# Patient Record
Sex: Female | Born: 1940
Health system: Southern US, Community
[De-identification: ages and names within clinical notes are randomized; demographics above are authoritative.]

## PROBLEM LIST (undated history)

## (undated) DIAGNOSIS — R55 Syncope and collapse: Secondary | ICD-10-CM

## (undated) DIAGNOSIS — M810 Age-related osteoporosis without current pathological fracture: Secondary | ICD-10-CM

## (undated) DIAGNOSIS — G309 Alzheimer's disease, unspecified: Secondary | ICD-10-CM

## (undated) DIAGNOSIS — M8580 Other specified disorders of bone density and structure, unspecified site: Secondary | ICD-10-CM

## (undated) DIAGNOSIS — R3981 Functional urinary incontinence: Secondary | ICD-10-CM

## (undated) DIAGNOSIS — R296 Repeated falls: Secondary | ICD-10-CM

## (undated) DIAGNOSIS — E785 Hyperlipidemia, unspecified: Secondary | ICD-10-CM

## (undated) DIAGNOSIS — F341 Dysthymic disorder: Secondary | ICD-10-CM

## (undated) DIAGNOSIS — F32A Depression, unspecified: Secondary | ICD-10-CM

## (undated) DIAGNOSIS — M1710 Unilateral primary osteoarthritis, unspecified knee: Secondary | ICD-10-CM

## (undated) DIAGNOSIS — F068 Other specified mental disorders due to known physiological condition: Secondary | ICD-10-CM

## (undated) DIAGNOSIS — F1010 Alcohol abuse, uncomplicated: Secondary | ICD-10-CM

## (undated) DIAGNOSIS — F0280 Dementia in other diseases classified elsewhere without behavioral disturbance: Secondary | ICD-10-CM

## (undated) DIAGNOSIS — K219 Gastro-esophageal reflux disease without esophagitis: Secondary | ICD-10-CM

## (undated) DIAGNOSIS — F329 Major depressive disorder, single episode, unspecified: Secondary | ICD-10-CM

## (undated) DIAGNOSIS — R413 Other amnesia: Secondary | ICD-10-CM

## (undated) DIAGNOSIS — I959 Hypotension, unspecified: Secondary | ICD-10-CM

## (undated) DIAGNOSIS — Z87442 Personal history of urinary calculi: Secondary | ICD-10-CM

## (undated) DIAGNOSIS — M25531 Pain in right wrist: Secondary | ICD-10-CM

## (undated) DIAGNOSIS — K52839 Microscopic colitis, unspecified: Secondary | ICD-10-CM

## (undated) DIAGNOSIS — Z8639 Personal history of other endocrine, nutritional and metabolic disease: Secondary | ICD-10-CM

## (undated) DIAGNOSIS — M1990 Unspecified osteoarthritis, unspecified site: Secondary | ICD-10-CM

## (undated) DIAGNOSIS — R2681 Unsteadiness on feet: Secondary | ICD-10-CM

## (undated) HISTORY — PX: TOTAL KNEE ARTHROPLASTY: SHX125

## (undated) HISTORY — DX: Other specified disorders of bone density and structure, unspecified site: M85.80

## (undated) HISTORY — DX: Microscopic colitis, unspecified: K52.839

## (undated) HISTORY — PX: TONSILLECTOMY: SHX5217

## (undated) HISTORY — DX: Other amnesia: R41.3

## (undated) HISTORY — DX: Unilateral primary osteoarthritis, unspecified knee: M17.10

## (undated) HISTORY — DX: Pain in right wrist: M25.531

## (undated) HISTORY — DX: Other specified mental disorders due to known physiological condition: F06.8

## (undated) HISTORY — DX: Dysthymic disorder: F34.1

## (undated) HISTORY — DX: Alcohol abuse, uncomplicated: F10.10

## (undated) HISTORY — DX: Unspecified osteoarthritis, unspecified site: M19.90

## (undated) HISTORY — DX: Dementia in other diseases classified elsewhere without behavioral disturbance: F02.80

## (undated) HISTORY — DX: Major depressive disorder, single episode, unspecified: F32.9

## (undated) HISTORY — PX: JOINT REPLACEMENT: SHX530

## (undated) HISTORY — DX: Alzheimer's disease, unspecified: G30.9

## (undated) HISTORY — DX: Hyperlipidemia, unspecified: E78.5

## (undated) HISTORY — PX: DILATION AND CURETTAGE OF UTERUS: SHX78

## (undated) HISTORY — DX: Age-related osteoporosis without current pathological fracture: M81.0

## (undated) HISTORY — DX: Gastro-esophageal reflux disease without esophagitis: K21.9

## (undated) HISTORY — DX: Depression, unspecified: F32.A

---

## 1998-01-09 ENCOUNTER — Ambulatory Visit (HOSPITAL_COMMUNITY): Admission: RE | Admit: 1998-01-09 | Discharge: 1998-01-09 | Payer: Self-pay | Admitting: Family Medicine

## 1998-08-09 ENCOUNTER — Other Ambulatory Visit: Admission: RE | Admit: 1998-08-09 | Discharge: 1998-08-09 | Payer: Self-pay | Admitting: Family Medicine

## 1998-10-10 ENCOUNTER — Emergency Department (HOSPITAL_COMMUNITY): Admission: EM | Admit: 1998-10-10 | Discharge: 1998-10-10 | Payer: Self-pay | Admitting: Emergency Medicine

## 1998-10-11 ENCOUNTER — Inpatient Hospital Stay (HOSPITAL_COMMUNITY): Admission: EM | Admit: 1998-10-11 | Discharge: 1998-10-17 | Payer: Self-pay | Admitting: Emergency Medicine

## 1998-10-11 ENCOUNTER — Encounter: Payer: Self-pay | Admitting: Neurology

## 1998-11-15 ENCOUNTER — Other Ambulatory Visit: Admission: RE | Admit: 1998-11-15 | Discharge: 1998-11-15 | Payer: Self-pay | Admitting: Gastroenterology

## 1999-01-01 ENCOUNTER — Other Ambulatory Visit: Admission: RE | Admit: 1999-01-01 | Discharge: 1999-01-01 | Payer: Self-pay | Admitting: Gastroenterology

## 1999-07-27 ENCOUNTER — Other Ambulatory Visit: Admission: RE | Admit: 1999-07-27 | Discharge: 1999-07-27 | Payer: Self-pay | Admitting: Family Medicine

## 1999-09-14 ENCOUNTER — Encounter: Admission: RE | Admit: 1999-09-14 | Discharge: 1999-09-14 | Payer: Self-pay | Admitting: Family Medicine

## 1999-09-14 ENCOUNTER — Encounter: Payer: Self-pay | Admitting: Family Medicine

## 1999-10-31 ENCOUNTER — Emergency Department (HOSPITAL_COMMUNITY): Admission: EM | Admit: 1999-10-31 | Discharge: 1999-10-31 | Payer: Self-pay | Admitting: Emergency Medicine

## 2001-01-08 ENCOUNTER — Other Ambulatory Visit: Admission: RE | Admit: 2001-01-08 | Discharge: 2001-01-08 | Payer: Self-pay | Admitting: Family Medicine

## 2001-02-03 ENCOUNTER — Encounter: Admission: RE | Admit: 2001-02-03 | Discharge: 2001-02-03 | Payer: Self-pay | Admitting: Family Medicine

## 2001-02-03 ENCOUNTER — Encounter: Payer: Self-pay | Admitting: Family Medicine

## 2002-01-21 ENCOUNTER — Other Ambulatory Visit: Admission: RE | Admit: 2002-01-21 | Discharge: 2002-01-21 | Payer: Self-pay | Admitting: Family Medicine

## 2002-02-02 ENCOUNTER — Encounter: Admission: RE | Admit: 2002-02-02 | Discharge: 2002-02-02 | Payer: Self-pay | Admitting: Family Medicine

## 2002-02-02 ENCOUNTER — Encounter: Payer: Self-pay | Admitting: Family Medicine

## 2002-09-09 ENCOUNTER — Inpatient Hospital Stay (HOSPITAL_COMMUNITY): Admission: EM | Admit: 2002-09-09 | Discharge: 2002-09-10 | Payer: Self-pay | Admitting: Emergency Medicine

## 2002-09-09 ENCOUNTER — Encounter: Payer: Self-pay | Admitting: Family Medicine

## 2002-10-11 ENCOUNTER — Encounter: Admission: RE | Admit: 2002-10-11 | Discharge: 2002-10-11 | Payer: Self-pay | Admitting: Family Medicine

## 2002-10-11 ENCOUNTER — Encounter: Payer: Self-pay | Admitting: Family Medicine

## 2003-01-27 ENCOUNTER — Other Ambulatory Visit: Admission: RE | Admit: 2003-01-27 | Discharge: 2003-01-27 | Payer: Self-pay | Admitting: Family Medicine

## 2003-02-04 ENCOUNTER — Encounter: Admission: RE | Admit: 2003-02-04 | Discharge: 2003-02-04 | Payer: Self-pay | Admitting: Family Medicine

## 2003-02-04 ENCOUNTER — Encounter: Payer: Self-pay | Admitting: Family Medicine

## 2004-02-02 ENCOUNTER — Other Ambulatory Visit: Admission: RE | Admit: 2004-02-02 | Discharge: 2004-02-02 | Payer: Self-pay | Admitting: Family Medicine

## 2005-06-18 ENCOUNTER — Ambulatory Visit: Payer: Self-pay | Admitting: Family Medicine

## 2005-06-25 ENCOUNTER — Encounter: Payer: Self-pay | Admitting: Family Medicine

## 2005-06-25 ENCOUNTER — Ambulatory Visit: Payer: Self-pay | Admitting: Family Medicine

## 2005-06-25 ENCOUNTER — Other Ambulatory Visit: Admission: RE | Admit: 2005-06-25 | Discharge: 2005-06-25 | Payer: Self-pay | Admitting: Family Medicine

## 2005-07-23 ENCOUNTER — Ambulatory Visit: Payer: Self-pay | Admitting: Family Medicine

## 2005-10-29 ENCOUNTER — Ambulatory Visit: Payer: Self-pay | Admitting: Family Medicine

## 2005-11-08 ENCOUNTER — Ambulatory Visit: Payer: Self-pay | Admitting: Family Medicine

## 2005-12-06 ENCOUNTER — Ambulatory Visit: Payer: Self-pay | Admitting: Family Medicine

## 2005-12-09 ENCOUNTER — Ambulatory Visit: Payer: Self-pay | Admitting: Family Medicine

## 2006-06-26 ENCOUNTER — Ambulatory Visit: Payer: Self-pay | Admitting: Family Medicine

## 2006-06-27 ENCOUNTER — Ambulatory Visit: Payer: Self-pay | Admitting: Family Medicine

## 2006-06-27 LAB — CONVERTED CEMR LAB
Albumin: 3.7 g/dL (ref 3.5–5.2)
Alkaline Phosphatase: 71 units/L (ref 39–117)
Basophils Absolute: 0.1 10*3/uL (ref 0.0–0.1)
CO2: 30 meq/L (ref 19–32)
Calcium: 9.4 mg/dL (ref 8.4–10.5)
Cholesterol: 215 mg/dL (ref 0–200)
Eosinophil percent: 5.5 % — ABNORMAL HIGH (ref 0.0–5.0)
GFR calc non Af Amer: 89 mL/min
Glucose, Bld: 89 mg/dL (ref 70–99)
HDL: 65.4 mg/dL (ref >39.0)
Hemoglobin: 13.1 g/dL (ref 12.0–15.0)
LDL DIRECT: 137 mg/dL
Lymphocytes Relative: 30 % (ref 12.0–46.0)
MCV: 95.5 fL (ref 78.0–100.0)
Monocytes Absolute: 0.5 10*3/uL (ref 0.2–0.7)
Neutro Abs: 2.4 10*3/uL (ref 1.4–7.7)
Potassium: 4.4 meq/L (ref 3.5–5.1)
RBC: 4.09 M/uL (ref 3.87–5.11)
RDW: 11.2 % — ABNORMAL LOW (ref 11.5–14.6)
Sodium: 141 meq/L (ref 135–145)
Total Protein: 6.8 g/dL (ref 6.0–8.3)
Triglyceride fasting, serum: 60 mg/dL (ref 0–149)
WBC: 4.7 10*3/uL (ref 4.5–10.5)

## 2006-07-24 ENCOUNTER — Encounter: Payer: Self-pay | Admitting: Family Medicine

## 2006-07-24 ENCOUNTER — Ambulatory Visit: Payer: Self-pay | Admitting: Family Medicine

## 2006-07-24 ENCOUNTER — Other Ambulatory Visit: Admission: RE | Admit: 2006-07-24 | Discharge: 2006-07-24 | Payer: Self-pay | Admitting: Family Medicine

## 2006-07-31 ENCOUNTER — Ambulatory Visit: Payer: Self-pay | Admitting: Family Medicine

## 2006-10-22 ENCOUNTER — Ambulatory Visit: Payer: Self-pay | Admitting: Family Medicine

## 2007-05-07 ENCOUNTER — Encounter: Admission: RE | Admit: 2007-05-07 | Discharge: 2007-05-28 | Payer: Self-pay | Admitting: Orthopedic Surgery

## 2007-05-11 DIAGNOSIS — K219 Gastro-esophageal reflux disease without esophagitis: Secondary | ICD-10-CM

## 2007-05-11 DIAGNOSIS — F329 Major depressive disorder, single episode, unspecified: Secondary | ICD-10-CM | POA: Insufficient documentation

## 2007-05-11 HISTORY — DX: Gastro-esophageal reflux disease without esophagitis: K21.9

## 2007-06-05 ENCOUNTER — Telehealth: Payer: Self-pay | Admitting: Family Medicine

## 2007-06-10 ENCOUNTER — Ambulatory Visit: Payer: Self-pay | Admitting: Family Medicine

## 2007-07-02 ENCOUNTER — Telehealth: Payer: Self-pay | Admitting: Family Medicine

## 2007-07-06 ENCOUNTER — Telehealth: Payer: Self-pay | Admitting: Family Medicine

## 2007-07-07 ENCOUNTER — Ambulatory Visit: Payer: Self-pay | Admitting: Family Medicine

## 2007-07-31 ENCOUNTER — Inpatient Hospital Stay (HOSPITAL_COMMUNITY): Admission: RE | Admit: 2007-07-31 | Discharge: 2007-08-04 | Payer: Self-pay | Admitting: Orthopedic Surgery

## 2007-08-27 ENCOUNTER — Encounter: Admission: RE | Admit: 2007-08-27 | Discharge: 2007-10-09 | Payer: Self-pay | Admitting: Orthopedic Surgery

## 2007-09-07 ENCOUNTER — Ambulatory Visit: Payer: Self-pay | Admitting: Family Medicine

## 2007-11-23 ENCOUNTER — Telehealth (INDEPENDENT_AMBULATORY_CARE_PROVIDER_SITE_OTHER): Payer: Self-pay | Admitting: *Deleted

## 2007-12-16 ENCOUNTER — Encounter: Payer: Self-pay | Admitting: Family Medicine

## 2007-12-17 ENCOUNTER — Telehealth: Payer: Self-pay | Admitting: Family Medicine

## 2007-12-17 ENCOUNTER — Other Ambulatory Visit: Admission: RE | Admit: 2007-12-17 | Discharge: 2007-12-17 | Payer: Self-pay | Admitting: Family Medicine

## 2007-12-17 ENCOUNTER — Encounter: Payer: Self-pay | Admitting: Family Medicine

## 2007-12-17 ENCOUNTER — Ambulatory Visit: Payer: Self-pay | Admitting: Family Medicine

## 2007-12-17 DIAGNOSIS — E785 Hyperlipidemia, unspecified: Secondary | ICD-10-CM | POA: Insufficient documentation

## 2007-12-17 DIAGNOSIS — R413 Other amnesia: Secondary | ICD-10-CM | POA: Insufficient documentation

## 2007-12-17 DIAGNOSIS — M810 Age-related osteoporosis without current pathological fracture: Secondary | ICD-10-CM

## 2007-12-17 HISTORY — DX: Age-related osteoporosis without current pathological fracture: M81.0

## 2007-12-17 LAB — CONVERTED CEMR LAB
AST: 18 units/L (ref 0–37)
Alkaline Phosphatase: 61 units/L (ref 39–117)
BUN: 12 mg/dL (ref 6–23)
Basophils Absolute: 0 10*3/uL (ref 0.0–0.1)
Bilirubin Urine: NEGATIVE
CO2: 30 meq/L (ref 19–32)
Cholesterol: 339 mg/dL (ref 0–200)
Creatinine, Ser: 0.7 mg/dL (ref 0.4–1.2)
Direct LDL: 251.6 mg/dL
Eosinophils Absolute: 0.1 10*3/uL (ref 0.0–0.7)
Eosinophils Relative: 3.4 % (ref 0.0–5.0)
Folate: 8 ng/mL
Glucose, Bld: 88 mg/dL (ref 70–99)
Glucose, Urine, Semiquant: NEGATIVE
HDL: 69.2 mg/dL (ref >39.0)
Hemoglobin: 13 g/dL (ref 12.0–15.0)
MCHC: 32.8 g/dL (ref 30.0–36.0)
MCV: 91.4 fL (ref 78.0–100.0)
Potassium: 4.3 meq/L (ref 3.5–5.1)
RBC: 4.33 M/uL (ref 3.87–5.11)
Sodium: 138 meq/L (ref 135–145)
T3, Free: 3 pg/mL (ref 2.3–4.2)
T4, Total: 7.5 ug/dL (ref 5.0–12.5)
Total CHOL/HDL Ratio: 4.9
Triglycerides: 68 mg/dL (ref 0–149)
VLDL: 14 mg/dL (ref 0–40)
Vitamin B-12: 316 pg/mL (ref 211–911)
WBC: 3.7 10*3/uL — ABNORMAL LOW (ref 4.5–10.5)

## 2007-12-25 ENCOUNTER — Encounter: Admission: RE | Admit: 2007-12-25 | Discharge: 2007-12-25 | Payer: Self-pay | Admitting: Family Medicine

## 2008-01-14 ENCOUNTER — Ambulatory Visit: Payer: Self-pay | Admitting: Gastroenterology

## 2008-01-15 ENCOUNTER — Ambulatory Visit: Payer: Self-pay | Admitting: Family Medicine

## 2008-01-15 DIAGNOSIS — E7800 Pure hypercholesterolemia, unspecified: Secondary | ICD-10-CM | POA: Insufficient documentation

## 2008-01-15 LAB — CONVERTED CEMR LAB
Direct LDL: 127.9 mg/dL
HDL: 65 mg/dL (ref >39.0)
Triglycerides: 55 mg/dL (ref 0–149)

## 2008-01-19 ENCOUNTER — Telehealth (INDEPENDENT_AMBULATORY_CARE_PROVIDER_SITE_OTHER): Payer: Self-pay | Admitting: *Deleted

## 2008-01-22 ENCOUNTER — Ambulatory Visit: Payer: Self-pay | Admitting: Family Medicine

## 2008-05-24 ENCOUNTER — Ambulatory Visit: Payer: Self-pay | Admitting: Family Medicine

## 2008-07-20 ENCOUNTER — Ambulatory Visit: Payer: Self-pay | Admitting: Family Medicine

## 2008-07-20 DIAGNOSIS — N390 Urinary tract infection, site not specified: Secondary | ICD-10-CM | POA: Insufficient documentation

## 2008-07-20 DIAGNOSIS — F068 Other specified mental disorders due to known physiological condition: Secondary | ICD-10-CM

## 2008-07-20 HISTORY — DX: Other specified mental disorders due to known physiological condition: F06.8

## 2008-07-20 LAB — CONVERTED CEMR LAB
Bilirubin Urine: NEGATIVE
Protein, U semiquant: 30
Urobilinogen, UA: 0.2
pH: 6

## 2008-08-01 ENCOUNTER — Ambulatory Visit: Payer: Self-pay | Admitting: Family Medicine

## 2008-08-01 LAB — CONVERTED CEMR LAB
Bilirubin Urine: NEGATIVE
Ketones, urine, test strip: NEGATIVE
Urobilinogen, UA: 0.2
pH: 6.5

## 2008-08-22 ENCOUNTER — Encounter: Payer: Self-pay | Admitting: Family Medicine

## 2008-08-22 ENCOUNTER — Ambulatory Visit: Payer: Self-pay | Admitting: Internal Medicine

## 2008-09-15 ENCOUNTER — Telehealth: Payer: Self-pay | Admitting: *Deleted

## 2008-12-26 ENCOUNTER — Encounter: Admission: RE | Admit: 2008-12-26 | Discharge: 2008-12-26 | Payer: Self-pay | Admitting: Family Medicine

## 2008-12-26 LAB — HM MAMMOGRAPHY: HM Mammogram: NEGATIVE

## 2010-01-09 ENCOUNTER — Encounter: Admission: RE | Admit: 2010-01-09 | Discharge: 2010-01-09 | Payer: Self-pay | Admitting: Physician Assistant

## 2010-01-26 ENCOUNTER — Emergency Department (HOSPITAL_COMMUNITY): Admission: EM | Admit: 2010-01-26 | Discharge: 2010-01-26 | Payer: Self-pay | Admitting: Emergency Medicine

## 2010-05-23 ENCOUNTER — Ambulatory Visit: Payer: Self-pay | Admitting: Family Medicine

## 2010-05-23 DIAGNOSIS — F0281 Dementia in other diseases classified elsewhere with behavioral disturbance: Secondary | ICD-10-CM

## 2010-05-23 DIAGNOSIS — F325 Major depressive disorder, single episode, in full remission: Secondary | ICD-10-CM | POA: Insufficient documentation

## 2010-05-23 DIAGNOSIS — F341 Dysthymic disorder: Secondary | ICD-10-CM

## 2010-05-23 DIAGNOSIS — E782 Mixed hyperlipidemia: Secondary | ICD-10-CM | POA: Insufficient documentation

## 2010-05-23 DIAGNOSIS — M818 Other osteoporosis without current pathological fracture: Secondary | ICD-10-CM | POA: Insufficient documentation

## 2010-05-23 DIAGNOSIS — F0280 Dementia in other diseases classified elsewhere without behavioral disturbance: Secondary | ICD-10-CM

## 2010-05-23 DIAGNOSIS — G301 Alzheimer's disease with late onset: Secondary | ICD-10-CM

## 2010-05-23 DIAGNOSIS — IMO0002 Reserved for concepts with insufficient information to code with codable children: Secondary | ICD-10-CM

## 2010-05-23 HISTORY — DX: Dementia in other diseases classified elsewhere, unspecified severity, without behavioral disturbance, psychotic disturbance, mood disturbance, and anxiety: F02.80

## 2010-05-23 HISTORY — DX: Dysthymic disorder: F34.1

## 2010-05-23 HISTORY — DX: Reserved for concepts with insufficient information to code with codable children: IMO0002

## 2010-06-04 ENCOUNTER — Encounter (INDEPENDENT_AMBULATORY_CARE_PROVIDER_SITE_OTHER): Payer: Self-pay | Admitting: *Deleted

## 2010-06-26 ENCOUNTER — Encounter (INDEPENDENT_AMBULATORY_CARE_PROVIDER_SITE_OTHER): Payer: Self-pay | Admitting: *Deleted

## 2010-07-02 ENCOUNTER — Ambulatory Visit: Payer: Self-pay | Admitting: Gastroenterology

## 2010-07-16 ENCOUNTER — Ambulatory Visit: Payer: Self-pay | Admitting: Gastroenterology

## 2010-09-09 ENCOUNTER — Encounter: Payer: Self-pay | Admitting: Family Medicine

## 2010-09-18 NOTE — Miscellaneous (Signed)
Summary: [facility]  Clinical Lists Changes  Medications: Added new medication of MOVIPREP 100 GM  SOLR (PEG-KCL-NACL-NASULF-NA ASC-C) As per prep instructions. - Signed Rx of MOVIPREP 100 GM  SOLR (PEG-KCL-NACL-NASULF-NA ASC-C) As per prep instructions.;  #1 x 0;  Signed;  Entered by: Ezra Sites RN;  Authorized by: Louis Meckel MD;  Method used: [facility] [phone]*, [address] [place], [place]  [address], Ph: [phone] or [phone], Fax: [phone] Observations: Added new observation of NKA: T (07/02/2010 12:43)    Prescriptions: MOVIPREP 100 GM  SOLR (PEG-KCL-NACL-NASULF-NA ASC-C) As per prep instructions.  #1 x 0   Entered by:   Ezra Sites RN   Authorized by:   Louis Meckel MD   Signed by:   Ezra Sites RN on 07/02/2010   Method used:   [facility]ronically to        [facility] [phone]* (retail)       [address]       [place], [place]  [address]       Ph: [phone] or [phone]       Fax: [phone]   RxID:   [id number]

## 2010-09-18 NOTE — Assessment & Plan Note (Signed)
Summary: BRAND NEW PT/TO EST/OK PER DR TODD AND DR BLYTH/CJR   Vital Signs:  Patient profile:   70 year old female Height:      62.5 inches (158.75 cm) Weight:      126.31 pounds (57.41 kg) BMI:     22.82 O2 Sat:      98 % on Room air Temp:     97.9 degrees F (36.61 degrees C) oral Pulse rate:   75 / minute BP sitting:   112 / 64  (left arm) Cuff size:   regular  Vitals Entered By: Josph Macho RMA (May 23, 2010 2:41 PM)  O2 Flow:  Room air CC: Establish new patient/ Was seeing Dr Tawanna Cooler and wanted a women/ CF Is Patient Diabetic? No   History of Present Illness: Patient in today for new patient appointment. Her last physician was at [facility] and that physician has left the area so she is in need of a new physician. No acute c/o. She is aware of the fact that it has been almost 10 years since her last colonoscopy and she is in need of another one. Would like to have this colonoscopy done with [place]. She believes she is essentially up to date with her other health screening exams and we will request records to confirm She has had both the pneumonia and the Shingles shot. She feels well today. No recent illness/f/c/cough.congestion/fatigue/CP/palp/SOB/GI or GU c/o. She has been struggling with dementia for years and is on Donepezil. She sees a neurologist but does not remember her name. She says she has good days and bad days but still manages to live at home with her husband. Her only other concern is that she reports she has had 4 falls in the past 1 1/2 years for unclear reasons. She reports she has no warning, she is just walking and she falls forward. She feels maybe she catches her feet on something but she is unsure. She denies any vertigo/pain/weakness/presyncope/syncope/confusion and cannot explain why this happens  Preventive Screening-Counseling & Management  Alcohol-Tobacco     Smoking Status: never      Drug Use:  no.    Current Problems (verified): 1)   Accidental Falls, Recurrent  (ICD-E888.9) 2)  Osteoarthritis, Knee  (ICD-715.96) 3)  Anxiety Depression  (ICD-300.4) 4)  Other Osteoporosis  (ICD-733.09) 5)  Mixed Hyperlipidemia  (ICD-272.2) 6)  Alzheimer's Disease  (ICD-331.0)  Current Medications (verified): 1)  Donepezil Hcl 10 Mg Tabs (Donepezil Hcl) .Marland Kitchen.. 1 Tab By Mouth Daily 2)  Alendronate Sodium 70 Mg Tabs (Alendronate Sodium) .... Take 1 Tablet By Mouth Q Week 3)  Simvastatin 20 Mg Tabs (Simvastatin) .... Take 1 Tablet By Mouth At Bedtime 4)  Citalopram Hydrobromide 40 Mg Tabs (Citalopram Hydrobromide) .... Take 1/2 Tablet Once Daily 5)  Advil .... 2 Daily 6)  Calcium 600 .... 2 Daily 7)  D3- 1000 Units .Marland KitchenMarland Kitchen. 1 Daily  Allergies (verified): No Known Drug Allergies  Past History:  Past Surgical History: Total knee replacement, right Tonsillectomy  Family History: Father: deceased@37 , accidental death Mother: deceased@86 , anemia, smoker, Alzheimer's Dementia, emphysema Siblings:  M1/2 sister: 96, morbidly obese M1/2sister: 59, cigarette smoker, emphysema M1/2 brother: 76, cigarette smoker, CAD s/p MI M1/2 brother: 46, [employer] MGM: deceased@88 , old age MGF: deceased@90 , old age PGM: deceased older in 77s PGF: deceased older in 61s Children: Daughter: 77, obese, poor health, depression Son: 70, alcoholism, depression, violent  Social History: Never Smoked Married Retired  worked at [employer] and [employer]  [employer] Alcohol use-no, quit because she had a problem with alcohol and has abstained for years Drug use-no Uses seat belt regularlySmoking Status:  never Drug Use:  no  Review of Systems  The patient denies anorexia, fever, weight loss, weight gain, vision loss, decreased hearing, hoarseness, chest pain, syncope, dyspnea on exertion, peripheral edema, prolonged cough, headaches, hemoptysis, abdominal pain, melena, hematochezia, severe indigestion/heartburn, hematuria, incontinence, muscle weakness, suspicious skin lesions,  transient blindness, difficulty walking, depression, unusual weight change, abnormal bleeding, and enlarged lymph nodes.         Flu Vaccine Consent Questions     Do you have a history of severe allergic reactions to this vaccine? no    Any prior history of allergic reactions to egg and/or gelatin? no    Do you have a sensitivity to the preservative Thimersol? no    Do you have a past history of Guillan-Barre Syndrome? no    Do you currently have an acute febrile illness? no    Have you ever had a severe reaction to latex? no    Vaccine information given and explained to patient? yes    Are you currently pregnant? no    Lot Number:[id number]   Exp Date:02/16/2011   Site Given  Left Deltoid IM Josph Macho RMA  May 23, 2010 2:59 PM   Physical Exam  General:  Well-developed,well-nourished,in no acute distress; alert,appropriate and cooperative throughout examination Head:  Normocephalic and atraumatic without obvious abnormalities. No apparent alopecia or balding. Eyes:  No corneal or conjunctival inflammation noted. EOMI. Perrla. Funduscopic exam benign, without hemorrhages, exudates or papilledema. Vision grossly normal. Ears:  External ear exam shows no significant lesions or deformities.  Otoscopic examination reveals clear canals, tympanic membranes are intact bilaterally without bulging, retraction, inflammation or discharge. Hearing is grossly normal bilaterally. Nose:  External nasal examination shows no deformity or inflammation. Nasal mucosa are pink and moist without lesions or exudates. Mouth:  Oral mucosa and oropharynx without lesions or exudates.  Teeth in good repair. Neck:  No deformities, masses, or tenderness noted. Lungs:  Normal respiratory effort, chest expands symmetrically. Lungs are clear to auscultation, no crackles or wheezes. Heart:  Normal rate and regular rhythm. S1 and S2 normal without gallop, murmur, click, rub or other extra sounds. Abdomen:  Bowel  sounds positive,abdomen soft and non-tender without masses, organomegaly or hernias noted. Msk:  No deformity or scoliosis noted of thoracic or lumbar spine.   Pulses:  R and L carotid, dorsalis pedis pulses are full and equal bilaterally Extremities:  No clubbing, cyanosis, edema, or deformity noted with normal full range of motion of all joints.   Neurologic:  No cranial nerve deficits noted. Station and gait are normal. Plantar reflexes are down-going bilaterally. DTRs are symmetrical throughout. Sensory, motor and coordinative functions appear intact.alert & oriented X3 today Skin:  Intact without suspicious lesions or rashes Cervical Nodes:  No lymphadenopathy noted Psych:  Cognition and judgment appear intact. Alert and cooperative with normal attention span and concentration. No apparent delusions, illusions, hallucinations   Impression & Recommendations:  Problem # 1:  ACCIDENTAL FALLS, RECURRENT (ICD-E888.9) Unclear etiology, will request records from previous physician and patient will supply [place] with her neurologists name so we can consider a referral for evaluation if this happens again. Will also check some labs including TSH, CBC to further evaluate  Problem # 2:  MIXED HYPERLIPIDEMIA (ICD-272.2)  His updated medication list for this problem includes:    Simvastatin 20 Mg Tabs (  Simvastatin) .Marland Kitchen... Take 1 tablet by mouth at bedtime Check an FLP prior to next visit. Avoid trans fats  Problem # 3:  OTHER OSTEOPOROSIS (ICD-733.09)  His updated medication list for this problem includes:    Alendronate Sodium 70 Mg Tabs (Alendronate sodium) .Marland Kitchen... Take 1 tablet by mouth q week Await records to evaluate when next bone density study is due  Problem # 4:  ALZHEIMER'S DISEASE (ICD-331.0) Continue Donepezil and f/u with Neurology  Problem # 5:  Preventive Health Care (ICD-V70.0)  Will refer for screening colonoscopy  Orders: Gastroenterology Referral (GI)  Complete Medication  List: 1)  Donepezil Hcl 10 Mg Tabs (Donepezil hcl) .Marland Kitchen.. 1 tab by mouth daily 2)  Alendronate Sodium 70 Mg Tabs (Alendronate sodium) .... Take 1 tablet by mouth q week 3)  Simvastatin 20 Mg Tabs (Simvastatin) .... Take 1 tablet by mouth at bedtime 4)  Citalopram Hydrobromide 40 Mg Tabs (Citalopram hydrobromide) .... Take 1/2 tablet once daily 5)  Advil  .... 2 daily 6)  Calcium 600  .... 2 daily 7)  D3- 1000 Units  .Marland Kitchen.. 1 daily  Other Orders: Flu Vaccine 34yrs + MEDICARE PATIENTS (Z6109) Administration Flu vaccine - MCR (U0454)  Patient Instructions: 1)  Please schedule a follow-up appointment in 1 month.  2)  BMP prior to visit, ICD-9: E888.9 3)  Hepatic Panel prior to visit ICD-9: 294.8 4)  Lipid panel prior to visit ICD-9 : 272.4 5)  TSH prior to visit ICD-9 : 294.8 6)  CBC w/ Diff prior to visit ICD-9 : 294.8 7)  Release of Records from [facility]  Preventive Care Screening  Last Flu Shot:    Date:  05/19/2009    Results:  historical   Last Tetanus Booster:    Date:  08/19/2008    Results:  Historical   Last Pneumovax:    Date:  08/19/2004    Results:  historical   Colonoscopy:    Date:  08/19/2000    Results:  historical

## 2010-09-18 NOTE — Letter (Signed)
Summary: [facility] Instructions  [facility]  [address] [place], [place] [address]   Phone: [phone]  Fax: [phone]       Kim Hernandez    [birth year]    MRN: [id number]        Procedure Day /Date:  Monday 07/16/2010     Arrival Time:  9:00 am      Procedure Time: 10:00 am     Location of Procedure:                    _ x_  [facility] (4th Floor)                        PREPARATION FOR COLONOSCOPY WITH MOVIPREP   Starting 5 days prior to your procedure Wednesday 11/28 do not eat nuts, seeds, popcorn, corn, beans, peas,  salads, or any raw vegetables.  Do not take any fiber supplements (e.g. Metamucil, Citrucel, and Benefiber).  THE DAY BEFORE YOUR PROCEDURE         DATE: Sunday 11/27  1.  Drink clear liquids the entire day-NO SOLID FOOD  2.  Do not drink anything colored red or purple.  Avoid juices with pulp.  No orange juice.  3.  Drink at least 64 oz. (8 glasses) of fluid/clear liquids during the day to prevent dehydration and help the prep work efficiently.  CLEAR LIQUIDS INCLUDE: Water Jello Ice Popsicles Tea (sugar ok, no milk/cream) Powdered fruit flavored drinks Coffee (sugar ok, no milk/cream) Gatorade Juice: apple, white grape, white cranberry  Lemonade Clear bullion, consomm, broth Carbonated beverages (any kind) Strained chicken noodle soup Hard Candy                             4.  In the morning, mix first dose of MoviPrep solution:    Empty 1 Pouch A and 1 Pouch B into the disposable container    Add lukewarm drinking water to the top line of the container. Mix to dissolve    Refrigerate (mixed solution should be used within 24 hrs)  5.  Begin drinking the prep at 5:00 p.m. The MoviPrep container is divided by 4 marks.   Every 15 minutes drink the solution down to the next mark (approximately 8 oz) until the full liter is complete.   6.  Follow completed prep with 16 oz of clear liquid of your choice  (Nothing red or purple).  Continue to drink clear liquids until bedtime.  7.  Before going to bed, mix second dose of MoviPrep solution:    Empty 1 Pouch A and 1 Pouch B into the disposable container    Add lukewarm drinking water to the top line of the container. Mix to dissolve    Refrigerate  THE DAY OF YOUR PROCEDURE      DATE: Monday 11/28  Beginning at 5:00 a.m. (5 hours before procedure):         1. Every 15 minutes, drink the solution down to the next mark (approx 8 oz) until the full liter is complete.  2. Follow completed prep with 16 oz. of clear liquid of your choice.    3. You may drink clear liquids until 8:00 am (2 HOURS BEFORE PROCEDURE).   MEDICATION INSTRUCTIONS  Unless otherwise instructed, you should take regular prescription medications with a small sip of water   as early as possible the morning  of your procedure.           OTHER INSTRUCTIONS  You will need a responsible adult at least 71 years of age to accompany you and drive you home.   This person must remain in the waiting room during your procedure.  Wear loose fitting clothing that is easily removed.  Leave jewelry and other valuables at home.  However, you may wish to bring a book to read or  an iPod/MP3 player to listen to music as you wait for your procedure to start.  Remove all body piercing jewelry and leave at home.  Total time from sign-in until discharge is approximately 2-3 hours.  You should go home directly after your procedure and rest.  You can resume normal activities the  day after your procedure.  The day of your procedure you should not:   Drive   Make legal decisions   Operate machinery   Drink alcohol   Return to work  You will receive specific instructions about eating, activities and medications before you leave.    The above instructions have been reviewed and explained to me by   Ezra Sites RN  July 02, 2010 1:29 PM     I fully understand  and can verbalize these instructions _____________________________ Date _________

## 2010-09-18 NOTE — Procedures (Signed)
Summary: Colonoscopy  Patient: Kim Hernandez Note: All result statuses are Final unless otherwise noted.  Tests: (1) Colonoscopy (COL)   COL Colonoscopy           DONE     [facility]     [address]     [place], [place]  [address]           COLONOSCOPY PROCEDURE REPORT           PATIENT:  Kim, Hernandez  MR#:  [id number]     BIRTHDATE:  1941-08-19, 69 yrs. old  GENDER:  female           ENDOSCOPIST:  Barbette Hair. Arlyce Dice, MD     Referred by:           PROCEDURE DATE:  07/16/2010     PROCEDURE:  Diagnostic Colonoscopy     ASA CLASS:  Class II     INDICATIONS:  1) Routine Risk Screening           MEDICATIONS:   Fentanyl 50 mcg IV, Versed 5 mg IV           DESCRIPTION OF PROCEDURE:   After the risks benefits and     alternatives of the procedure were thoroughly explained, informed     consent was obtained.  Digital rectal exam was performed and     revealed no abnormalities.   The LB [id number] endoscope     was introduced through the anus and advanced to the cecum, which     was identified by the ileocecal valve, without limitations.  The     quality of the prep was excellent, using MoviPrep.  The instrument     was then slowly withdrawn as the colon was fully examined.     <<PROCEDUREIMAGES>>           FINDINGS:  A normal appearing cecum, ileocecal valve, and     appendiceal orifice were identified. The ascending, hepatic     flexure, transverse, splenic flexure, descending, sigmoid colon,     and rectum appeared unremarkable (see image1, image3, image4,     image5, image6, and image7).   Retroflexed views in the rectum     revealed no abnormalities.    The time to cecum =  3.25  minutes.     The scope was then withdrawn (time =  7.0  min) from the patient     and the procedure completed.           COMPLICATIONS:  None           ENDOSCOPIC IMPRESSION:     1) Normal colon     RECOMMENDATIONS:     1) Continue current colorectal screening recommendations  for     "routine risk" patients with a repeat colonoscopy in 10 years.           REPEAT EXAM:   10 year(s) Colonoscopy           ______________________________     Barbette Hair. Arlyce Dice, MD           CC: Roderick Pee, MD           n.     Rosalie Doctor:   Barbette Hair. Kaplan at 07/16/2010 10:47 AM           Ina Kick, [id number]  Note: An exclamation mark (!) indicates a result that was not dispersed into the flowsheet. Document Creation Date: 07/16/2010 10:48 AM _______________________________________________________________________  (1)  Order result status: Final Collection or observation date-time: 07/16/2010 10:43 Requested date-time:  Receipt date-time:  Reported date-time:  Referring Physician:   Ordering Physician: Melvia Heaps [phone]) Specimen Source:  Source: Launa Grill Order Number: [id number] Lab site:   Appended Document: Colonoscopy    Clinical Lists Changes  Observations: Added new observation of COLONNXTDUE: 06/2020 (07/16/2010 10:51)

## 2010-09-18 NOTE — Letter (Signed)
Summary: Pre Visit Letter Revised  [facility]  [address] [place], [place] [address]   Phone: [phone]  Fax: [phone]        06/04/2010 MRN: [id number] [facility] [address] [place], [place]  [address]             Procedure Date:  07-16-10   Welcome to the Gastroenterology Division at [facility].    You are scheduled to see a nurse for your pre-procedure visit on 07-02-10 at 1:00P.M. on the 3rd floor at [facility], [address]  We ask that you try to arrive at our office 15 minutes prior to your appointment time to allow for check-in.  Please take a minute to review the attached form.  If you answer "Yes" to one or more of the questions on the first page, we ask that you call the person listed at your earliest opportunity.  If you answer "No" to all of the questions, please complete the rest of the form and bring it to your appointment.    Your nurse visit will consist of discussing your medical and surgical history, your immediate family medical history, and your medications.   If you are unable to list all of your medications on the form, please bring the medication bottles to your appointment and we will list them.  We will need to be aware of both prescribed and over the counter drugs.  We will need to know exact dosage information as well.    Please be prepared to read and sign documents such as consent forms, a financial agreement, and acknowledgement forms.  If necessary, and with your consent, a friend or relative is welcome to sit-in on the nurse visit with you.  Please bring your insurance card so that we may make a copy of it.  If your insurance requires a referral to see a specialist, please bring your referral form from your primary care physician.  No co-pay is required for this nurse visit.     If you cannot keep your appointment, please call [phone] to cancel or reschedule prior to your appointment date.  This  allows [place] the opportunity to schedule an appointment for another patient in need of care.    Thank you for choosing [facility] for your medical needs.  We appreciate the opportunity to care for you.  Please visit [place] at our website  to learn more about our practice.  Sincerely, The Gastroenterology Division

## 2010-11-05 LAB — POCT I-STAT, CHEM 8
Calcium, Ion: 1.13 mmol/L (ref 1.12–1.32)
Creatinine, Ser: 0.8 mg/dL (ref 0.4–1.2)
Glucose, Bld: 115 mg/dL — ABNORMAL HIGH (ref 70–99)
Hemoglobin: 13.9 g/dL (ref 12.0–15.0)

## 2010-11-05 LAB — URINALYSIS, ROUTINE W REFLEX MICROSCOPIC
Bilirubin Urine: NEGATIVE
Ketones, ur: NEGATIVE mg/dL
Leukocytes, UA: NEGATIVE
Nitrite: NEGATIVE
Protein, ur: NEGATIVE mg/dL
Urobilinogen, UA: 0.2 mg/dL (ref 0.0–1.0)

## 2010-11-05 LAB — URINE MICROSCOPIC-ADD ON

## 2010-11-06 ENCOUNTER — Other Ambulatory Visit (HOSPITAL_COMMUNITY): Payer: Self-pay | Admitting: Neurology

## 2010-11-06 DIAGNOSIS — R413 Other amnesia: Secondary | ICD-10-CM

## 2010-11-12 ENCOUNTER — Other Ambulatory Visit: Payer: Self-pay

## 2010-11-12 ENCOUNTER — Ambulatory Visit (HOSPITAL_COMMUNITY)
Admission: RE | Admit: 2010-11-12 | Discharge: 2010-11-12 | Disposition: A | Discharge status: Home / Self Care | Payer: No Typology Code available for payment source | Source: Ambulatory Visit | Attending: Neurology | Admitting: Neurology

## 2010-11-12 DIAGNOSIS — R413 Other amnesia: Secondary | ICD-10-CM | POA: Insufficient documentation

## 2010-11-12 LAB — GLUCOSE, CAPILLARY: Glucose-Capillary: 91 mg/dL (ref 70–99)

## 2010-11-12 MED ORDER — SIMVASTATIN 20 MG PO TABS: 20.0000 mg | ORAL_TABLET | Freq: Every evening | ORAL | Status: DC

## 2010-12-03 ENCOUNTER — Other Ambulatory Visit: Payer: Self-pay

## 2010-12-04 MED ORDER — CITALOPRAM HYDROBROMIDE 40 MG PO TABS: 40.0000 mg | ORAL_TABLET | Freq: Every day | ORAL | Status: DC

## 2010-12-17 ENCOUNTER — Ambulatory Visit: Payer: Medicare Other | Admitting: Family Medicine

## 2011-01-01 NOTE — Discharge Summary (Signed)
NAME:  NALY, SCHWANZ              ACCOUNT NO.:  [id number]   MEDICAL RECORD NO.:  [id number]          PATIENT TYPE:  INP   LOCATION:  1615                         FACILITY:  [facility]   PHYSICIAN:  Ollen Gross, M.D.    DATE OF BIRTH:  29-May-1941   DATE OF ADMISSION:  07/31/2007  DATE OF DISCHARGE:  08/04/2007                               DISCHARGE SUMMARY   ADMITTING DIAGNOSES:  1. Osteoarthritis right knee.  2. History of headaches.  3. Past history of depression.  4. Past history of bronchitis.  5. Past history of pneumonia.  6. History of renal calculi.  7. Osteoporosis.  8. Arthritis.   DISCHARGE DIAGNOSES:  1. Osteoarthritis right knee status post right total knee replacement      arthroplasty.  2. Postoperative acute blood loss anemia (no transfusion).  3. Mild postoperative hyponatremia, improved.  4. History of headaches.  5. Past history of depression.  6. Past history of bronchitis.  7. Past history of pneumonia.  8. History of renal calculi.  9. Osteoporosis.  10.Arthritis.   PROCEDURE:  July 31, 2007:  Right total knee.  Surgeon:  Dr.  Lequita Halt.  Assistant:  Avel Peace, PA-C.  Anesthesia:  Spinal with  Duramorph.   CONSULTS:  None.   BRIEF HISTORY:  Ms. Heckart is a 70 year old female with end-stage  arthritis of the right knee with progressive worsening pain and  dysfunction, failed nonoperative management, now presents for total knee  arthroplasty.   LABORATORY DATA:  Preoperative CBC showed a hemoglobin of 13.5,  hematocrit of 40.5, white cell count 5.2.  Chem panel on admission all  within normal limits.  PT/INR 12.4 and 0.9 preoperatively with an  elevated PTT of 39.  Preoperative UA:  Moderate leukocyte esterase, only  3-6 white cells, 0-2 red cells, and many bacteria.  Serial CBCs were  followed.  Hemoglobin postoperatively 8.9, drifted down to 8.7, then got  as low as 7.8.  She was asymptomatic.  It was coming back up; she was  already back  up to 8.0.  Serial pro times followed.  Last noted PT/INR  17.9 and 1.4.  BMETs were followed.  Sodium did drop down to 131, back  up to 135.  Remaining electrolytes remained within normal limits.   X-rays:  Chest x-ray July 28, 2007:  No active lung disease.   EKG on July 28, 2007:  Normal sinus rhythm, incomplete right bundle-  branch block, unconfirmed.   HOSPITAL COURSE:  The patient was admitted to [facility] and  taken to the OR, underwent above-stated procedure without complication.  The patient tolerated the procedure well, later transferred to the  recovery room and the orthopedic floor.  Started on PCA and p.o.  analgesic for pain control following surgery, had a Autovac Hemovac used  at time of surgery.  Hemovac drain was pulled on postoperative day #1.  Did pretty well on that evening and into the morning of next day,  started getting up out of bed.  Weaned over to p.o. medications.  The  patient was a known Jehovah's Witness and did not  want any blood  products.  She was started on iron.  Postoperative hemoglobin was down  to 8.9.  She was asymptomatic with this, started getting up, and by day  #2 was already walking 150 feet.  Dressing was changed on day #2 and  incision looked good.  No signs of infection, encouraged ambulation,  kept the IV going.  Hemoglobin was down to 8.7.  By day #3 she was  already up walking about 200 feet even though the hemoglobin was down to  7.8.  She was asymptomatic with this.  INR was slowly titrating up to a  therapeutic level.  Due to the hemoglobin being a little bit lower we  decided to hold her another day to monitor hemoglobin and to ensure that  she did not develop any symptomatology with her low hemoglobin.  Rechecked it, and on postoperative day #4 hemoglobin had started coming  back up.  She was back up to 8.  She was tolerating her iron, walking  well, and was discharged home.   DISCHARGE PLAN:  1. The patient  was discharged home on August 04, 2007.  2. Discharge diagnoses:  Please see above.  3. Discharge medications:  Percocet, Robaxin, Nu-Iron and Coumadin.   DIET:  As tolerated.   ACTIVITY:  She is weightbearing as tolerated to the right lower  extremity.  Home health PT and home health nursing, total knee protocol.   FOLLOWUP:  Two weeks.   DISPOSITION:  Home.   CONDITION UPON DISCHARGE:  Improving.      Alexzandrew L. Perkins, P.A.C.      Ollen Gross, M.D.  Electronically Signed   ALP/MEDQ  D:  08/04/2007  T:  08/04/2007  Job:  [id number]   cc:   Tinnie Gens A. Tawanna Cooler, MD  [address] [place]  [place] [address]

## 2011-01-01 NOTE — Op Note (Signed)
NAME:  Kim Hernandez, Kim Hernandez              ACCOUNT NO.:  [id number]   MEDICAL RECORD NO.:  [id number]          PATIENT TYPE:  INP   LOCATION:  1615                         FACILITY:  [facility]   PHYSICIAN:  Ollen Gross, M.D.    DATE OF BIRTH:  Dec 23, 1940   DATE OF PROCEDURE:  DATE OF DISCHARGE:                               OPERATIVE REPORT   ADDENDUM:  The dictation number that the addendum is going to is  [id number].   In addition, the patient had symptomatic arthritis of the left knee and  at the end of the procedure we sterilely prepped the left knee and  injected it with Marcaine and Depo-Medrol with no problems.  A Band-Aid  is placed and she was awakened and transported to recovery in stable  condition.      Ollen Gross, M.D.  Electronically Signed     FA/MEDQ  D:  07/31/2007  T:  07/31/2007  Job:  [id number]

## 2011-01-01 NOTE — Op Note (Signed)
NAME:  Kim Hernandez, Kim Hernandez              ACCOUNT NO.:  [id number]   MEDICAL RECORD NO.:  [id number]          PATIENT TYPE:  INP   LOCATION:  1615                         FACILITY:  [facility]   PHYSICIAN:  Ollen Gross, M.D.    DATE OF BIRTH:  [birth year]   DATE OF PROCEDURE:  07/31/2007  DATE OF DISCHARGE:                               OPERATIVE REPORT   PREOPERATIVE DIAGNOSES:  Osteoarthritis right knee.   POSTOPERATIVE DIAGNOSES:  Osteoarthritis right knee.   PROCEDURE:  Right total knee arthroplasty.   SURGEON:  Dr. Homero Fellers Aluisio.   ASSISTANT:  Avel Peace, PA-C.   ANESTHESIA:  Spinal with Duramorph.   ESTIMATED BLOOD LOSS:  Minimal.   DRAIN:  Autovac x1.   COMPLICATIONS:  None.   CONDITION:  Stable to recovery.   CLINICAL NOTE:  Kim Hernandez is a 70 year old female with end-stage  arthritis of the right knee with progressively worsening pain and  dysfunction.  She has failed nonoperative management and presents for  total knee arthroplasty.   PROCEDURE IN DETAIL:  After successful administration of spinal  anesthetic, a tourniquet was placed high on her right thigh and right  lower extremity prepped and draped in the usual sterile fashion.  Extremities wrapped in Esmarch, knee flexed and tourniquet inflated to  300 mmHg. A midline incision was made with a 10 blade through the  subcutaneous tissue to the level of the extensor mechanism.  A fresh  blade is used to make a medial parapatellar arthrotomy.  The soft tissue  over the proximal medial tibia is subperiosteally elevated to the joint  line with a knife and into the semimembranosus bursa with a Cobb  elevator. The soft tissue over the proximal lateral tibia is elevated  with attention being paid to avoiding the patellar tendon on the tibial  tubercle. The patella is subluxed laterally, knee flexed 90 degrees and  ACL and PCL removed.  A drill was used to create a starting hole in the  distal femur.  The canal was  thoroughly irrigated and the 5 degree right  valgus alignment guide is placed.  Referencing off the posterior  condyles,  rotation is marked and the block pinned to remove 10 mm off  the distal femur.  Distal femoral resection is made with an oscillating  saw.  A sizing block is placed, size 3 is the most appropriate. Rotation  is marked off the epicondylar axis.  A size 3 cutting block is placed  and the anterior, posterior and chamfer cuts made.   The tibia is subluxed forward and menisci are removed.  The  extramedullary tibial alignment guide is placed referencing proximally  at the medial aspect of the tibial tubercle and distally along the  second metatarsal axis and tibial crest.  The block is pinned to remove  10 mm off the non deficient lateral side.  Tibial resection is made with  an oscillating saw.  Size 3 is the most appropriate tibial component and  the proximal tibia is prepared with the modular drill and keel punch for  a size 3.  Femoral preparation is completed  with the intercondylar cut.   A size 3 mobile bearing tibial trial and size 3 posterior stabilized  femoral trial and a 10 mm posterior stabilized rotating platform insert  trial are placed.  With the 10, full extension is achieved with  excellent varus and valgus balance and anterior-posterior balance  throughout full range of motion.  The patella was then everted and  thickness measured to be  22 mm.  Freehand resection is taken to 13 mm,  35 template is placed, lug holes are drilled, trial patella is placed  and it tracks normally.  Osteophytes are removed off the posterior femur  with the trial in place.  All trials are removed and the cut bone  surfaces are prepared with pulsatile lavage. The cement is mixed and  once ready for implantation, the size 3 mobile bearing tibial tray and  size 3 posterior stabilized femur and 35 patella are cemented into place  and the patella is held with the clamp.  A trial  10-mm insert is placed,  knee held in full extension and all extruded cement removed.  When the  cement is fully hardened then the permanent 10 mm posterior stabilized  rotating platform insert is placed into the tibial tray. The wound was  further irrigated and the extensor mechanism closed over the drain with  interrupted #1 PDS.  Flexion against gravity is 140 degrees.  The subcu  is closed with interrupted 2-0 Vicryl, subcuticular with running 4-0  Monocryl.  The incision is cleaned and dried and Steri-Strips and a  bulky sterile dressing applied.  The Autovac is then hooked to suction  and she is placed in the knee immobilizer, awakened and transported to  recovery in stable condition.      Ollen Gross, M.D.  Electronically Signed     FA/MEDQ  D:  07/31/2007  T:  07/31/2007  Job:  [id number]

## 2011-01-01 NOTE — H&P (Signed)
NAME:  Kim Hernandez, Kim Hernandez              ACCOUNT NO.:  [id number]   MEDICAL RECORD NO.:  [id number]          PATIENT TYPE:  INP   LOCATION:  1615                         FACILITY:  [facility]   PHYSICIAN:  Ollen Gross, M.D.    DATE OF BIRTH:  14-May-1941   DATE OF ADMISSION:  07/31/2007  DATE OF DISCHARGE:                              HISTORY & PHYSICAL   DATE OF OFFICE VISIT:  July 23, 2007   Right knee pain.   HISTORY OF PRESENT ILLNESS:  This is a 70 year old female who has been  seen by Dr. Lequita Halt for ongoing right knee pain.  She has known end  stage bone-on-bone arthritis medially.  She has undergone injections in  the past and unfortunately have not provided relief.  It is felt she  would benefit by undergoing surgical intervention.  The risks and  benefits were discussed.  She has elected to proceed with surgery.  She  has been seen preoperatively by Dr. Tawanna Cooler and felt stable for undergoing  surgery.   ALLERGIES:  NO KNOWN DRUG ALLERGIES.   MEDICATIONS:  Fosamax, calcium plus D, baby aspirin.   PAST MEDICAL HISTORY:  1. History of headaches.  2. Past history of depression.  3. Past history of bronchitis.  4. Past history of pneumonia.  5. History of renal calculi.  6. Osteoporosis.  7. Arthritis.   PAST SURGICAL HISTORY:  1. Tonsils.  2. Left knee scope.   SOCIAL HISTORY:  Married.  Homemaker.  Nonsmoker.  No alcohol.  Two  children.  Her spouse will be assisting with care after surgery.   FAMILY HISTORY:  Mother with history of arthritis.   REVIEW OF SYSTEMS:  GENERAL:  No fever, chills, or night sweats.  NEUROLOGICAL:  No seizures, syncope, or paralysis.  RESPIRATORY:  No  shortness of breath, productive cough, hemoptysis.  CARDIOVASCULAR:  No  chest pain, angina, orthopnea.  GI:  No nausea, vomiting, diarrhea, or  constipation.  GENITOURINARY:  No dysuria, hematuria, or discharge.  MUSCULOSKELETAL:  Right knee.   PHYSICAL EXAMINATION:  VITAL SIGNS:  Pulse 68,  respirations 12, blood  pressure 128/66.  GENERAL:  This is a 70 year old white thin female, slender, alert and  oriented, cooperative, pleasant.  Excellent historian.  Accompanied by  her husband.  HEENT:  Normocephalic, atraumatic.  Pupils equal, round, and reactive.  Oropharynx clear.  Extraocular movements intact.  Noted to wear glasses.  NECK:  Supple.  CHEST:  Clear anterior and posterior chest walls.  HEART:  Regular rate and rhythm with early systolic ejection murmur over  at the right border over the aortic point.  ABDOMEN:  Soft, flat, nontender.  Bowel sounds present.  RECTUM/BREASTS/GENITALIA: Not done, not pertinent to present illness.  EXTREMITIES:  Right knee.  No effusion.  Tender medially.  No  instability.   IMPRESSION:  Osteoarthritis right knee.   PLAN:  The patient admitted to [facility] to undergo a right  total knee.  Surgery will be performed by Dr. Ollen Gross.  Day  hospitalist will be consulted if needed for any medical assistance with  this patient  throughout the hospital course.      Alexzandrew L. Perkins, P.A.C.      Ollen Gross, M.D.  Electronically Signed    ALP/MEDQ  D:  08/02/2007  T:  08/03/2007  Job:  [id number]   cc:   Tinnie Gens A. Tawanna Cooler, MD  [address] [place]  [place] [address]   Ollen Gross, M.D.  Fax: [phone]

## 2011-01-04 NOTE — Discharge Summary (Signed)
NAME:  Kim Hernandez, Kim Hernandez                        ACCOUNT NO.:  [id number]   MEDICAL RECORD NO.:  [id number]                   PATIENT TYPE:  INP   LOCATION:  4713                                 FACILITY:  [facility]   PHYSICIAN:  Rollene Rotunda, M.D. [facility]            DATE OF BIRTH:  1941/06/04   DATE OF ADMISSION:  09/09/2002  DATE OF DISCHARGE:  09/10/2002                           DISCHARGE SUMMARY - REFERRING   PROCEDURES:  None.   REASON FOR ADMISSION:  The patient is a 70 year old female with no prior  history of heart disease, with cardiac risk factors notable for history of  dyslipidemia, who presented to the emergency room with symptoms atypical for  ischemic heart disease.  Please refer to dictated admission note for full  details.   LABORATORY DATA:  Serial cardiac enzymes normal.  Complete metabolic profile  normal.  CBC normal.  TSH normal.  Lipid profile pending.   Admission chest x-ray: No active disease, question left shoulder  impingement.   HOSPITAL COURSE:  The patient was admitted for overnight observation to rule  out myocardial infarction.  Serial cardiac enzymes and serial EKGs were all  within normal limits.   The patient continued to have intermittent episodes of nonexertional, left-  sided thoracic chest pain which was not exacerbated by inspiration or  movement.   Following review with Dr. Antoine Poche, recommendation was to proceed with  clearance for outpatient exercise Cardiolite testing.  Arrangements had  already been made for this to be scheduled on day of discharge, Friday  Januar6 23, at 11:30 a.m.   The patient will remain on low-dose aspirin and Vioxx as she had prior to  admission.   DISCHARGE INSTRUCTIONS:  1. The patient is scheduled for stress test Cardiolite at [facility] on Friday, September 10, 2002, at 11:30 a.m.  No food, drink or     caffeine on the morning of testing.  2. Further cardiac workup recommendations will be made  pending review of the     stress test results.  3. The patient is instructed to return to Dr. Margrett Rud for followup.     She will return to Dr. Rollene Rotunda on an as-needed basis.   DISCHARGE MEDICATIONS:  1. Aspirin 81 mg daily.  2. Vioxx 25 mg daily.   DISCHARGE DIAGNOSES:  1. Atypical chest pain.     a. Normal serial cardiac enzymes, electrocardiogram.     b.        Scheduled for outpatient exercise stress Cardiolite.  2. History of dyslipidemia.  3. Arthritis.     Gene Serpe, P.A. [facility]                      Rollene Rotunda, M.D. [facility]    GS/MEDQ  D:  09/10/2002  T:  09/10/2002  Job:  [id number]   cc:   Vale Haven. Andrey Campanile, M.D.  [phone]  [address]  [place]  [place] [address]  Fax: [phone]

## 2011-01-14 ENCOUNTER — Other Ambulatory Visit: Payer: Self-pay | Admitting: Family Medicine

## 2011-02-15 ENCOUNTER — Other Ambulatory Visit: Payer: Self-pay | Admitting: Family Medicine

## 2011-02-15 DIAGNOSIS — Z1231 Encounter for screening mammogram for malignant neoplasm of breast: Secondary | ICD-10-CM

## 2011-02-25 ENCOUNTER — Ambulatory Visit
Admission: RE | Admit: 2011-02-25 | Discharge: 2011-02-25 | Disposition: A | Discharge status: Home / Self Care | Payer: Medicare Other | Source: Ambulatory Visit | Attending: Family Medicine | Admitting: Family Medicine

## 2011-02-25 DIAGNOSIS — Z1231 Encounter for screening mammogram for malignant neoplasm of breast: Secondary | ICD-10-CM

## 2011-03-25 ENCOUNTER — Encounter: Payer: Self-pay | Admitting: Family Medicine

## 2011-04-17 ENCOUNTER — Ambulatory Visit (INDEPENDENT_AMBULATORY_CARE_PROVIDER_SITE_OTHER): Payer: Medicare Other | Admitting: Family Medicine

## 2011-04-17 ENCOUNTER — Encounter: Payer: Self-pay | Admitting: Family Medicine

## 2011-04-17 VITALS — BP 126/76 | HR 62 | Temp 98.3°F | Ht 62.5 in | Wt 123.8 lb

## 2011-04-17 DIAGNOSIS — F341 Dysthymic disorder: Secondary | ICD-10-CM

## 2011-04-17 DIAGNOSIS — IMO0002 Reserved for concepts with insufficient information to code with codable children: Secondary | ICD-10-CM

## 2011-04-17 DIAGNOSIS — R5381 Other malaise: Secondary | ICD-10-CM

## 2011-04-17 DIAGNOSIS — M810 Age-related osteoporosis without current pathological fracture: Secondary | ICD-10-CM

## 2011-04-17 DIAGNOSIS — M1710 Unilateral primary osteoarthritis, unspecified knee: Secondary | ICD-10-CM

## 2011-04-17 DIAGNOSIS — F0280 Dementia in other diseases classified elsewhere without behavioral disturbance: Secondary | ICD-10-CM

## 2011-04-17 DIAGNOSIS — Z1211 Encounter for screening for malignant neoplasm of colon: Secondary | ICD-10-CM

## 2011-04-17 DIAGNOSIS — K219 Gastro-esophageal reflux disease without esophagitis: Secondary | ICD-10-CM

## 2011-04-17 DIAGNOSIS — M25531 Pain in right wrist: Secondary | ICD-10-CM

## 2011-04-17 DIAGNOSIS — M25539 Pain in unspecified wrist: Secondary | ICD-10-CM

## 2011-04-17 DIAGNOSIS — Z79899 Other long term (current) drug therapy: Secondary | ICD-10-CM

## 2011-04-17 DIAGNOSIS — R5383 Other fatigue: Secondary | ICD-10-CM

## 2011-04-17 DIAGNOSIS — E785 Hyperlipidemia, unspecified: Secondary | ICD-10-CM

## 2011-04-17 DIAGNOSIS — Z0000 Encounter for general adult medical examination without abnormal findings: Secondary | ICD-10-CM

## 2011-04-17 HISTORY — DX: Pain in right wrist: M25.531

## 2011-04-17 LAB — CBC WITH DIFFERENTIAL/PLATELET
Basophils Absolute: 0.1 10*3/uL (ref 0.0–0.1)
Eosinophils Absolute: 0.2 10*3/uL (ref 0.0–0.7)
HCT: 38.9 % (ref 36.0–46.0)
Lymphs Abs: 1.4 10*3/uL (ref 0.7–4.0)
MCHC: 33.4 g/dL (ref 30.0–36.0)
MCV: 96.5 fl (ref 78.0–100.0)
Monocytes Absolute: 0.4 10*3/uL (ref 0.1–1.0)
Neutrophils Relative %: 51.6 % (ref 43.0–77.0)
Platelets: 201 10*3/uL (ref 150.0–400.0)
RDW: 12.6 % (ref 11.5–14.6)
WBC: 4.3 10*3/uL — ABNORMAL LOW (ref 4.5–10.5)

## 2011-04-17 LAB — RENAL FUNCTION PANEL
Creatinine, Ser: 0.7 mg/dL (ref 0.4–1.2)
Glucose, Bld: 89 mg/dL (ref 70–99)
Phosphorus: 3.1 mg/dL (ref 2.3–4.6)
Potassium: 4.7 mEq/L (ref 3.5–5.1)
Sodium: 140 mEq/L (ref 135–145)

## 2011-04-17 LAB — LIPID PANEL
Cholesterol: 217 mg/dL — ABNORMAL HIGH (ref 0–200)
HDL: 82.2 mg/dL (ref >39.00)
Total CHOL/HDL Ratio: 3
Triglycerides: 56 mg/dL (ref 0.0–149.0)

## 2011-04-17 LAB — HEPATIC FUNCTION PANEL
ALT: 11 U/L (ref 0–35)
Bilirubin, Direct: 0 mg/dL (ref 0.0–0.3)
Total Bilirubin: 0.3 mg/dL (ref 0.3–1.2)

## 2011-04-17 LAB — LDL CHOLESTEROL, DIRECT: Direct LDL: 130.9 mg/dL

## 2011-04-17 NOTE — Assessment & Plan Note (Signed)
Appears to be an overuse injury and she is done painting now. Encouraged her to rest it and apply ice then Aspercreme twice daily, if no improvement she should call for further instructions

## 2011-04-17 NOTE — Assessment & Plan Note (Signed)
Patient is following with neurology and stable on Celexa and Aricept will not make any changes to her regimen at this time.

## 2011-04-17 NOTE — Assessment & Plan Note (Signed)
Has had the right knee replaced with good results and is considering having the left knee done as well secondary to it causing her a great deal of pain, will wait for now, encouraged to stay as active as possible

## 2011-04-17 NOTE — Progress Notes (Signed)
Kim Hernandez [id number] 1941/07/14 04/17/2011      Progress Note-Follow Up  Subjective  Chief Complaint  Chief Complaint  Patient presents with  . Annual Exam    physical    HPI  Patient is a 70 year old Caucasian female in today for routine followup. She noticed ongoing trouble with her short-term memory but lives at home with her husband and is generally doing well. Still able to engage in activities she enjoys. Is following closely with neurology and is stable on her current regimen. She is tolerating her Aricept on her Celexa while denies any depression. Denies any recent illness, fevers, chills, chest pain, palpitations, shortness or breath, GI or GU complaints. She's not had any troubles or heartburn. She is a good appetite. Denies any difficulty with sleeping. She is struggling with some mild right wrist and right shoulder pain. She got a she's just finished painting and a majority of the inside of her house with a head rush and does believe it is an overuse injury. No swelling or traumatic injury. She is having some ongoing left knee pain secondary to her arthritis. She has had her right knee replaced for the same reason and tolerated that well but she is hesitant to proceed with the left knee at this time. At present she is using just watchful waiting and is trying to stay as active as possible.  Past Medical History  Diagnosis Date  . Hyperlipidemia   . Depression   . Alcohol abuse   . Arthritis   . GERD (gastroesophageal reflux disease)   . Kidney stone   . Migraine   . Osteoporosis   . Dementia   . OSTEOPOROSIS 12/17/2007  . OSTEOARTHRITIS, KNEE 05/23/2010  . GERD 05/11/2007  . DEMENTIA 07/20/2008  . ANXIETY DEPRESSION 05/23/2010  . Alzheimer's disease 05/23/2010    Past Surgical History  Procedure Date  . Total knee arthroplasty     right  . Tonsillectomy     Family History  Problem Relation Age of Onset  . Anemia Mother   . Alzheimer's disease Mother   .  Emphysema Mother     smoker  . Obesity Sister   . Coronary artery disease Brother     smoker  . Heart attack Brother   . Obesity Daughter   . Depression Daughter   . Alcohol abuse Son   . Depression Son   . Emphysema Sister     smoker    History   Social History  . Marital Status: Married    Spouse Name: N/A    Number of Children: N/A  . Years of Education: N/A   Occupational History  . 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 Smoking status: Never Smoker   . Smokeless tobacco: Never Used  . Alcohol Use: No     quit- had a problem w/ alcohol and has abstained for years  . Drug Use: No  . Sexually Active: Not on file   Other Topics Concern  . Not on file   Social History Narrative   Uses seat belt regularly    Current Outpatient Prescriptions on File Prior to Visit  Medication Sig Dispense Refill  . alendronate (FOSAMAX) 70 MG tablet Take 70 mg by mouth every 7 (seven) days. Take with a full glass of water on an empty stomach.       . calcium carbonate (CALCIUM 600) 600 MG TABS Take 1,200  mg by mouth daily.        . Cholecalciferol (VITAMIN D3) 1000 UNITS CAPS Take 1 capsule by mouth daily.        . citalopram (CELEXA) 40 MG tablet Take 1 tablet (40 mg total) by mouth daily.  90 tablet  1  . donepezil (ARICEPT) 10 MG tablet Take 10 mg by mouth daily.        Marland Kitchen ibuprofen (ADVIL,MOTRIN) 200 MG tablet Take 200 mg by mouth every 6 (six) hours as needed.        . simvastatin (ZOCOR) 20 MG tablet TAKE 1 TABLET BY MOUTH EVERY NIGHT AT BEDTIME  90 tablet  1    No Known Allergies  Review of Systems  Review of Systems  Constitutional: Negative for fever and malaise/fatigue.  HENT: Negative for congestion.   Eyes: Negative for discharge.  Respiratory: Negative for shortness of breath.   Cardiovascular: Negative for chest pain, palpitations and leg swelling.  Gastrointestinal: Negative for heartburn, nausea, abdominal pain, diarrhea,  constipation, blood in stool and melena.  Genitourinary: Negative for dysuria.  Musculoskeletal: Negative for falls.  Skin: Negative for rash.  Neurological: Negative for loss of consciousness and headaches.  Endo/Heme/Allergies: Negative for polydipsia. Does not bruise/bleed easily.  Psychiatric/Behavioral: Positive for memory loss. Negative for depression and suicidal ideas. The patient is not nervous/anxious and does not have insomnia.     Objective  BP 126/76  Pulse 62  Temp(Src) 98.3 F (36.8 C) (Oral)  Ht 5' 2.5" (1.588 m)  Wt 123 lb 12.8 oz (56.155 kg)  BMI 22.28 kg/m2  SpO2 96%  Physical Exam  Physical Exam  Constitutional: She is oriented to person, place, and time and well-developed, well-nourished, and in no distress. No distress.  HENT:  Head: Normocephalic and atraumatic.  Right Ear: External ear normal.  Left Ear: External ear normal.  Nose: Nose normal.  Mouth/Throat: Oropharynx is clear and moist. No oropharyngeal exudate.       Upper dentures noted  Eyes: Conjunctivae are normal.  Neck: Neck supple. No thyromegaly present.  Cardiovascular: Normal rate, regular rhythm and normal heart sounds.   No murmur heard. Pulmonary/Chest: Effort normal and breath sounds normal. She has no wheezes.  Abdominal: She exhibits no distension and no mass.  Musculoskeletal: Normal range of motion. She exhibits no edema and no tenderness.  Lymphadenopathy:    She has no cervical adenopathy.  Neurological: She is alert and oriented to person, place, and time. She has normal reflexes. Gait normal. GCS score is 15.  Skin: Skin is warm and dry. No rash noted. She is not diaphoretic.  Psychiatric: Memory and affect normal.    Lab Results  Component Value Date   TSH 1.18 12/17/2007   Lab Results  Component Value Date   WBC 3.7* 12/17/2007   HGB 13.9 01/26/2010   HCT 41.0 01/26/2010   MCV 91.4 12/17/2007   PLT 256 12/17/2007   Lab Results  Component Value Date   CREATININE  0.8 01/26/2010   BUN 21 01/26/2010   NA 137 01/26/2010   K 4.0 01/26/2010   CL 103 01/26/2010   CO2 30 12/17/2007   Lab Results  Component Value Date   ALT 13 12/17/2007   AST 18 12/17/2007   ALKPHOS 61 12/17/2007   BILITOT 0.7 12/17/2007   Lab Results  Component Value Date   CHOL 216* 01/15/2008   Lab Results  Component Value Date   HDL 65.0 01/15/2008   No results found for  this basename: LDLCALC   Lab Results  Component Value Date   TRIG 55 01/15/2008   Lab Results  Component Value Date   CHOLHDL 3.3 CALC 01/15/2008     Assessment & Plan  ALZHEIMER'S DISEASE Patient is following with neurology and stable on Celexa and Aricept will not make any changes to her regimen at this time.  ANXIETY DEPRESSION She denies any depression and is animated and in good spirits here in the office today, no changes to her meds at present  Hyperlipidemia Tolerating her Zocor and is fasting fo we have drawn a lipid panel and are awaiting results before ordering any med changes  GERD No recent difficulties, not on any regular meds  OSTEOARTHRITIS, KNEE Has had the right knee replaced with good results and is considering having the left knee done as well secondary to it causing her a great deal of pain, will wait for now, encouraged to stay as active as possible  OSTEOPOROSIS Tolerating Fosamax and calcium, no changes  Wrist pain, right Appears to be an overuse injury and she is done painting now. Encouraged her to rest it and apply ice then Aspercreme twice daily, if no improvement she should call for further instructions  Routine general medical examination at a health care facility She declines a pap today but agrees to return for full pelvic exam and annual check up in 6 months time

## 2011-04-17 NOTE — Assessment & Plan Note (Signed)
She denies any depression and is animated and in good spirits here in the office today, no changes to her meds at present

## 2011-04-17 NOTE — Assessment & Plan Note (Signed)
No recent difficulties, not on any regular meds

## 2011-04-17 NOTE — Assessment & Plan Note (Signed)
She declines a pap today but agrees to return for full pelvic exam and annual check up in 6 months time

## 2011-04-17 NOTE — Assessment & Plan Note (Signed)
Tolerating Fosamax and calcium, no changes

## 2011-04-17 NOTE — Assessment & Plan Note (Signed)
Tolerating her Zocor and is fasting fo we have drawn a lipid panel and are awaiting results before ordering any med changes

## 2011-04-17 NOTE — Patient Instructions (Addendum)
Hypercholesterolemia High Blood Cholesterol Cholesterol is a white, waxy, fat-like protein needed by your body in small amounts. The liver makes all the cholesterol you need. It is carried from the liver by the blood through the blood vessels. Deposits (plaque) may build up on blood vessel walls. This makes the arteries narrower and stiffer. Plaque increases the risk for heart attack and stroke. You cannot feel your cholesterol level even if it is very high. The only way to know is by a blood test to check your lipid (fats) levels. Once you know your cholesterol levels, you should keep a record of the test results. Work with your caregiver to to keep your levels in the desired range. WHAT THE RESULTS MEAN:  Total cholesterol is a rough measure of all the cholesterol in your blood.   LDL is the so-called bad cholesterol. This is the type that deposits cholesterol in the walls of the arteries. You want this level to be low.   HDL is the good cholesterol because it cleans the arteries and carries the LDL away. You want this level to be high.   Triglycerides are fat that the body can either burn for energy or store. High levels are closely linked to heart disease.  DESIRED LEVELS:  Total cholesterol below 200.   LDL below 100 for people at risk, below 70 for very high risk.   HDL above 50 is good, above 60 is best.   Triglycerides below 150.  HOW TO LOWER YOUR CHOLESTEROL:  Diet.   Choose fish or white meat chicken and [place], roasted or baked. Limit fatty cuts of red meat, fried foods, and processed meats, such as sausage and lunch meat.   Eat lots of fresh fruits and vegetables. Choose whole grains, beans, pasta, potatoes and cereals.   Use only small amounts of olive, corn or canola oils. Avoid butter, mayonnaise, shortening or palm kernel oils. Avoid foods with trans-fats.   Use skim/nonfat milk and low-fat/nonfat yogurt and cheeses. Avoid whole milk, cream, ice cream, egg yolks and  cheeses. Healthy desserts include angel food cake, gingersnaps, animal crackers, hard candy, popsicles, and low-fat/nonfat frozen yogurt. Avoid pastries, cakes, pies and cookies.   Exercise.   A regular program helps decrease LDL and raises HDL.   Helps with weight control.   Do things that increase your activity level like gardening, walking, or taking the stairs.   Medication.   May be prescribed by your caregiver to help lowering cholesterol and the risk for heart disease.   You may need medicine even if your levels are normal if you have several risk factors.  HOME CARE INSTRUCTIONS  Follow your diet and exercise programs as suggested by your caregiver.   Take medications as directed.   Have blood work done when your caregiver feels it is necessary.  MAKE SURE YOU:   Understand these instructions.   Will watch your condition.   Will get help right away if you are not doing well or get worse.  Document Released: 08/05/2005 Document Re-Released: 07/18/2008 Exit[employer] Patient Information 2011 ExitCare, [place].   FOR WRIST PAIN  Apply ice and Aspercreme twice daily and call if pain persists

## 2011-05-24 ENCOUNTER — Ambulatory Visit (INDEPENDENT_AMBULATORY_CARE_PROVIDER_SITE_OTHER): Payer: Medicare Other

## 2011-05-24 ENCOUNTER — Inpatient Hospital Stay (INDEPENDENT_AMBULATORY_CARE_PROVIDER_SITE_OTHER)
Admission: RE | Admit: 2011-05-24 | Discharge: 2011-05-24 | Disposition: A | Discharge status: Home / Self Care | Payer: Medicare Other | Source: Ambulatory Visit | Attending: Family Medicine | Admitting: Family Medicine

## 2011-05-24 DIAGNOSIS — M25473 Effusion, unspecified ankle: Secondary | ICD-10-CM

## 2011-05-24 DIAGNOSIS — M25476 Effusion, unspecified foot: Secondary | ICD-10-CM

## 2011-05-24 LAB — DIFFERENTIAL
Basophils Absolute: 0.1 10*3/uL (ref 0.0–0.1)
Eosinophils Absolute: 0.3 10*3/uL (ref 0.0–0.7)
Eosinophils Relative: 4 % (ref 0–5)
Lymphocytes Relative: 41 % (ref 12–46)
Neutrophils Relative %: 46 % (ref 43–77)

## 2011-05-24 LAB — CBC
HCT: 22.5 — ABNORMAL LOW
HCT: 23.1 — ABNORMAL LOW
HCT: 39.7 % (ref 36.0–46.0)
Hemoglobin: 7.8 — CL
MCHC: 34.8
MCV: 92.7
Platelets: 179
Platelets: 215
Platelets: 234 10*3/uL (ref 150–400)
RDW: 12
RDW: 12.3
RDW: 11.8 % (ref 11.5–15.5)
WBC: 8.3
WBC: 6.5 10*3/uL (ref 4.0–10.5)

## 2011-05-24 LAB — PROTIME-INR
INR: 1.4
Prothrombin Time: 17.9 — ABNORMAL HIGH

## 2011-05-27 LAB — NO BLOOD PRODUCTS

## 2011-05-27 LAB — BASIC METABOLIC PANEL
BUN: 7
BUN: 8
CO2: 25
Calcium: 7.7 — ABNORMAL LOW
Calcium: 8 — ABNORMAL LOW
Creatinine, Ser: 0.54
Creatinine, Ser: 0.68
GFR calc Af Amer: >60
GFR calc non Af Amer: >60
Potassium: 4

## 2011-05-27 LAB — COMPREHENSIVE METABOLIC PANEL
AST: 21
Albumin: 3.4 — ABNORMAL LOW
Alkaline Phosphatase: 64
Chloride: 105
GFR calc Af Amer: >60
Potassium: 4.1
Sodium: 140
Total Bilirubin: 0.7

## 2011-05-27 LAB — URINALYSIS, ROUTINE W REFLEX MICROSCOPIC
Bilirubin Urine: NEGATIVE
Hgb urine dipstick: NEGATIVE
Nitrite: NEGATIVE
Specific Gravity, Urine: 1.023
pH: 6

## 2011-05-27 LAB — PROTIME-INR
INR: 1
INR: 1.2
Prothrombin Time: 13.8
Prothrombin Time: 15.3 — ABNORMAL HIGH

## 2011-05-27 LAB — URINE MICROSCOPIC-ADD ON

## 2011-05-27 LAB — CBC
MCHC: 34.9
Platelets: 194
Platelets: 197
Platelets: 274
RBC: 2.75 — ABNORMAL LOW
RDW: 12.1
WBC: 12.9 — ABNORMAL HIGH
WBC: 5.2

## 2011-05-27 LAB — APTT: aPTT: 39 — ABNORMAL HIGH

## 2011-06-12 ENCOUNTER — Ambulatory Visit (INDEPENDENT_AMBULATORY_CARE_PROVIDER_SITE_OTHER): Payer: Medicare Other

## 2011-06-12 DIAGNOSIS — Z23 Encounter for immunization: Secondary | ICD-10-CM

## 2011-07-02 ENCOUNTER — Telehealth: Payer: Self-pay | Admitting: Family Medicine

## 2011-07-02 NOTE — Telephone Encounter (Signed)
Pt called and requested physical and labs to be sent to her home. Patient advised I would put them in the mail and at her next appt to fill out a DPR form.

## 2011-07-02 NOTE — Telephone Encounter (Signed)
I informed patients spouse that unfortunately there isn't a DPR for [place] to release information to him and that the patient would have to call back and request the information.

## 2011-07-15 ENCOUNTER — Telehealth: Payer: Self-pay

## 2011-07-15 NOTE — Telephone Encounter (Signed)
FYI: pts husband called stating that he would like to know why patient didn't have an EKG at her annual appt because he always has one at his? I explained to patient that each doctor does things different and he stated he didn't know that was the physicians choice? Pt then stated that he would like to know why patient didn't have a breast exam and pap? I asked patients spouse if patient asked for this to be done and he said he didn't believe so and I told him this isn't something that is done at every annual appt. Pt then said okay goodbye and after I was relaying message to MD I was still looking in patients chart and saw that MD declined pap and said she would come back in 6 months for this. I called patients husband to let him know this.

## 2011-07-20 ENCOUNTER — Other Ambulatory Visit: Payer: Self-pay | Admitting: Family Medicine

## 2011-08-26 ENCOUNTER — Other Ambulatory Visit: Payer: Self-pay

## 2011-08-26 MED ORDER — ALENDRONATE SODIUM 70 MG PO TABS: 70.0000 mg | ORAL_TABLET | ORAL | Status: DC

## 2011-08-26 MED ORDER — CITALOPRAM HYDROBROMIDE 40 MG PO TABS: 40.0000 mg | ORAL_TABLET | Freq: Every day | ORAL | Status: DC

## 2011-08-26 MED ORDER — SIMVASTATIN 20 MG PO TABS: 20.0000 mg | ORAL_TABLET | Freq: Every day | ORAL | Status: DC

## 2011-09-04 ENCOUNTER — Other Ambulatory Visit: Payer: Self-pay

## 2011-09-04 MED ORDER — SIMVASTATIN 20 MG PO TABS: 20.0000 mg | ORAL_TABLET | Freq: Every day | ORAL | Status: DC

## 2011-09-04 MED ORDER — ALENDRONATE SODIUM 70 MG PO TABS: 70.0000 mg | ORAL_TABLET | ORAL | Status: DC

## 2011-09-04 MED ORDER — CITALOPRAM HYDROBROMIDE 40 MG PO TABS: 40.0000 mg | ORAL_TABLET | Freq: Every day | ORAL | Status: DC

## 2011-10-17 ENCOUNTER — Ambulatory Visit: Payer: Medicare Other | Admitting: Family Medicine

## 2011-12-02 ENCOUNTER — Ambulatory Visit (INDEPENDENT_AMBULATORY_CARE_PROVIDER_SITE_OTHER): Payer: Medicare Other | Admitting: Family Medicine

## 2011-12-02 DIAGNOSIS — K219 Gastro-esophageal reflux disease without esophagitis: Secondary | ICD-10-CM

## 2011-12-03 NOTE — Progress Notes (Signed)
Patient did not keep appt.

## 2011-12-04 ENCOUNTER — Ambulatory Visit: Payer: Medicare Other | Admitting: Family Medicine

## 2011-12-06 ENCOUNTER — Ambulatory Visit: Payer: Medicare Other | Admitting: Family Medicine

## 2012-01-21 ENCOUNTER — Other Ambulatory Visit: Payer: Self-pay | Admitting: *Deleted

## 2012-01-21 MED ORDER — SIMVASTATIN 20 MG PO TABS: 20.0000 mg | ORAL_TABLET | Freq: Every day | ORAL | Status: DC

## 2012-01-21 NOTE — Telephone Encounter (Signed)
Faxed refill request received from pharmacy for simvastatin Last filled by MD on 09/04/11, #90 x 1 Last seen on 04/16/12 Follow up needed in 2/13.  Pt did not keep appt. RX sent until pt can make appt.

## 2012-05-08 ENCOUNTER — Ambulatory Visit (INDEPENDENT_AMBULATORY_CARE_PROVIDER_SITE_OTHER): Payer: Medicare Other

## 2012-05-08 DIAGNOSIS — Z23 Encounter for immunization: Secondary | ICD-10-CM

## 2012-05-11 ENCOUNTER — Other Ambulatory Visit: Payer: Self-pay

## 2012-05-11 MED ORDER — CITALOPRAM HYDROBROMIDE 40 MG PO TABS: 40.0000 mg | ORAL_TABLET | Freq: Every day | ORAL | Status: DC

## 2012-05-28 ENCOUNTER — Encounter: Payer: Medicare Other | Admitting: Family Medicine

## 2012-06-09 ENCOUNTER — Ambulatory Visit (INDEPENDENT_AMBULATORY_CARE_PROVIDER_SITE_OTHER): Payer: Medicare Other | Admitting: Family Medicine

## 2012-06-09 ENCOUNTER — Encounter: Payer: Self-pay | Admitting: Family Medicine

## 2012-06-09 VITALS — BP 110/70 | Temp 98.1°F | Ht 63.75 in | Wt 124.0 lb

## 2012-06-09 DIAGNOSIS — G309 Alzheimer's disease, unspecified: Secondary | ICD-10-CM

## 2012-06-09 DIAGNOSIS — E785 Hyperlipidemia, unspecified: Secondary | ICD-10-CM

## 2012-06-09 DIAGNOSIS — F341 Dysthymic disorder: Secondary | ICD-10-CM

## 2012-06-09 DIAGNOSIS — F0280 Dementia in other diseases classified elsewhere without behavioral disturbance: Secondary | ICD-10-CM

## 2012-06-09 DIAGNOSIS — IMO0002 Reserved for concepts with insufficient information to code with codable children: Secondary | ICD-10-CM

## 2012-06-09 DIAGNOSIS — M810 Age-related osteoporosis without current pathological fracture: Secondary | ICD-10-CM

## 2012-06-09 DIAGNOSIS — Z0000 Encounter for general adult medical examination without abnormal findings: Secondary | ICD-10-CM

## 2012-06-09 DIAGNOSIS — M1710 Unilateral primary osteoarthritis, unspecified knee: Secondary | ICD-10-CM

## 2012-06-09 LAB — POCT URINALYSIS DIPSTICK
Glucose, UA: NEGATIVE
Ketones, UA: NEGATIVE
Spec Grav, UA: 1.015
Urobilinogen, UA: 0.2

## 2012-06-09 LAB — CBC WITH DIFFERENTIAL/PLATELET
Basophils Relative: 0.9 % (ref 0.0–3.0)
Eosinophils Absolute: 0.1 10*3/uL (ref 0.0–0.7)
Eosinophils Relative: 1.6 % (ref 0.0–5.0)
Lymphocytes Relative: 33.2 % (ref 12.0–46.0)
Neutrophils Relative %: 53.8 % (ref 43.0–77.0)
Platelets: 237 10*3/uL (ref 150.0–400.0)
RBC: 4.35 Mil/uL (ref 3.87–5.11)
WBC: 5.9 10*3/uL (ref 4.5–10.5)

## 2012-06-09 LAB — BASIC METABOLIC PANEL
BUN: 17 mg/dL (ref 6–23)
Chloride: 103 mEq/L (ref 96–112)
Creatinine, Ser: 0.7 mg/dL (ref 0.4–1.2)
Glucose, Bld: 77 mg/dL (ref 70–99)
Potassium: 5.2 mEq/L — ABNORMAL HIGH (ref 3.5–5.1)

## 2012-06-09 MED ORDER — CITALOPRAM HYDROBROMIDE 40 MG PO TABS: 40.0000 mg | ORAL_TABLET | Freq: Every day | ORAL | Status: DC

## 2012-06-09 NOTE — Progress Notes (Signed)
  Subjective:    Patient ID: Kim Hernandez, female    DOB: 1940/10/16, 71 y.o.   MRN: [id number]  HPI Kim Hernandez is a 71 year old married female nonsmoker who comes in today for a Medicare wellness examination as a new patient  She takes calcium vitamin D for osteoporosis the Fosamax has been discontinued  She takes 40 mg of Celexa each bedtime for mild depression, Aricept 10 mg daily along with a XON a from Dr. Dyanne Iha her neurologist. She also takes Zocor 20 mg a day for hyperlipidemia  She gets routine eye care, dental care, BSE monthly, recent colonoscopy, tetanus 2010, Pneumovax 2006, seasonal flu shot 2013 shingles vaccine she does not recall the date     Review of Systems  Constitutional: Negative.   HENT: Negative.   Eyes: Negative.   Respiratory: Negative.   Cardiovascular: Negative.   Gastrointestinal: Negative.   Genitourinary: Negative.   Musculoskeletal: Negative.   Neurological: Negative.   Hematological: Negative.   Psychiatric/Behavioral: Negative.        Objective:   Physical Exam  Constitutional: She is oriented to person, place, and time. She appears well-developed and well-nourished.  HENT:  Head: Normocephalic and atraumatic.  Right Ear: External ear normal.  Left Ear: External ear normal.  Nose: Nose normal.  Mouth/Throat: Oropharynx is clear and moist.  Eyes: EOM are normal. Pupils are equal, round, and reactive to light.  Neck: Normal range of motion. Neck supple. No thyromegaly present.  Cardiovascular: Normal rate, regular rhythm, normal heart sounds and intact distal pulses.  Exam reveals no gallop and no friction rub.   No murmur heard. Pulmonary/Chest: Effort normal and breath sounds normal.  Abdominal: Soft. Bowel sounds are normal. She exhibits no distension and no mass. There is no tenderness. There is no rebound.  Genitourinary:       Bilateral breast exam normal  Musculoskeletal: Normal range of motion.  Lymphadenopathy:    She has no  cervical adenopathy.  Neurological: She is alert and oriented to person, place, and time. She has normal reflexes. No cranial nerve deficit. She exhibits normal muscle tone. Coordination normal.       Short-term memory loss  Skin: Skin is warm and dry.  Psychiatric: She has a normal mood and affect. Her behavior is normal. Judgment and thought content normal.          Assessment & Plan:  Healthy female  History of mild depression continue Celexa  Alzheimer's disease continue Aricept and asked her on BX Dr. CD her neurologist  Hyperlipidemia I stop the Zocor is probably not beneficial at this point in her life and there have been some about Alzheimer's and hyperlipidemia medication  History of osteoporosis recommend daily exercise stop calcium and vitamin D

## 2012-06-09 NOTE — Patient Instructions (Signed)
Continue your current medications except stop the simvastatin  Return in one year sooner if any problems  30 minutes of walking daily

## 2012-06-17 ENCOUNTER — Encounter: Payer: Self-pay | Admitting: *Deleted

## 2012-06-19 ENCOUNTER — Telehealth: Payer: Self-pay | Admitting: Family Medicine

## 2012-06-19 NOTE — Telephone Encounter (Signed)
Pts spouse called re: lab results from cpx. Pt did not rcv the results from EKG and also her labs re: cholesterol.

## 2012-06-23 NOTE — Telephone Encounter (Signed)
Left message on machine that copy of labs were mailed to home address

## 2012-06-29 ENCOUNTER — Telehealth: Payer: Self-pay | Admitting: Family Medicine

## 2012-06-29 NOTE — Telephone Encounter (Signed)
Pt called req to know the date of last shingles vax? Believes it was in 2007.

## 2012-06-29 NOTE — Telephone Encounter (Signed)
Spoke with husband

## 2012-09-06 ENCOUNTER — Encounter: Payer: Self-pay | Admitting: Family Medicine

## 2012-09-23 ENCOUNTER — Other Ambulatory Visit: Payer: Self-pay

## 2012-09-23 ENCOUNTER — Other Ambulatory Visit: Payer: Self-pay | Admitting: Family Medicine

## 2012-09-23 MED ORDER — SIMVASTATIN 20 MG PO TABS: 20.0000 mg | ORAL_TABLET | Freq: Every evening | ORAL | Status: DC

## 2012-09-23 NOTE — Telephone Encounter (Signed)
prescription sent

## 2012-09-23 NOTE — Telephone Encounter (Signed)
Opened in error

## 2012-12-17 ENCOUNTER — Telehealth: Payer: Self-pay

## 2012-12-17 MED ORDER — AXONA PO PACK: 40.0000 g | PACK | Freq: Every day | ORAL | Status: DC

## 2012-12-17 NOTE — Telephone Encounter (Signed)
Patient called requesting refills

## 2012-12-24 ENCOUNTER — Telehealth: Payer: Self-pay

## 2012-12-24 NOTE — Telephone Encounter (Signed)
Spouse called clinic and left a message saying his wife refused the [place] Rx when it was delivered.  He states she says she no longer wants to take this medication.  (Patient called [place] on 05/01 and requested a refill on this medication)  Spouse indicates we have to call pharmacy, [facility], and they say they will give the patient a refund for the medication.  I called them at [phone].  Spoke with Corrie Dandy.  She said they have cancelled the order and the patient should receive a refund on their card in 7-10 business days.  I asked her for a confirmation number, and she said they do not have one, the patient just needs to look at their card statement.  I called the patient.  Spouse is aware to watch for refund.

## 2013-02-05 ENCOUNTER — Encounter: Payer: Self-pay | Admitting: Family Medicine

## 2013-02-10 NOTE — Telephone Encounter (Signed)
Fleet Contras can you update this info for patient

## 2013-02-10 NOTE — Telephone Encounter (Signed)
Kim Hernandez can you update this info for patient

## 2013-03-03 ENCOUNTER — Encounter: Payer: Self-pay | Admitting: Neurology

## 2013-03-03 ENCOUNTER — Ambulatory Visit (INDEPENDENT_AMBULATORY_CARE_PROVIDER_SITE_OTHER): Payer: Medicare Other | Admitting: Neurology

## 2013-03-03 VITALS — BP 109/57 | HR 56 | Resp 16 | Ht 62.5 in | Wt 122.0 lb

## 2013-03-03 DIAGNOSIS — F0280 Dementia in other diseases classified elsewhere without behavioral disturbance: Secondary | ICD-10-CM

## 2013-03-03 MED ORDER — DONEPEZIL HCL 10 MG PO TABS: 10.0000 mg | ORAL_TABLET | Freq: Every day | ORAL | Status: DC

## 2013-03-03 NOTE — Patient Instructions (Signed)
Driving and Equipment Restrictions °Some medical problems make it dangerous to drive, ride a bike, or use machines. Some of these problems are: °· A hard blow to the head (concussion). °· Passing out (fainting). °· Twitching and shaking (seizures). °· Low blood sugar. °· Taking medicine to help you relax (sedatives). °· Taking pain medicines. °· Wearing an eye patch. °· Wearing splints. This can make it hard to use parts of your body that you need to drive safely. °HOME CARE  °· Do not drive until your doctor says it is okay. °· Do not use machines until your doctor says it is okay. °You may need a form signed by your doctor (medical release) before you can drive again. You may also need this form before you do other tasks where you need to be fully alert. °MAKE SURE YOU: °· Understand these instructions. °· Will watch your condition. °· Will get help right away if you are not doing well or get worse. °Document Released: 09/12/2004 Document Revised: 10/28/2011 Document Reviewed: 12/13/2009 °ExitCare® Patient Information ©2014 [employer]. ° °
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
## 2013-04-16 ENCOUNTER — Encounter: Payer: Self-pay | Admitting: Neurology

## 2013-05-05 ENCOUNTER — Other Ambulatory Visit: Payer: Self-pay

## 2013-05-05 DIAGNOSIS — F0280 Dementia in other diseases classified elsewhere without behavioral disturbance: Secondary | ICD-10-CM

## 2013-05-05 MED ORDER — DONEPEZIL HCL 10 MG PO TABS: 10.0000 mg | ORAL_TABLET | Freq: Every day | ORAL | Status: DC

## 2013-05-05 NOTE — Telephone Encounter (Signed)
Pharmacy Requests 90 day Rx

## 2013-05-24 ENCOUNTER — Encounter: Payer: Self-pay | Admitting: Family Medicine

## 2013-05-24 ENCOUNTER — Ambulatory Visit (INDEPENDENT_AMBULATORY_CARE_PROVIDER_SITE_OTHER): Payer: Medicare Other | Admitting: Family Medicine

## 2013-05-24 VITALS — BP 110/70 | Temp 98.2°F | Wt 126.0 lb

## 2013-05-24 DIAGNOSIS — M25559 Pain in unspecified hip: Secondary | ICD-10-CM

## 2013-05-24 DIAGNOSIS — Z23 Encounter for immunization: Secondary | ICD-10-CM

## 2013-05-24 DIAGNOSIS — M25552 Pain in left hip: Secondary | ICD-10-CM | POA: Insufficient documentation

## 2013-05-24 NOTE — Progress Notes (Signed)
  Subjective:    Patient ID: Kim Hernandez, female    DOB: [birth year], 72 y.o.   MRN: [id number]  HPI Kim Hernandez is a 72 year old married female nonsmoker who comes in today for evaluation of 5 problems and a 15 minute visit  I asked her to pick which is a 5 problems is bothering her the most  She states the pain in her left hip  She's had left ear pain for 3 weeks. No history of trauma. She went on vacation he was walking more than normal. She says the pain comes and goes it lasted about 5 minutes 5 or 6 times per day. No previous history of trauma. No night pain no fever chills etc.   Review of Systems    review of systems otherwise negative Objective:   Physical Exam  Well-developed well-nourished female no acute distress  Examination the legs shows the skin to be normal the pulses are normal ankle knee hip normal there's palpable tenderness in the left SI joint.      Assessment & Plan:  Left ear pain and fleets information of the left SI joint plan Motrin 600 twice a day  Multiple other problems advised to return for followup  Depression and anxiety referred to Judithe Modest

## 2013-05-24 NOTE — Patient Instructions (Signed)
Motrin 600 mg twice daily with food  Continue your walking program  Make an appointment to see Judithe Modest for evaluation of the depression and anxiety symptoms  Return next week for a 30 minute appointment for evaluation of a cough and obtaining problems

## 2013-06-01 ENCOUNTER — Ambulatory Visit (INDEPENDENT_AMBULATORY_CARE_PROVIDER_SITE_OTHER): Payer: No Typology Code available for payment source | Admitting: Licensed Clinical Social Worker

## 2013-06-01 DIAGNOSIS — F4323 Adjustment disorder with mixed anxiety and depressed mood: Secondary | ICD-10-CM

## 2013-06-02 ENCOUNTER — Ambulatory Visit (INDEPENDENT_AMBULATORY_CARE_PROVIDER_SITE_OTHER)
Admission: RE | Admit: 2013-06-02 | Discharge: 2013-06-02 | Disposition: A | Discharge status: Home / Self Care | Payer: Medicare Other | Source: Ambulatory Visit | Attending: Family Medicine | Admitting: Family Medicine

## 2013-06-02 ENCOUNTER — Encounter: Payer: Self-pay | Admitting: Family Medicine

## 2013-06-02 ENCOUNTER — Ambulatory Visit (INDEPENDENT_AMBULATORY_CARE_PROVIDER_SITE_OTHER): Payer: Medicare Other | Admitting: Family Medicine

## 2013-06-02 VITALS — BP 120/70 | Temp 98.1°F | Wt 126.0 lb

## 2013-06-02 DIAGNOSIS — R059 Cough, unspecified: Secondary | ICD-10-CM

## 2013-06-02 DIAGNOSIS — R05 Cough: Secondary | ICD-10-CM

## 2013-06-02 DIAGNOSIS — M542 Cervicalgia: Secondary | ICD-10-CM | POA: Insufficient documentation

## 2013-06-02 DIAGNOSIS — R55 Syncope and collapse: Secondary | ICD-10-CM

## 2013-06-02 DIAGNOSIS — M25559 Pain in unspecified hip: Secondary | ICD-10-CM

## 2013-06-02 DIAGNOSIS — M25552 Pain in left hip: Secondary | ICD-10-CM

## 2013-06-02 NOTE — Progress Notes (Signed)
  Subjective:    Patient ID: Kim Hernandez, female    DOB: 13-Aug-1941, 72 y.o.   MRN: [id number]  HPI Kim Hernandez is a 72 year old married female nonsmoker who comes in today for evaluation of for problems  She states that about 2 months ago she began soreness in her left hip. She points the posterior portion of her hip as a source of her pain. She states the pain comes and goes. It's not precipitated by walking. She says is precipitated by bending. It does not keep her awake at night again no history of trauma.  She states that about 2 weeks ago she had a syncopal episode. She states she was sitting eating her breakfast the next thing she knew she was on the floor. She had no prodrome. Her husband got her up and she finished her breakfast. She then noticed a bruise over her left eye and forehead.  She's never had a history of syncope in the past.  She also states she's had a cough for about 8 months. She does not relate any change in her pulmonary status. She has no shortness of breath sputum production fever weight loss etc. She has no history of any allergic rhinitis. She states the cough does not bother her at night. And indeed it's not gotten worse in the past couple months.  She takes Celexa 20 mg mild depression Aricept 10 mg for mild dementia Boniva for bone health and Zocor for hyperlipidemia   Review of Systems Cardiac pulmonary GI with a peak and neurologic review of systems all negative except she's had a right knee replaced    Objective:   Physical Exam Well-developed well-nourished thin female no acute distress HEENT negative neck was supple no adenopathy lungs are clear cardiac exam normal abdominal exam normal  Examination of both hips was normal the scar in her right knee from previous total knee replacement. Otherwise full range and no motion of her both hips and no pain. She said she had a twinge of pain when she went from a sitting to the supine position.  No carotid  bruits       Assessment & Plan:  Left hip pain,,,,,,, probable some mild osteoarthritis  Clicking in the neck again some osteoarthritis  Cough times a month begin workup chest x-ray probably not related to allergy question question reflux she denies  Syncopal episode 2 weeks ago begin workup..... labs and neurologic consult

## 2013-06-02 NOTE — Patient Instructions (Signed)
Chest x-ray today  Call you neurologist Dr. CD for further evaluation of the syncopal episode  For the soreness in your hip and neck I would recommend Motrin 400 mg twice daily with food

## 2013-06-04 ENCOUNTER — Telehealth: Payer: Self-pay | Admitting: Family Medicine

## 2013-06-04 NOTE — Telephone Encounter (Signed)
Pt is at home now and can take your call

## 2013-06-04 NOTE — Telephone Encounter (Signed)
Pt notified of results see lab result note.

## 2013-06-05 ENCOUNTER — Encounter: Payer: Self-pay | Admitting: Family Medicine

## 2013-06-14 ENCOUNTER — Encounter: Payer: Self-pay | Admitting: Neurology

## 2013-06-19 ENCOUNTER — Encounter: Payer: Self-pay | Admitting: Family Medicine

## 2013-06-23 ENCOUNTER — Ambulatory Visit (INDEPENDENT_AMBULATORY_CARE_PROVIDER_SITE_OTHER): Payer: No Typology Code available for payment source | Admitting: Licensed Clinical Social Worker

## 2013-06-23 DIAGNOSIS — F4323 Adjustment disorder with mixed anxiety and depressed mood: Secondary | ICD-10-CM

## 2013-07-06 ENCOUNTER — Ambulatory Visit (INDEPENDENT_AMBULATORY_CARE_PROVIDER_SITE_OTHER): Payer: No Typology Code available for payment source | Admitting: Licensed Clinical Social Worker

## 2013-07-06 DIAGNOSIS — F4323 Adjustment disorder with mixed anxiety and depressed mood: Secondary | ICD-10-CM

## 2013-07-21 ENCOUNTER — Ambulatory Visit (INDEPENDENT_AMBULATORY_CARE_PROVIDER_SITE_OTHER): Payer: No Typology Code available for payment source | Admitting: Licensed Clinical Social Worker

## 2013-07-21 DIAGNOSIS — F4323 Adjustment disorder with mixed anxiety and depressed mood: Secondary | ICD-10-CM

## 2013-07-27 ENCOUNTER — Other Ambulatory Visit (INDEPENDENT_AMBULATORY_CARE_PROVIDER_SITE_OTHER): Payer: Medicare Other

## 2013-07-27 DIAGNOSIS — Z0000 Encounter for general adult medical examination without abnormal findings: Secondary | ICD-10-CM

## 2013-07-27 DIAGNOSIS — E785 Hyperlipidemia, unspecified: Secondary | ICD-10-CM

## 2013-07-27 LAB — POCT URINALYSIS DIPSTICK
Blood, UA: NEGATIVE
Glucose, UA: NEGATIVE
Nitrite, UA: NEGATIVE
Protein, UA: NEGATIVE
Spec Grav, UA: >=1.03
Urobilinogen, UA: 0.2

## 2013-07-27 LAB — LIPID PANEL
Total CHOL/HDL Ratio: 3
Triglycerides: 74 mg/dL (ref 0.0–149.0)

## 2013-07-27 LAB — CBC WITH DIFFERENTIAL/PLATELET
Basophils Relative: 1.2 % (ref 0.0–3.0)
Eosinophils Relative: 3.5 % (ref 0.0–5.0)
Lymphocytes Relative: 31.9 % (ref 12.0–46.0)
Monocytes Relative: 10.8 % (ref 3.0–12.0)
Neutrophils Relative %: 52.6 % (ref 43.0–77.0)
RBC: 4.19 Mil/uL (ref 3.87–5.11)
WBC: 5.1 10*3/uL (ref 4.5–10.5)

## 2013-07-27 LAB — BASIC METABOLIC PANEL
BUN: 25 mg/dL — ABNORMAL HIGH (ref 6–23)
Calcium: 9.3 mg/dL (ref 8.4–10.5)
Chloride: 102 mEq/L (ref 96–112)
Creatinine, Ser: 0.8 mg/dL (ref 0.4–1.2)
GFR: 74.68 mL/min (ref >60.00)

## 2013-07-27 LAB — HEPATIC FUNCTION PANEL
ALT: 16 U/L (ref 0–35)
AST: 20 U/L (ref 0–37)
Alkaline Phosphatase: 75 U/L (ref 39–117)
Bilirubin, Direct: 0.1 mg/dL (ref 0.0–0.3)
Total Protein: 6.9 g/dL (ref 6.0–8.3)

## 2013-07-27 LAB — LDL CHOLESTEROL, DIRECT: Direct LDL: 143.3 mg/dL

## 2013-07-29 ENCOUNTER — Other Ambulatory Visit: Payer: Self-pay | Admitting: Neurology

## 2013-07-29 ENCOUNTER — Other Ambulatory Visit: Payer: Self-pay | Admitting: Family Medicine

## 2013-08-03 ENCOUNTER — Ambulatory Visit (INDEPENDENT_AMBULATORY_CARE_PROVIDER_SITE_OTHER): Payer: Medicare Other | Admitting: Family Medicine

## 2013-08-03 ENCOUNTER — Encounter: Payer: Self-pay | Admitting: Family Medicine

## 2013-08-03 VITALS — BP 110/80 | Temp 98.2°F | Ht 62.0 in | Wt 125.0 lb

## 2013-08-03 DIAGNOSIS — F341 Dysthymic disorder: Secondary | ICD-10-CM

## 2013-08-03 DIAGNOSIS — Z0000 Encounter for general adult medical examination without abnormal findings: Secondary | ICD-10-CM

## 2013-08-03 DIAGNOSIS — F0280 Dementia in other diseases classified elsewhere without behavioral disturbance: Secondary | ICD-10-CM

## 2013-08-03 MED ORDER — CITALOPRAM HYDROBROMIDE 20 MG PO TABS: 20.0000 mg | ORAL_TABLET | Freq: Every day | ORAL | Status: DC

## 2013-08-03 MED ORDER — DONEPEZIL HCL 10 MG PO TABS: ORAL_TABLET | ORAL | Status: DC

## 2013-08-03 NOTE — Progress Notes (Signed)
Pre visit review using our clinic review tool, if applicable. No additional management support is needed unless otherwise documented below in the visit note. 

## 2013-08-03 NOTE — Patient Instructions (Signed)
Stop the simvastatin  Continue one aspirin tablet daily Celexa and Aricept  Walk 30 minutes daily@  Return in one year sooner if any problem

## 2013-08-03 NOTE — Progress Notes (Signed)
   Subjective:    Patient ID: Kim Hernandez, female    DOB: December 09, 1940, 72 y.o.   MRN: [id number]  HPI Kim Hernandez is a 72 year old married female nonsmoker who comes in today accompanied by her husband for a Medicare wellness examination  She takes Aricept 10 mg daily but because of a history of Alzheimer's dementia  She takes Celexa 20 mg a day bedtime an aspirin  She also has a lot of postnasal drip  She's been on Zocor for hyperlipidemia and stop that with her dementia  She gets routine eye care, dental care, recent mammogram 2 weeks ago normal vaccinations up-to-date  Cognitive function diminishing she has short-term memory loss consistent with Alzheimer's disease, home health safety reviewed no issues identified, no guns in the house, she does have a health care power of attorney and living well. She does not exercise on a regular basis   Review of Systems  Constitutional: Negative.   HENT: Negative.   Eyes: Negative.   Respiratory: Negative.   Cardiovascular: Negative.   Gastrointestinal: Negative.   Endocrine: Negative.   Genitourinary: Negative.   Musculoskeletal: Negative.   Allergic/Immunologic: Negative.   Neurological: Negative.   Hematological: Negative.   Psychiatric/Behavioral: Negative.        Objective:   Physical Exam  Nursing note and vitals reviewed. Constitutional: She appears well-developed and well-nourished.  HENT:  Head: Normocephalic and atraumatic.  Right Ear: External ear normal.  Left Ear: External ear normal.  Nose: Nose normal.  Mouth/Throat: Oropharynx is clear and moist.  Eyes: EOM are normal. Pupils are equal, round, and reactive to light.  Neck: Normal range of motion. Neck supple. No thyromegaly present.  Cardiovascular: Normal rate, regular rhythm, normal heart sounds and intact distal pulses.  Exam reveals no gallop and no friction rub.   No murmur heard. Pulmonary/Chest: Effort normal and breath sounds normal.  Abdominal: Soft.  Bowel sounds are normal. She exhibits no distension and no mass. There is no tenderness. There is no rebound.  Genitourinary:  Bilateral breast exam normal  Musculoskeletal: Normal range of motion.  Lymphadenopathy:    She has no cervical adenopathy.  Neurological: She is alert. She has normal reflexes. No cranial nerve deficit. She exhibits normal muscle tone. Coordination normal.  Skin: Skin is warm and dry.  Psychiatric: She has a normal mood and affect. Her behavior is normal. Judgment and thought content normal.          Assessment & Plan:  Alzheimer's dementia continue Aricept  History of depression continue Celexa  Stop Zocor

## 2013-08-04 ENCOUNTER — Ambulatory Visit (INDEPENDENT_AMBULATORY_CARE_PROVIDER_SITE_OTHER): Payer: No Typology Code available for payment source | Admitting: Licensed Clinical Social Worker

## 2013-08-04 DIAGNOSIS — F4323 Adjustment disorder with mixed anxiety and depressed mood: Secondary | ICD-10-CM

## 2013-09-01 ENCOUNTER — Ambulatory Visit: Payer: Medicare Other | Admitting: Neurology

## 2013-10-22 ENCOUNTER — Telehealth: Payer: Self-pay | Admitting: Family Medicine

## 2013-10-22 NOTE — Telephone Encounter (Signed)
Patient Information:  Caller Name: [name]  Phone: [phone]  Patient: Kim Hernandez, Kim Hernandez  Gender: Female  DOB: 10/27/1940  Age: 73 Years  PCP: Kelle Dartingodd, Jeffrey [facility](Family Practice)  Office Follow Up:  Does the office need to follow up with this patient?: No  Instructions For The Office: N/A  RN Note:  She is going to have it checked at [facility] on [address]  Symptoms  Reason For Call & Symptoms: Calling about inner R knee swollen and hurting onset 10/21/13. She has been walking one mile a day since 10/18/13  and stopped  on 10/21/13  when she woke up and it looked swollen. Hx of knee replacement in that knee.  It started hurting by afternoon on 10/21/13. Took 2 Advil yesterday and it didn't really help. Today @ 1615 put ice on and helping. Lower inner knee is tender and there is a lump that hurts when she pushes on it. Advised to have checked today for having a very swollen joint.  Reviewed Health History In EMR: Yes  Reviewed Medications In EMR: Yes  Reviewed Allergies In EMR: Yes  Reviewed Surgeries / Procedures: Yes  Date of Onset of Symptoms: 10/21/2013  Treatments Tried: Ice and Advil  Treatments Tried Worked: No  Guideline(s) Used:  Knee Pain  Disposition Per Guideline:   See Today in Office  Reason For Disposition Reached:   Very swollen joint  Advice Given:  Rest Your Knee   for the next couple days. Avoid activities that worsen your pain. Reduce activities that put a lot of strain on the knee joint (e.g., deep knee bends, stair climbing, running).  Expected Course:   If your knee pain does not get better during the next week or if it recurs, then you should make an appointment with your doctor.  Call Back If:  Knee pain lasts longer than 7 days  You become worse.  Patient Will Follow Care Advice:  YES

## 2013-11-25 ENCOUNTER — Ambulatory Visit (INDEPENDENT_AMBULATORY_CARE_PROVIDER_SITE_OTHER): Payer: Medicare Other | Admitting: *Deleted

## 2013-11-25 DIAGNOSIS — Z23 Encounter for immunization: Secondary | ICD-10-CM

## 2014-01-03 ENCOUNTER — Ambulatory Visit (INDEPENDENT_AMBULATORY_CARE_PROVIDER_SITE_OTHER): Payer: Medicare Other | Admitting: Family Medicine

## 2014-01-03 ENCOUNTER — Encounter: Payer: Self-pay | Admitting: Family Medicine

## 2014-01-03 VITALS — BP 110/70 | Temp 98.4°F | Wt 127.0 lb

## 2014-01-03 DIAGNOSIS — R059 Cough, unspecified: Secondary | ICD-10-CM

## 2014-01-03 DIAGNOSIS — R05 Cough: Secondary | ICD-10-CM

## 2014-01-03 DIAGNOSIS — K219 Gastro-esophageal reflux disease without esophagitis: Secondary | ICD-10-CM

## 2014-01-03 NOTE — Patient Instructions (Signed)
Zyrtec 10 mg and Prilosec 20 mg,,,,,,,, one of each with evening meal

## 2014-01-03 NOTE — Progress Notes (Signed)
   Subjective:    Patient ID: Kim Hernandez, female    DOB: 12/20/1940, 73 y.o.   MRN: [id number]  HPI Kim Hernandez is a 73 year old female who comes in today accompanied by her husband for evaluation of a cough  She states she has a cough at night which does not bother her and she doesn't wake up. She's not cough during the day. It only bothers her husband. She has no fever chills weight loss nausea vomiting diarrhea shortness of breath or difficulty swallowing. She does have underlying allergic rhinitis.  He tried a 12 day course of Prilosec didn't work   Review of Systems    review of systems otherwise negative Objective:   Physical Exam Well-developed well-nourished female no acute distress vital signs stable she is afebrile HEENT were negative except she has a fair amount of nasal edema bilaterally consistent with allergic rhinitis  Neck was supple no adenopathy thyroid was not enlarged I can palpate no masses. Cardiopulmonary exam normal       Assessment & Plan:  Nocturnal cough maybe allergy may be reflux treat symptomatically

## 2014-01-26 ENCOUNTER — Telehealth: Payer: Self-pay | Admitting: Neurology

## 2014-01-26 NOTE — Telephone Encounter (Signed)
Spouse calling and questioning the difference in comparison between donepezil (ARICEPT) 10 MG tablet and Nomenda.   Saw commercial and wanted to make sure patient is on best medication.  Please call and advise.

## 2014-01-27 MED ORDER — MEMANTINE HCL ER 28 MG PO CP24: 28.0000 mg | ORAL_CAPSULE | Freq: Two times a day (BID) | ORAL | Status: DC

## 2014-01-27 NOTE — Addendum Note (Signed)
Addended by: Melvyn Novas on: 01/27/2014 04:17 PM   Modules accepted: Orders

## 2014-01-27 NOTE — Telephone Encounter (Signed)
Pt's last OV was 03/03/2013. Please advise, thanks.

## 2014-01-27 NOTE — Telephone Encounter (Signed)
Called pt and spoke with pt's husband Aneta Mins and husband stated that he would like Dr. Vickey Huger to prescribe the pt Namenda. Pt is currently taking Aricept and husband states that the pt's symptoms are getting worse and wanted to know if Dr. Vickey Huger could prescribe this medication or do the pt have to come in for an OV first. Please advise

## 2014-01-28 NOTE — Telephone Encounter (Signed)
Called pt and spoke with pt's husband Aneta Minshillip informing him that Dr. Vickey Hugerohmeier prescribed the pt namenda xr 28 mg and it was faxed over to pt's pharmacy. I advised the husband that if the pt has any other problems, questions or concerns to call the office. Husband verbalized understanding.

## 2014-02-08 ENCOUNTER — Telehealth: Payer: Self-pay | Admitting: Neurology

## 2014-02-08 MED ORDER — MEMANTINE HCL ER 28 MG PO CP24: 28.0000 mg | ORAL_CAPSULE | Freq: Every day | ORAL | Status: DC

## 2014-02-08 NOTE — Telephone Encounter (Signed)
The old formulation of Namenda was BID dosing, the new XR form is once daily dosing.  I called the patient back.  Spoke with Mr Bascom LevelsReeder.  Patient has not been taking two daily.  He would prefer a 90 day Rx rather than 30 day.  Rx has been resent for 90 day supply.  I called [facility] and spoke with the pharmacist.  She verified they did get the 90 day Rx and will call the patient when it's ready for pick up.

## 2014-02-08 NOTE — Telephone Encounter (Signed)
Patient's husband calling about patient's Namenda script, says that patient was told to take it 2 times a day but was only given 30 capsules. Patient's husband asking if Dr. Vickey Hugerohmeier meant to put 30 days instead of 30 capsules. Please return call to patient and advise.

## 2014-02-14 ENCOUNTER — Telehealth: Payer: Self-pay | Admitting: Neurology

## 2014-02-14 DIAGNOSIS — F0390 Unspecified dementia without behavioral disturbance: Secondary | ICD-10-CM

## 2014-02-14 MED ORDER — MEMANTINE HCL ER 21 MG PO CP24: 21.0000 mg | ORAL_CAPSULE | ORAL | Status: DC

## 2014-02-14 NOTE — Telephone Encounter (Signed)
Patient's husband calling to state that the new Namenda medication that patient was prescribed has caused her to have intense dizziness, wants to know what to do about that. Please return call and advise.

## 2014-02-14 NOTE — Telephone Encounter (Signed)
Informed patient and husband and they verbalized understanding,will check pharmacy tomorrow for the 21mg  Namenda

## 2014-02-14 NOTE — Telephone Encounter (Signed)
1)Please return to 21 mg Namenda if still any of those in the house.  i will refill for that size  2) if not, take Namenda 28 mg at night with applesauce and and some crackers or chips.  Change tomorrow to the 21 mg dose you tolerated well.  3) make sure you drink fluids throughout the day.  CD

## 2014-02-14 NOTE — Telephone Encounter (Signed)
Spoke with patient and husband and they said that she started on Namenda-28mg  on Friday night and the dizziness started on that Saturday, she has one pill left and pharmacist suggested that she not stop before consulting with physician.

## 2014-04-26 ENCOUNTER — Ambulatory Visit: Payer: Medicare Other | Admitting: Family Medicine

## 2014-05-11 NOTE — Telephone Encounter (Signed)
Please refer to clinic  Notes.

## 2014-05-13 ENCOUNTER — Ambulatory Visit (INDEPENDENT_AMBULATORY_CARE_PROVIDER_SITE_OTHER): Payer: Medicare Other | Admitting: Gastroenterology

## 2014-05-13 ENCOUNTER — Encounter: Payer: Self-pay | Admitting: Gastroenterology

## 2014-05-13 VITALS — BP 90/66 | HR 68 | Ht 62.25 in | Wt 126.0 lb

## 2014-05-13 DIAGNOSIS — F068 Other specified mental disorders due to known physiological condition: Secondary | ICD-10-CM

## 2014-05-13 DIAGNOSIS — K5900 Constipation, unspecified: Secondary | ICD-10-CM | POA: Insufficient documentation

## 2014-05-13 DIAGNOSIS — R198 Other specified symptoms and signs involving the digestive system and abdomen: Secondary | ICD-10-CM

## 2014-05-13 DIAGNOSIS — R194 Change in bowel habit: Secondary | ICD-10-CM

## 2014-05-13 NOTE — Progress Notes (Signed)
_                                                                                                                History of Present Illness:  Kim Hernandez is a 73 year old white female referred for evaluation of change of bowel habits.  For the past several months she's noted a change characterized by constipation and passage of small caliber stools.  She is without abdominal pain or bleeding.  There has been no change in medications or diet.  She's had some bloating along with constipation.  Colonoscopy in 2011 was entirely normal.   Past Medical History  Diagnosis Date  . Hyperlipidemia   . Depression     monopolar  . Alcohol abuse   . Arthritis   . Kidney stone   . Migraine     pt denies  . OSTEOPOROSIS 12/17/2007  . OSTEOARTHRITIS, KNEE 05/23/2010  . GERD 05/11/2007    ?  Marland Kitchen DEMENTIA 07/20/2008  . ANXIETY DEPRESSION 05/23/2010  . Alzheimer's disease 05/23/2010    ?  Marland Kitchen Wrist pain, right 04/17/2011  . Memory loss   . Osteopenia    Past Surgical History  Procedure Laterality Date  . Total knee arthroplasty Right   . Tonsillectomy    . Dilation and curettage of uterus      forty years ago   family history includes Alcohol abuse in her son; Alzheimer's disease in her mother; Anemia in her mother; Coronary artery disease in her brother; Depression in her daughter and son; Emphysema in her mother and sister; Heart attack in her brother; Obesity in her daughter and sister; Suicidality in her son. Current Outpatient Prescriptions  Medication Sig Dispense Refill  . aspirin 325 MG tablet Take 162.5 mg by mouth daily.      . Cholecalciferol (VITAMIN D3) 1000 UNITS CAPS Take 1 capsule by mouth daily.        . citalopram (CELEXA) 20 MG tablet Take 1 tablet (20 mg total) by mouth daily.  100 tablet  3  . donepezil (ARICEPT) 10 MG tablet TAKE 1 TABLET BY MOUTH ONCE DAILY  100 tablet  3  . Memantine HCl ER (NAMENDA XR) 28 MG CP24 Take 1 capsule by mouth daily.      . Omega-3  Fatty Acids (FISH OIL) 1200 MG CAPS Take 1 capsule by mouth daily.       No current facility-administered medications for this visit.   Allergies as of 05/13/2014 - Review Complete 05/13/2014  Allergen Reaction Noted  . Poison ivy extract [extract of poison ivy]  03/03/2013    reports that she has never smoked. She has never used smokeless tobacco. She reports that she does not drink alcohol or use illicit drugs.   Review of Systems: Pertinent positive and negative review of systems were noted in the above HPI section. All other review of systems were otherwise negative.  Vital signs were reviewed in today's medical record Physical Exam: General: Well developed , well nourished, no acute distress  Skin: anicteric Head: Normocephalic and atraumatic Eyes:  sclerae anicteric, EOMI Ears: Normal auditory acuity Mouth: No deformity or lesions Neck: Supple, no masses or thyromegaly Lungs: Clear throughout to auscultation Heart: Regular rate and rhythm; no murmurs, rubs or bruits Abdomen: Soft, non tender and non distended. No masses, hepatosplenomegaly or hernias noted. Normal Bowel sounds Rectal:deferred Musculoskeletal: Symmetrical with no gross deformities  Skin: No lesions on visible extremities Pulses:  Normal pulses noted Extremities: No clubbing, cyanosis, edema or deformities noted Neurological: Alert oriented x 4, grossly nonfocal Cervical Nodes:  No significant cervical adenopathy Inguinal Nodes: No significant inguinal adenopathy Psychological:  Alert and cooperative. Normal mood and affect  See Assessment and Plan under Problem List

## 2014-05-13 NOTE — Assessment & Plan Note (Signed)
Two-month change in bowel habits.  There no ominous features to her symptoms such as bleeding.  A structural abnormality of the colon is unlikely in view of colonoscopy in 2011.  Recommendations #1 fiber supplementation #2 check stool Hemoccults  Patient was carefully instructed to call back in 3-4 weeks if symptoms are not improved

## 2014-05-13 NOTE — Patient Instructions (Signed)
Go to the basement for your lab kit today  High-Fiber Diet Fiber is found in fruits, vegetables, and grains. A high-fiber diet encourages the addition of more whole grains, legumes, fruits, and vegetables in your diet. The recommended amount of fiber for adult males is 38 g per day. For adult females, it is 25 g per day. Pregnant and lactating women should get 28 g of fiber per day. If you have a digestive or bowel problem, ask your caregiver for advice before adding high-fiber foods to your diet. Eat a variety of high-fiber foods instead of only a select few type of foods.  PURPOSE  To increase stool bulk.  To make bowel movements more regular to prevent constipation.  To lower cholesterol.  To prevent overeating. WHEN IS THIS DIET USED?  It may be used if you have constipation and hemorrhoids.  It may be used if you have uncomplicated diverticulosis (intestine condition) and irritable bowel syndrome.  It may be used if you need help with weight management.  It may be used if you want to add it to your diet as a protective measure against atherosclerosis, diabetes, and cancer. SOURCES OF FIBER  Whole-grain breads and cereals.  Fruits, such as apples, oranges, bananas, berries, prunes, and pears.  Vegetables, such as green peas, carrots, sweet potatoes, beets, broccoli, cabbage, spinach, and artichokes.  Legumes, such split peas, soy, lentils.  Almonds. FIBER CONTENT IN FOODS Starches and Grains / Dietary Fiber (g)  Cheerios, 1 cup / 3 g  Corn Flakes cereal, 1 cup / 0.7 g  Rice crispy treat cereal, 1 cup / 0.3 g  Instant oatmeal (cooked),  cup / 2 g  Frosted wheat cereal, 1 cup / 5.1 g  Brown, long-grain rice (cooked), 1 cup / 3.5 g  White, long-grain rice (cooked), 1 cup / 0.6 g  Enriched macaroni (cooked), 1 cup / 2.5 g Legumes / Dietary Fiber (g)  Baked beans (canned, plain, or vegetarian),  cup / 5.2 g  Kidney beans (canned),  cup / 6.8 g  Pinto beans  (cooked),  cup / 5.5 g Breads and Crackers / Dietary Fiber (g)  Plain or honey graham crackers, 2 squares / 0.7 g  Saltine crackers, 3 squares / 0.3 g  Plain, salted pretzels, 10 pieces / 1.8 g  Whole-wheat bread, 1 slice / 1.9 g  White bread, 1 slice / 0.7 g  Raisin bread, 1 slice / 1.2 g  Plain bagel, 3 oz / 2 g  Flour tortilla, 1 oz / 0.9 g  Corn tortilla, 1 small / 1.5 g  Hamburger or hotdog bun, 1 small / 0.9 g Fruits / Dietary Fiber (g)  Apple with skin, 1 medium / 4.4 g  Sweetened applesauce,  cup / 1.5 g  Banana,  medium / 1.5 g  Grapes, 10 grapes / 0.4 g  Orange, 1 small / 2.3 g  Raisin, 1.5 oz / 1.6 g  Melon, 1 cup / 1.4 g Vegetables / Dietary Fiber (g)  Green beans (canned),  cup / 1.3 g  Carrots (cooked),  cup / 2.3 g  Broccoli (cooked),  cup / 2.8 g  Peas (cooked),  cup / 4.4 g  Mashed potatoes,  cup / 1.6 g  Lettuce, 1 cup / 0.5 g  Corn (canned),  cup / 1.6 g  Tomato,  cup / 1.1 g Document Released: 08/05/2005 Document Revised: 02/04/2012 Document Reviewed: 11/07/2011 ExitCare Patient Information 2015 [place], [place]. This information is not intended to  replace advice given to you by your health care provider. Make sure you discuss any questions you have with your health care provider.    Call back in 3-4 weeks if no better

## 2014-05-23 ENCOUNTER — Encounter: Payer: Self-pay | Admitting: Family Medicine

## 2014-05-23 ENCOUNTER — Other Ambulatory Visit (INDEPENDENT_AMBULATORY_CARE_PROVIDER_SITE_OTHER): Payer: Medicare Other

## 2014-05-23 DIAGNOSIS — R194 Change in bowel habit: Secondary | ICD-10-CM

## 2014-05-23 LAB — FECAL OCCULT BLOOD, IMMUNOCHEMICAL: FECAL OCCULT BLD: NEGATIVE

## 2014-05-24 NOTE — Progress Notes (Signed)
Quick Note:  Please inform the patient that Hemoccult was normal. No further GI workup at this time ______

## 2014-06-01 ENCOUNTER — Ambulatory Visit (INDEPENDENT_AMBULATORY_CARE_PROVIDER_SITE_OTHER): Payer: Medicare Other

## 2014-06-01 DIAGNOSIS — Z23 Encounter for immunization: Secondary | ICD-10-CM

## 2014-07-28 ENCOUNTER — Other Ambulatory Visit (INDEPENDENT_AMBULATORY_CARE_PROVIDER_SITE_OTHER): Payer: Medicare Other

## 2014-07-28 DIAGNOSIS — Z0000 Encounter for general adult medical examination without abnormal findings: Secondary | ICD-10-CM

## 2014-07-28 DIAGNOSIS — K5900 Constipation, unspecified: Secondary | ICD-10-CM

## 2014-07-28 DIAGNOSIS — R194 Change in bowel habit: Secondary | ICD-10-CM

## 2014-07-28 LAB — HEPATIC FUNCTION PANEL
ALK PHOS: 88 U/L (ref 39–117)
ALT: 29 U/L (ref 0–35)
AST: 26 U/L (ref 0–37)
Albumin: 3.8 g/dL (ref 3.5–5.2)
BILIRUBIN DIRECT: 0 mg/dL (ref 0.0–0.3)
Total Bilirubin: 0.6 mg/dL (ref 0.2–1.2)
Total Protein: 6.8 g/dL (ref 6.0–8.3)

## 2014-07-28 LAB — BASIC METABOLIC PANEL
BUN: 19 mg/dL (ref 6–23)
CALCIUM: 9.2 mg/dL (ref 8.4–10.5)
CHLORIDE: 104 meq/L (ref 96–112)
CO2: 28 mEq/L (ref 19–32)
CREATININE: 0.7 mg/dL (ref 0.4–1.2)
GFR: 86.88 mL/min (ref >60.00)
Glucose, Bld: 92 mg/dL (ref 70–99)
Potassium: 5.2 mEq/L — ABNORMAL HIGH (ref 3.5–5.1)
Sodium: 137 mEq/L (ref 135–145)

## 2014-07-28 LAB — CBC WITH DIFFERENTIAL/PLATELET
Basophils Absolute: 0.1 10*3/uL (ref 0.0–0.1)
Basophils Relative: 1.5 % (ref 0.0–3.0)
EOS ABS: 0.2 10*3/uL (ref 0.0–0.7)
EOS PCT: 2.9 % (ref 0.0–5.0)
HCT: 42.3 % (ref 36.0–46.0)
Hemoglobin: 14 g/dL (ref 12.0–15.0)
LYMPHS PCT: 31.6 % (ref 12.0–46.0)
Lymphs Abs: 1.8 10*3/uL (ref 0.7–4.0)
MCHC: 33.1 g/dL (ref 30.0–36.0)
MCV: 95.6 fl (ref 78.0–100.0)
Monocytes Absolute: 0.6 10*3/uL (ref 0.1–1.0)
Monocytes Relative: 10.2 % (ref 3.0–12.0)
NEUTROS PCT: 53.8 % (ref 43.0–77.0)
Neutro Abs: 3 10*3/uL (ref 1.4–7.7)
PLATELETS: 242 10*3/uL (ref 150.0–400.0)
RBC: 4.42 Mil/uL (ref 3.87–5.11)
RDW: 12.3 % (ref 11.5–15.5)
WBC: 5.7 10*3/uL (ref 4.0–10.5)

## 2014-07-28 LAB — POCT URINALYSIS DIPSTICK
Bilirubin, UA: NEGATIVE
GLUCOSE UA: NEGATIVE
Ketones, UA: NEGATIVE
NITRITE UA: NEGATIVE
PH UA: 7
Protein, UA: NEGATIVE
Spec Grav, UA: 1.01
Urobilinogen, UA: 0.2

## 2014-07-28 LAB — LIPID PANEL
CHOL/HDL RATIO: 4
Cholesterol: 311 mg/dL — ABNORMAL HIGH (ref 0–200)
HDL: 76.7 mg/dL (ref >39.00)
LDL CALC: 222 mg/dL — AB (ref 0–99)
NonHDL: 234.3
TRIGLYCERIDES: 64 mg/dL (ref 0.0–149.0)
VLDL: 12.8 mg/dL (ref 0.0–40.0)

## 2014-07-28 LAB — TSH: TSH: 1.32 u[IU]/mL (ref 0.35–4.50)

## 2014-08-04 ENCOUNTER — Encounter: Payer: Self-pay | Admitting: Family Medicine

## 2014-08-11 ENCOUNTER — Other Ambulatory Visit: Payer: Self-pay | Admitting: Family Medicine

## 2014-09-13 ENCOUNTER — Encounter: Payer: Self-pay | Admitting: Family Medicine

## 2014-09-13 ENCOUNTER — Ambulatory Visit (INDEPENDENT_AMBULATORY_CARE_PROVIDER_SITE_OTHER): Payer: Medicare Other | Admitting: Family Medicine

## 2014-09-13 VITALS — BP 102/74 | Temp 97.9°F | Ht 64.0 in | Wt 128.6 lb

## 2014-09-13 DIAGNOSIS — F341 Dysthymic disorder: Secondary | ICD-10-CM

## 2014-09-13 DIAGNOSIS — F0280 Dementia in other diseases classified elsewhere without behavioral disturbance: Secondary | ICD-10-CM

## 2014-09-13 DIAGNOSIS — G309 Alzheimer's disease, unspecified: Secondary | ICD-10-CM

## 2014-09-13 MED ORDER — MEMANTINE HCL ER 28 MG PO CP24: 28.0000 mg | ORAL_CAPSULE | Freq: Every day | ORAL | Status: DC

## 2014-09-13 MED ORDER — DONEPEZIL HCL 10 MG PO TABS: 10.0000 mg | ORAL_TABLET | Freq: Every day | ORAL | Status: DC

## 2014-09-13 MED ORDER — CITALOPRAM HYDROBROMIDE 20 MG PO TABS: 20.0000 mg | ORAL_TABLET | Freq: Every day | ORAL | Status: DC

## 2014-09-13 NOTE — Progress Notes (Signed)
Pre visit review using our clinic review tool, if applicable. No additional management support is needed unless otherwise documented below in the visit note. 

## 2014-09-13 NOTE — Progress Notes (Signed)
   Subjective:    Patient ID: Kim Hernandez, female    DOB: 07/12/1941, 74 y.o.   MRN: [id number]  HPI [name] is a 74 year old married female nonsmoker who comes in today accompanied by her husband for evaluation of Alzheimer's disease  She's currently taking Aricept 10 mg daily new Mendy a 28 mg daily and Celexa 20 mg daily and an aspirin tablet. Overall she seems to be stable  Recommend routine eye care and dental care colonoscopies mammography no longer needed  Cognitive function stable, home health safety reviewed no issues identified, no guns in the house, she does have a healthcare power of attorney and living well. She exercises by walking with her husband.   Review of Systems  Constitutional: Negative.   HENT: Negative.   Eyes: Negative.   Respiratory: Negative.   Cardiovascular: Negative.   Gastrointestinal: Negative.   Endocrine: Negative.   Genitourinary: Negative.   Musculoskeletal: Negative.   Skin: Negative.   Allergic/Immunologic: Negative.   Neurological: Negative.   Hematological: Negative.   Psychiatric/Behavioral: Negative.        Objective:   Physical Exam  Constitutional: She is oriented to person, place, and time. She appears well-developed and well-nourished.  HENT:  Head: Normocephalic and atraumatic.  Right Ear: External ear normal.  Left Ear: External ear normal.  Nose: Nose normal.  Mouth/Throat: Oropharynx is clear and moist.  Eyes: EOM are normal. Pupils are equal, round, and reactive to light.  Neck: Normal range of motion. Neck supple. No JVD present. No tracheal deviation present. No thyromegaly present.  Cardiovascular: Normal rate, regular rhythm, normal heart sounds and intact distal pulses.  Exam reveals no gallop and no friction rub.   No murmur heard. Pulmonary/Chest: Effort normal and breath sounds normal. No stridor. No respiratory distress. She has no wheezes. She has no rales. She exhibits no tenderness.  Abdominal: Soft. Bowel  sounds are normal. She exhibits no distension and no mass. There is no tenderness. There is no rebound and no guarding.  Musculoskeletal: Normal range of motion.  Lymphadenopathy:    She has no cervical adenopathy.  Neurological: She is alert and oriented to person, place, and time. She has normal reflexes. No cranial nerve deficit. She exhibits normal muscle tone. Coordination normal.  Skin: Skin is warm and dry. No rash noted. No erythema. No pallor.  Psychiatric: She has a normal mood and affect. Her behavior is normal. Judgment and thought content normal.  Nursing note and vitals reviewed.         Assessment & Plan:  Alzheimer's disease....... stable on medication....... continue medication follow-up in one year

## 2014-09-19 ENCOUNTER — Telehealth: Payer: Self-pay

## 2014-09-19 NOTE — Telephone Encounter (Signed)
[facility] Night - Client TELEPHONE ADVICE RECORD [facility] Patient Name: Kim Hernandez Gender: Female DOB: 23-May-1941 Age: 74 Y 4 M 24 D Return Phone Number: [phone] (Primary) Address: City/State/Zip: [place] [address] Client [facility] Night - Client Client Site [facility] - Night Physician Alonza Smoker Contact Type Call Call Type Triage / Clinical Caller Name Aneta Mins Relationship To Patient Spouse Return Phone Number [phone] (Primary) Chief Complaint FAINTING or PASSING OUT Initial Comment Caller states wife started to go to bed at 9 pm last night which is early for her. She fainted during a BM and was unconscious, was distressed and making strange sounds, she vomited , and diarrhea, feeling weak and light headed PreDisposition Did not know what to do Nurse Assessment Nurse: Annye English, RN, Angelique Blonder Date/Time (Eastern Time): 09/18/2014 7:35:42 AM Confirm and document reason for call. If symptomatic, describe symptoms. ---Caller states wife started to go to bed at 9 pm last night which is early for her. She fainted during a BM last night and was unconscious, was distressed and making strange sounds, she vomited , and diarrhea, feeling weak and light headed. States this am, she is very weak, dizzy, but has not fainted. Has the patient traveled out of the country within the last 30 days? ---Not Applicable Does the patient require triage? ---Yes Related visit to physician within the last 2 weeks? ---N/A Does the PT have any chronic conditions? (i.e. diabetes, asthma, etc.) ---Unknown Guidelines Guideline Title Affirmed Question Affirmed Notes Nurse Date/Time (Eastern Time) Dizziness - Lightheadedness Shock suspected (e.g., cold/pale/clammy skin, too weak to stand, low BP, rapid pulse) Pt diaphoretic, very weak, dizzy. Syncopal episode last night. Carmon, RN, Angelique Blonder 09/18/2014  7:38:25 AM Disp. Time Lamount Cohen Time) Disposition Final User 09/18/2014 7:33:22 AM Send to Urgent Denyce Robert 09/18/2014 7:40:16 AM Send To RN Personal Annye English, RN, Denise 09/18/2014 7:56:14 AM Attempt made - line busy Carmon, RN, Angelique Blonder 09/18/2014 7:56:41 AM Attempt made - line busy Carmon, RN, Angelique Blonder PLEASE NOTE: All timestamps contained within this report are represented as [place] Standard Time. CONFIDENTIALTY NOTICE: This fax transmission is intended only for the addressee. It contains information that is legally privileged, confidential or otherwise protected from use or disclosure. If you are not the intended recipient, you are strictly prohibited from reviewing, disclosing, copying using or disseminating any of this information or taking any action in reliance on or regarding this information. If you have received this fax in error, please notify us immediately by telephone so that we can arrange for its return to [place]. Phone: [phone], Toll-Free: [phone], Fax: [phone] Page: 2 of 2 Call Id: [id number] Disp. Time Lamount Cohen Time) Disposition Final User 09/18/2014 7:59:50 AM Attempt made - line busy Carmon, RN, Angelique Blonder 09/18/2014 8:04:43 AM 911 Follow Up Call Attempted Carmon, RN, Angelique Blonder Reason: 911 cb attempt several times w/busy signal, then IC auto disconnects the phone. ADKJUL attempted and she got the same busy sig. Asked MORVAL to ck the number and she reports the number is correct. MORVAL called the number as well and got a busy signal. 09/18/2014 7:39:33 AM Call EMS 911 Now Yes Carmon, RN, Leighton Ruff Understands: Yes Disagree/Comply: Comply Care Advice Given Per Guideline CALL EMS 911 NOW: Immediate medical attention is needed. You need to hang up and call 911 (or an ambulance). Probation officer Discretion: I'll call you back in a few minutes to be sure you were able to reach them.) FIRST AID: Lie down with the  feet elevated (Reason: counteract shock) CARE ADVICE given  per Dizziness (Adult) guideline. After Care Instructions Given Call Event Type User Date / Time Description Comments User: Armando ReichertValesca, Morazan Date/Time Lamount Cohen(Eastern Time): 09/18/2014 8:03:16 AM Call reviewed number is correct per caller. Nurse notified.

## 2014-09-21 ENCOUNTER — Telehealth: Payer: Self-pay | Admitting: Family Medicine

## 2014-09-21 NOTE — Telephone Encounter (Signed)
Pt has diarrhea Sat night w/ vomiting.  Advised by triage to call 911.  They came and checked her out.  bp ok and they did not recommend hospital;  vomiting stopped sat night and diarrhea stopped Sunday. husband states pt is still very weak,. Wants to know if this is "normal"  Pt is not week ALL the time, most of the time.  Is this unusual?

## 2014-09-22 NOTE — Telephone Encounter (Signed)
Spoke with husband

## 2014-10-10 ENCOUNTER — Encounter: Payer: Self-pay | Admitting: Family Medicine

## 2014-10-14 ENCOUNTER — Encounter: Payer: Self-pay | Admitting: Neurology

## 2014-10-14 ENCOUNTER — Ambulatory Visit (INDEPENDENT_AMBULATORY_CARE_PROVIDER_SITE_OTHER): Payer: Medicare Other | Admitting: Neurology

## 2014-10-14 VITALS — BP 103/63 | HR 74 | Resp 16 | Ht 62.0 in | Wt 130.0 lb

## 2014-10-14 DIAGNOSIS — G309 Alzheimer's disease, unspecified: Secondary | ICD-10-CM | POA: Diagnosis not present

## 2014-10-14 DIAGNOSIS — R270 Ataxia, unspecified: Secondary | ICD-10-CM

## 2014-10-14 DIAGNOSIS — F0280 Dementia in other diseases classified elsewhere without behavioral disturbance: Secondary | ICD-10-CM

## 2014-10-14 NOTE — Progress Notes (Signed)
[facility]  Provider:  Dr Malli Falotico Referring Provider: Roderick Peeodd, Jeffrey A, MD Primary Care Physician:  Evette GeorgesDD,JEFFREY ALLEN, MD  Chief Complaint  Patient presents with  . Alzheimer's    RM 11 -    HPI:  Kim Hernandez is a 74 y.o. female here as a referral from Dr. Tawanna Coolerodd for memory loss.      10-14-14 Kim Hernandez is a right-handed, married Caucasian female patient presented in early 2009 for memory evaluations, and  she  started on Aricept and reported no side effects from the medication.  She and her husband  ae here together.   She reports no sundowning,  no change in weight.  no change in smell or taste,  has a good appetite and is socially active as well as physically active. Kim Hernandez is able to orient herself in time frame throughout the day, knows when its morning or afternoon.  she knows the date, but today stated it is Mob nday, when it was actually Friday.    She remains completely independent in her daily life with all activities of daily living. She did get lost twice   and therefore stopped driving alone.  In May 2012 the patient underwent a PET scan which showed the evidence the distribution of angles indicative of Alzheimer's her short-term memory is still the only deficits sometime she had trouble naming people or misplacing things she scored 28/30 on a Mini-Mental test in November 2012 her Mini-Mental Status Examination was 25 points and her animal fluency test 21 points  .In August  2014 her Mini-Mental Status Examination was 22 and animal fluency test 20 be also added a MOCA , in which  she did very well- with the trail making and visual spatial demands of that test, and  to 24 words.  She was quite frustrated with a five-minute recall failure.   I am still playing Scrabble but I just don't win anymore " - today she states she won 2 games recently -satisfied, in  general her  MMSE. was 24 of 30 points AFT 19 points MOCA 20 points. Driving restrictions  were self imposed.  Interval history 10-14-14  Kim Hernandez reports that she has noticed some progression of her cognitive decline in her day-to-day life.  She and her husband both have noted that she was for a long time  still safe to drive  Residentially with a co -pilot / partner in the car,  but that she is no longer comfortable going to the grocery store shopping alone and that she actually relies on her husband's coordination and supervision to get grocery store shopping done. I find it also remarkable that her  MOCA test is again 22 out of 30 points and has really not changed in about 18 months. Numerically she is doing well on these tests repeatedly. She was able to perform a trail making test which is for many patients with early dementia the hardest to perform she had some difficulties today : 1) with drawing a three-dimensional cube image, she could draw a clock face and place the numbers correctly that she then drew the hands of the clock at 5-10 and set of 11:10 he was able to name all 3 animals had excellent attention,  perform subtraction and numeric and word -repetition. Her main difficulty is not orientation or abstruction but the recall of words.    Review of Systems: Out of a complete 14 system review, the patient complains of only the following symptoms,  and all other reviewed systems are negative.  "Getting forgetful" sense of doom, anxiety , depression.  Stopped Celexa, now wants to restart.  She still has the medication at home.   History   Social History  . Marital Status: Married    Spouse Name: N/A  . Number of Children: 2  . Years of Education: N/A   Occupational History  . 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 Smoking status: Never Smoker   . Smokeless tobacco: Never Used  . Alcohol Use: No     Comment: quit- had a problem w/ alcohol and has abstained for years  . Drug Use: No  . Sexual Activity: Not on file   Other Topics  Concern  . Not on file   Social History Narrative   Uses seat belt regularly    Family History  Problem Relation Age of Onset  . Anemia Mother   . Alzheimer's disease Mother   . Emphysema Mother     smoker  . Obesity Sister   . Emphysema Sister     smoker  . Coronary artery disease Brother     smoker  . Heart attack Brother   . Obesity Daughter   . Depression Daughter   . Alcohol abuse Son   . Depression Son   . Suicidality Son     10/12-commited suicide in a manic depressive episode    Past Medical History  Diagnosis Date  . Hyperlipidemia   . Depression     monopolar  . Alcohol abuse   . Arthritis   . Kidney stone   . Migraine     pt denies  . OSTEOPOROSIS 12/17/2007  . OSTEOARTHRITIS, KNEE 05/23/2010  . GERD 05/11/2007    ?  Marland Kitchen DEMENTIA 07/20/2008  . ANXIETY DEPRESSION 05/23/2010  . Alzheimer's disease 05/23/2010    ?  Marland Kitchen Wrist pain, right 04/17/2011  . Memory loss   . Osteopenia     Past Surgical History  Procedure Laterality Date  . Total knee arthroplasty Right   . Tonsillectomy    . Dilation and curettage of uterus      forty years ago    Current Outpatient Prescriptions  Medication Sig Dispense Refill  . aspirin 325 MG tablet Take 162.5 mg by mouth daily.    . Cholecalciferol (VITAMIN D3) 1000 UNITS CAPS Take 1 capsule by mouth daily.      . citalopram (CELEXA) 20 MG tablet Take 1 tablet (20 mg total) by mouth daily. 100 tablet 3  . donepezil (ARICEPT) 10 MG tablet Take 1 tablet (10 mg total) by mouth daily. 100 tablet 3  . memantine (NAMENDA XR) 28 MG CP24 24 hr capsule Take 1 capsule (28 mg total) by mouth daily. 100 capsule 3  . Omega-3 Fatty Acids (FISH OIL) 1200 MG CAPS Take 1 capsule by mouth daily.     No current facility-administered medications for this visit.    Allergies as of 10/14/2014 - Review Complete 10/14/2014  Allergen Reaction Noted  . Poison ivy extract [extract of poison ivy]  03/03/2013    Vitals: BP 103/63 mmHg  Pulse  74  Resp 16  Ht  (1.575 m)  Wt 130 lb (58.968 kg)  BMI 23.77 kg/m2 Last Weight:  Wt Readings from Last 1 Encounters:  10/14/14 130 lb (58.968 kg)   Last Height:   Ht Readings from Last 1 Encounters:  10/14/14  (  1.575 m)     Physical exam:  General: The patient is awake, alert and appears not in acute distress. The patient is well groomed. Head: Normocephalic, atraumatic. Neck is supple. Mallampati 2 , neck circumference: 13.5 ,  Cardiovascular:  Regular rate and rhythm, without  murmurs or carotid bruit, and without distended neck veins. Respiratory: Lungs are clear to auscultation. Skin:  Without evidence of edema, or rash Trunk: BMI is normal posture.  Neurologic exam : The patient is awake and alert, oriented to place and time.   Memory subjective described as impaired , MMSE .  There is a normal attention span & concentration ability.  Speech is fluent without  dysarthria, dysphonia - occasional  aphasia. Mood and affect are appropriate. She is reflecting on her sense of doom spells.   Cranial nerves: Pupils are equal and briskly reactive to light. Funduscopic exam without  evidence of pallor or edema.  Extraocular movements  in vertical and horizontal planes intact and without nystagmus. Visual fields by finger perimetry are intact. Hearing to finger rub intact. Facial sensation intact to fine touch. Facial motor strength is symmetric and tongue and uvula move midline.  Motor exam:   Normal tone and normal muscle bulk and symmetric normal strength in all extremities.  No parkinsonism.    Sensory:  Fine touch, pinprick and vibration were tested in all extremities. Proprioception is normal.  Coordination: Rapid alternating movements in the fingers/hands is tested and normal.  Finger-to-nose maneuver  With new evidence of ataxia, dysmetria and  Tremor on the left side. Mild pronator drift .  Gait and station: Patient walks without assistive device and is able  to climb up to the exam table.  Not bracing herself - rises easily, Strength within normal limits. Stance is stable and normal. Steps are unfragmented. Romberg testing is normal.  Deep tendon reflexes: in the  upper and lower extremities are symmetric and intact. Babinski maneuver unable to test,  she is extremely ticklish !.  Assessment:  After physical and neurologic examination, review of laboratory studies, imaging, neurophysiology testing and pre-existing records, assessment will be reviewed on the problem list.  Able to hold her cognitive examination results/ scores between 22 and to 28 Points on Mini-Mental test over the last 2-1/2 years,   Today's MOCA test was 22 points , is identical to January 2014. Her husband mentioned that on the last day of January 2016 his wife had a severe bout of diarrhea, vomiting and passed out, fell off the commode.  He did mention that it looked like a noro virus infection. But what is unusually is that Mrs. Pasko  hydrates very well every day and that she still has dizzy spells every morning for  1-2 hours,  in the morning she is not steady on her feet.  She has to hold onto furniture to move study she walks down the hallway of her house by holding onto the wall for example.   Plan:  Treatment plan and additional workup will be reviewed under Problem List.  MRI to rule out cerebellar infection.  Dr. Tawanna Cooler , with request to evalute orthostatic changes in AM.  The patient continue on her current medication regimen which he tolerates very well, they have been no behavioral changes or personality changes. 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   follow up every 6 month with me , 30 minutes, MMSE and MOCA.

## 2014-10-18 ENCOUNTER — Encounter: Payer: Self-pay | Admitting: Neurology

## 2014-10-26 ENCOUNTER — Ambulatory Visit
Admission: RE | Admit: 2014-10-26 | Discharge: 2014-10-26 | Disposition: A | Discharge status: Home / Self Care | Payer: Medicare Other | Source: Ambulatory Visit | Attending: Neurology | Admitting: Neurology

## 2014-10-26 ENCOUNTER — Telehealth: Payer: Self-pay | Admitting: Neurology

## 2014-10-26 DIAGNOSIS — H8112 Benign paroxysmal vertigo, left ear: Secondary | ICD-10-CM

## 2014-10-26 DIAGNOSIS — G309 Alzheimer's disease, unspecified: Principal | ICD-10-CM

## 2014-10-26 DIAGNOSIS — R270 Ataxia, unspecified: Secondary | ICD-10-CM

## 2014-10-26 DIAGNOSIS — F0280 Dementia in other diseases classified elsewhere without behavioral disturbance: Secondary | ICD-10-CM

## 2014-10-26 MED ORDER — GADOBENATE DIMEGLUMINE 529 MG/ML IV SOLN
10.0000 mL | Freq: Once | INTRAVENOUS | Status: AC | PRN
Start: 2014-10-26 — End: 2014-10-26
  Administered 2014-10-26: 10 mL via INTRAVENOUS

## 2014-10-26 NOTE — Telephone Encounter (Signed)
I called the patient. The MRI shows some small vessel disease that includes the pontine area, this may be a source of dizziness. There is atrophy consistent with the diagnosis of Alzheimer's disease, no acute strokes are seen. The family would like Dr. Vickey Hugerohmeier to call them.   MRI brain 10/26/2014:  IMPRESSION:  Abnormal MRI brain (with and without) demonstrating: 1. Moderate diffuse and severe left mesial temporal atrophy. Moderate ventriculomegaly on ex vacuo basis. 2. Moderate periventricular and subcortical and pontine chronic small vessel ischemic disease. 3. No acute findings.

## 2014-10-27 ENCOUNTER — Encounter: Payer: Self-pay | Admitting: Neurology

## 2014-10-27 NOTE — Addendum Note (Signed)
Addended by: Melvyn NovasHMEIER, Taler Kushner on: 10/27/2014 12:05 PM   Modules accepted: Orders

## 2014-10-27 NOTE — Telephone Encounter (Signed)
Already wrote a note to MR. Reily on my chart directly , as he had requested. called the patient. The MRI shows some small vessel disease that includes the pontine area, this may be a source of dizziness. There is atrophy consistent with the diagnosis of Alzheimer's disease, no acute strokes are seen.  MRI brain 10/26/2014:  IMPRESSION:  Abnormal MRI brain (with and without) demonstrating: 1. Moderate diffuse and severe left mesial temporal atrophy. Moderate ventriculomegaly on ex vacuo basis. 2. Moderate periventricular and subcortical and pontine chronic small vessel ischemic disease. 3. No acute findings.

## 2014-10-27 NOTE — Telephone Encounter (Signed)
i spoke to the patient and her husband,  they would like to go ahead with vestibular rehab, she had no vertigo since the MRI !!

## 2014-10-27 NOTE — Telephone Encounter (Signed)
I am not sure , who that should have been ! I will research. Please bare with me. C D  Dear Mr. Kim Hernandez,  The MRI does not show anything to explain the disease spells. What it does show is brain atrophy and the area of the sylvian fissure appears widened, meaning that the surrounding brain tissue has shrunken. This is consistent with memory loss disorder and usually seen in  Alzheimer's type dementia. The MRI helped us to rule out  tumor or stroke or anything else that would have caused the vertigo-dizziness to come on. Fortunately, none of these causes were found. That does not mean that her dizziness is not treatable at all.  I hope this clarification helps, sincerely,  Porfirio Kim Hernandez

## 2015-01-25 ENCOUNTER — Encounter: Payer: Self-pay | Admitting: Internal Medicine

## 2015-01-25 ENCOUNTER — Ambulatory Visit (INDEPENDENT_AMBULATORY_CARE_PROVIDER_SITE_OTHER): Payer: Medicare Other | Admitting: Internal Medicine

## 2015-01-25 VITALS — BP 106/64 | HR 59 | Temp 98.1°F | Resp 14 | Ht 62.0 in | Wt 128.1 lb

## 2015-01-25 DIAGNOSIS — F0280 Dementia in other diseases classified elsewhere without behavioral disturbance: Secondary | ICD-10-CM

## 2015-01-25 DIAGNOSIS — G309 Alzheimer's disease, unspecified: Secondary | ICD-10-CM

## 2015-01-25 DIAGNOSIS — F325 Major depressive disorder, single episode, in full remission: Secondary | ICD-10-CM

## 2015-01-25 DIAGNOSIS — F324 Major depressive disorder, single episode, in partial remission: Secondary | ICD-10-CM | POA: Diagnosis not present

## 2015-01-25 NOTE — Progress Notes (Signed)
   Subjective:    Patient ID: Kim Hernandez, female    DOB: 01/01/1941, 74 y.o.   MRN: [id number]  HPI The patient is a 74 YO female who is coming in to follow up on her alzheimer's disease. She has been on namenda and aricept. She does not notice any difference although her husband (helps to provide history) thinks it is helping to slow her decline. No wandering or behavioral problems. She is active outside and mows the lawn. No new complaints. No side effects or nausea or diarrhea from the medicine.   Review of Systems  Constitutional: Negative.   Respiratory: Negative.   Cardiovascular: Negative.   Gastrointestinal: Negative.   Musculoskeletal: Negative.   Skin: Negative.   Neurological: Negative for dizziness, syncope, speech difficulty, weakness, light-headedness and headaches.       Memory change  Psychiatric/Behavioral: Negative.       Objective:   Physical Exam  Constitutional: She is oriented to person, place, and time. She appears well-developed and well-nourished.  HENT:  Head: Normocephalic and atraumatic.  Eyes: EOM are normal.  Neck: Normal range of motion.  Cardiovascular: Normal rate and regular rhythm.   Pulmonary/Chest: Effort normal and breath sounds normal.  Abdominal: Soft. She exhibits no distension. There is no tenderness.  Musculoskeletal: She exhibits no edema.  Neurological: She is alert and oriented to person, place, and time. Coordination normal.  Skin: Skin is warm and dry.   Filed Vitals:   01/25/15 0858  BP: 106/64  Pulse: 59  Temp: 98.1 F (36.7 C)  TempSrc: Oral  Resp: 14  Height: 5\' 2"  (1.575 m)  Weight: 128 lb 1.9 oz (58.115 kg)  SpO2: 97%     Assessment & Plan:

## 2015-01-25 NOTE — Assessment & Plan Note (Signed)
Has done well with celexa over the years and is in remission. Due to concurrent memory problems would continue as to not cloud the picture.

## 2015-01-25 NOTE — Progress Notes (Signed)
Pre visit review using our clinic review tool, if applicable. No additional management support is needed unless otherwise documented below in the visit note. 

## 2015-01-25 NOTE — Assessment & Plan Note (Signed)
Tolerating namenda and aricpet well without side effects. She does seem to be getting some benefit from them and will continue. Return for physical and will continue to follow with neurology. MRI reviewed at visit and had moderate atrophy and small vessel disease but with no acute changes. Talked to her about memory strategies and working with her brain.

## 2015-01-25 NOTE — Patient Instructions (Signed)
You are doing well today and we will see you back for your physical.   It was nice to meet you today and if you have any problems or questions before then please feel free to call the office.

## 2015-02-13 ENCOUNTER — Encounter: Payer: Self-pay | Admitting: Internal Medicine

## 2015-03-08 ENCOUNTER — Encounter: Payer: Self-pay | Admitting: Internal Medicine

## 2015-03-08 ENCOUNTER — Ambulatory Visit (INDEPENDENT_AMBULATORY_CARE_PROVIDER_SITE_OTHER): Payer: Medicare Other | Admitting: Internal Medicine

## 2015-03-08 VITALS — BP 122/82 | HR 53 | Temp 97.5°F | Resp 18 | Wt 127.5 lb

## 2015-03-08 DIAGNOSIS — G309 Alzheimer's disease, unspecified: Secondary | ICD-10-CM | POA: Diagnosis not present

## 2015-03-08 DIAGNOSIS — I951 Orthostatic hypotension: Secondary | ICD-10-CM | POA: Diagnosis not present

## 2015-03-08 DIAGNOSIS — F0280 Dementia in other diseases classified elsewhere without behavioral disturbance: Secondary | ICD-10-CM

## 2015-03-08 DIAGNOSIS — H6121 Impacted cerumen, right ear: Secondary | ICD-10-CM

## 2015-03-08 NOTE — Patient Instructions (Signed)
Please do not use Q-tips as this simply packs the wax down against he eardrum. Should wax build up occur, please put 2-3 drops of mineral oil in the ear at night and cover the canal with a  cotton ball.In the morning fill the canal with hydrogen peroxide & leave  for 10-15 minutes.Following this shower and use the thinnest washrag available to wick out the wax.   Perform isometric exercise of calves  ( while seated go up on toes to count of 5 & then onto heels for 5 count). Repeat  4- 5 times prior to standing if you've been seated or supine for any significant period of time as BP drops with such positions.

## 2015-03-08 NOTE — Progress Notes (Signed)
   Subjective:    Patient ID: Kim Hernandez, female    DOB: 03/01/1941, 74 y.o.   MRN: [id number]  HPI Beginning 7/14 or 7/15 she's been having dizziness mainly in the mornings upon standing. This actually started in January after contracting Norovirus. Her husband had a similar illness but his symptoms resolved very quickly. Her symptoms of dizziness persisted and she saw her neurologist who performed MRI3/13/16 which revealed significant atrophy and ventricular enlargement with small vessel ischemic changes. There was no acute process. At that time she was to be referred to physical therapy for vertigo therapy. By the time that appointment was made she was asymptomatic.  Her husband is concerned because she was working in the kitchen and realized she had to go to the bathroom. She had a bowel movement prior to reaching the bathroom. He states that she has been hyper focused on tasks for years.  She cannot describe any possible cardiac or neurologic prodrome prior to the symptoms as she states "I've got Alzheimer's". She denies any other neuromuscular symptoms stating the same reason.   Review of Systems  Denied were any change in heart rhythm or rate prior to the event. There was no associated chest pain or shortness of breath .  Also specifically denied prior to the episode were headache, limb weakness, tingling, or numbness. No seizure activity noted. No significant headaches. Mental status change or memory loss denied. Blurred vision , diplopia or vision loss absent. Vertigo, near syncope or imbalance denied. There is no numbness, tingling, or weakness in extremities.   No loss of control of bladder. Radicular type pain absent. No seizure stigmata.    Objective:   Physical Exam Pertinent or positive findings include: She is bright and animated but repeatedly mentions Alzheimer's as a cause of her memory deficit. She was working on Guardian Life Insurancecrossword puzzle as we entered the room. The  entries were correct.No cranial nerve deficit. Gait normal.Wax occludes R otic canal.  General appearance :adequately nourished; in no distress.  Eyes: No conjunctival inflammation or scleral icterus is present.  Oral exam:  Lips and gums are healthy appearing.There is no oropharyngeal erythema or exudate noted. Dental hygiene is good.  Heart:  Normal rate and regular rhythm. S1 and S2 normal without gallop, murmur, click, rub or other extra sounds    Lungs:Chest clear to auscultation; no wheezes, rhonchi,rales ,or rubs present.No increased work of breathing.   Abdomen: bowel sounds normal, soft and non-tender without masses, organomegaly or hernias noted.  No guarding or rebound.   Vascular : all pulses equal ; no bruits present.  Skin:Warm & dry.  Intact without suspicious lesions or rashes ; no tenting or jaundice   Lymphatic: No lymphadenopathy is noted about the head, neck, axilla.   Neuro: Strength, tone & DTRs normal.                Assessment & Plan:  #1 postural hypotension  #2 bowel incontinence related to distraction  #3 Alzheimer's  #4 wax impaction  Plan: See orders and recommendations

## 2015-03-08 NOTE — Progress Notes (Signed)
Pre visit review using our clinic review tool, if applicable. No additional management support is needed unless otherwise documented below in the visit note. 

## 2015-03-31 ENCOUNTER — Telehealth: Payer: Self-pay | Admitting: *Deleted

## 2015-03-31 NOTE — Telephone Encounter (Signed)
Left msg on triage stating pt is having the vertigo sxs that are lasting longer than before. Requesting md advisement to help with the dizziness. MD out of office pls advise....Raechel Chute

## 2015-04-01 NOTE — Telephone Encounter (Signed)
I have reviewed recent note per Dr Alwyn Ren  I would have nothing else to offer, except for advisement to avoid dehydration by drinking if thirsty, but also to sit for 30 sec after lying down before standing, and always stand up slowly, and do not make sharp turns with walking

## 2015-04-03 ENCOUNTER — Encounter: Payer: Self-pay | Admitting: Physical Therapy

## 2015-04-03 ENCOUNTER — Ambulatory Visit: Payer: Medicare Other | Attending: Neurology | Admitting: Physical Therapy

## 2015-04-03 DIAGNOSIS — H8110 Benign paroxysmal vertigo, unspecified ear: Secondary | ICD-10-CM

## 2015-04-03 NOTE — Telephone Encounter (Signed)
Receive call back from husband he stated wife been having vertigo sxs they have made appt to see her neurologist this am.../lmb

## 2015-04-03 NOTE — Telephone Encounter (Signed)
Called pt/husband no answer LMOM RTC...Raechel Chute

## 2015-04-03 NOTE — Therapy (Signed)
[facility] [address] [place], [place], [address] Phone: [phone]   Fax:  [phone]  Physical Therapy Evaluation  Patient Details  Name: Kim Hernandez MRN: [id number] Date of Birth: 22-Jan-1941 Referring Provider:  Melvyn Novas, MD  Encounter Date: 04/03/2015      PT End of Session - 04/03/15 1707    Visit Number 1  G1   Number of Visits 1  eval only at this time unless BPPV re-occurs   Authorization Type [employer] Medicare   Authorization Time Period 04-03-15 - 06-02-15   PT Start Time 0805   PT Stop Time 0846   PT Time Calculation (min) 41 min      Past Medical History  Diagnosis Date  . Hyperlipidemia   . Depression     monopolar  . Alcohol abuse   . Arthritis   . Kidney stone   . Migraine     pt denies  . OSTEOPOROSIS 12/17/2007  . OSTEOARTHRITIS, KNEE 05/23/2010  . GERD 05/11/2007    ?  Marland Kitchen DEMENTIA 07/20/2008  . ANXIETY DEPRESSION 05/23/2010  . Alzheimer's disease 05/23/2010    ?  Marland Kitchen Wrist pain, right 04/17/2011  . Memory loss   . Osteopenia     Past Surgical History  Procedure Laterality Date  . Total knee arthroplasty Right   . Tonsillectomy    . Dilation and curettage of uterus      forty years ago    There were no vitals filed for this visit.  Visit Diagnosis:  Benign paroxysmal positional vertigo, unspecified laterality - Plan: PT plan of care cert/re-cert      Subjective Assessment - 04/03/15 1639    Subjective Pt.'s husband reports pt had initial episode of vertigo that started with Noravirus in the winter of 2016; most recent episode of vertigo occurred Friday afternoon which lasted less than an hour; pt. had an episode a few weeks ago and she went to see Dr. Alwyn Ren  - pt. was diagnosed with postural hypotension   Patient is accompained by: Family member   Pertinent History osteoporosis, postural hypotension, Alzheimer's, syncopal episode   Patient Stated Goals resolve vertigo  - if BPPV still present   Currently in Pain? No/denies            [facility] PT Assessment - 04/03/15 6962    Assessment   Medical Diagnosis BPPV   Onset Date/Surgical Date 03/31/15  previous episode approx. 2 months ago   Precautions   Precautions None   Balance Screen   Has the patient fallen in the past 6 months Yes   How many times? 1   Has the patient had a decrease in activity level because of a fear of falling?  No   Is the patient reluctant to leave their home because of a fear of falling?  No   Ambulation/Gait   Ambulation/Gait Yes   Ambulation/Gait Assistance 6: Modified independent (Device/Increase time)   Ambulation Distance (Feet) 100 Feet   Assistive device --  none   Gait Pattern Within Functional Limits   Ambulation Surface Level;Indoor            Vestibular Assessment - 04/03/15 0825    Vestibular Assessment   General Observation pt. is a 74 year old lady accompanied to PT by her husband; pt reports no vertigo since Friday afternoon and states it only last for a short while at that tme; pt states she mowed the yard and became dizzy after this  task, went and lied down less than an hour and vertigo was gone when she got up                                             Symptom Behavior   Type of Dizziness Not applicable   Duration of Dizziness less than an hour on Friday   Aggravating Factors No known aggravating factors   Relieving Factors Lying supine   Occulomotor Exam   Occulomotor Alignment Normal   Positional Testing   Dix-Hallpike Dix-Hallpike Right;Dix-Hallpike Left   Sidelying Test Sidelying Right;Sidelying Left   Dix-Hallpike Right   Dix-Hallpike Right Duration none   Dix-Hallpike Left   Dix-Hallpike Left Duration none   Sidelying Right   Sidelying Right Duration none   Sidelying Left   Sidelying Left Duration none   Positional Sensitivities   Sit to Supine No dizziness   Supine to Left Side No dizziness   Supine to Right Side No dizziness    Supine to Sitting No dizziness   Up from Right Hallpike No dizziness   Up from Left Hallpike No dizziness   Rolling Right No dizziness   Rolling Left No dizziness        Self Care; discussed symptoms of BPPV and described etiology of BPPV to pt and husband; instructed in ankle pumps And marching in standing to assist in raising BP if "dizziness" is result of postural hypotension rather than BPPV; No c/o dizziness which could be duplicated with positional testing at initial eval - pt denies vertigo- therefore, PT is  Not warranted at this time - will place pt on hold for 60 days with instruction to call ASAP for appt. If she wishes to be seen Should vertigo re-occur; husband verbalized understanding and agreement with plan               PT Education - 2015-04-14 1706    Education provided Yes   Education Details BPPV handout on etiology of BPPV;   Person(s) Educated Patient;Spouse   Methods Explanation;Demonstration;Handout   Comprehension Verbalized understanding;Returned demonstration                    Plan - Apr 14, 2015 1901    PT Treatment/Interventions ADLs/Self Care Home Management;Canalith Repostioning;Functional mobility training;Vestibular;Therapeutic exercise;Therapeutic activities;Neuromuscular re-education;Balance training   PT Next Visit Plan N/A unless vertigo re-occurs and pt calls to schedule appt.   PT Home Exercise Plan N/A   Consulted and Agree with Plan of Care Family member/caregiver   Family Member Consulted husband Audrie Gallus - 2015/04/14 1903    Functional Assessment Tool Used clinical judgment - minimal c/o vertigo/light-headedness with supine to sit   Functional Limitation Other PT primary   Other PT Primary Current Status (Z6109) At least 1 percent but less than 20 percent impaired, limited or restricted   Other PT Primary Goal Status (U0454) 0 percent impaired, limited or restricted       Problem List Patient  Active Problem List   Diagnosis Date Noted  . Syncopal episodes 06/02/2013  . Hyperlipidemia 04/17/2011  . Routine general medical examination at a health care facility 04/17/2011  . Major depression in remission 05/23/2010  . ALZHEIMER'S DISEASE 05/23/2010  . OSTEOARTHRITIS, KNEE 05/23/2010  . OSTEOPOROSIS 12/17/2007    Kary Kos, PT 04-14-15, 7:09 PM  [facility] [address] [place], [place], [address] Phone: [phone]   Fax:  [phone]

## 2015-04-03 NOTE — Patient Instructions (Signed)
Benign Positional Vertigo Vertigo means you feel like you or your surroundings are moving when they are not. Benign positional vertigo is the most common form of vertigo. Benign means that the cause of your condition is not serious. Benign positional vertigo is more common in older adults. CAUSES  Benign positional vertigo is the result of an upset in the labyrinth system. This is an area in the middle ear that helps control your balance. This may be caused by a viral infection, head injury, or repetitive motion. However, often no specific cause is found. SYMPTOMS  Symptoms of benign positional vertigo occur when you move your head or eyes in different directions. Some of the symptoms may include:  Loss of balance and falls.  Vomiting.  Blurred vision.  Dizziness.  Nausea.  Involuntary eye movements (nystagmus). DIAGNOSIS  Benign positional vertigo is usually diagnosed by physical exam. If the specific cause of your benign positional vertigo is unknown, your caregiver may perform imaging tests, such as magnetic resonance imaging (MRI) or computed tomography (CT). TREATMENT  Your caregiver may recommend movements or procedures to correct the benign positional vertigo. Medicines such as meclizine, benzodiazepines, and medicines for nausea may be used to treat your symptoms. In rare cases, if your symptoms are caused by certain conditions that affect the inner ear, you may need surgery. HOME CARE INSTRUCTIONS   Follow your caregiver's instructions.  Move slowly. Do not make sudden body or head movements.  Avoid driving.  Avoid operating heavy machinery.  Avoid performing any tasks that would be dangerous to you or others during a vertigo episode.  Drink enough fluids to keep your urine clear or pale yellow. SEEK IMMEDIATE MEDICAL CARE IF:   You develop problems with walking, weakness, numbness, or using your arms, hands, or legs.  You have difficulty speaking.  You develop  severe headaches.  Your nausea or vomiting continues or gets worse.  You develop visual changes.  Your family or friends notice any behavioral changes.  Your condition gets worse.  You have a fever.  You develop a stiff neck or sensitivity to light. MAKE SURE YOU:   Understand these instructions.  Will watch your condition.  Will get help right away if you are not doing well or get worse. Document Released: 05/13/2006 Document Revised: 10/28/2011 Document Reviewed: 04/25/2011 ExitCare Patient Information 2015 [employer]. This information is not intended to replace advice given to you by your health care Jaliya Siegmann. Make sure you discuss any questions you have with your health care Reinhard Schack.    

## 2015-04-11 ENCOUNTER — Telehealth: Payer: Self-pay

## 2015-04-11 NOTE — Telephone Encounter (Signed)
Spoke to pt and asked her if it was ok to move her 04/13/15 3:00 appt to the same day, just earlier at 9:30. Pt was only given a 15 minute visit and she needs a 30 minute visit for memory testing. Pt was agreeable, and said she was writing it down right now. I advised pt that her new appt is 04/13/15 at 9:30 and to please arrive 15 minutes early. Pt verbalized understanding.

## 2015-04-13 ENCOUNTER — Ambulatory Visit: Payer: Medicare Other | Admitting: Neurology

## 2015-04-13 ENCOUNTER — Ambulatory Visit (INDEPENDENT_AMBULATORY_CARE_PROVIDER_SITE_OTHER): Payer: Medicare Other | Admitting: Neurology

## 2015-04-13 ENCOUNTER — Encounter: Payer: Self-pay | Admitting: Neurology

## 2015-04-13 VITALS — BP 100/60 | HR 76 | Resp 20 | Ht 63.0 in | Wt 128.0 lb

## 2015-04-13 DIAGNOSIS — F0280 Dementia in other diseases classified elsewhere without behavioral disturbance: Secondary | ICD-10-CM

## 2015-04-13 DIAGNOSIS — G309 Alzheimer's disease, unspecified: Secondary | ICD-10-CM | POA: Diagnosis not present

## 2015-04-13 NOTE — Patient Instructions (Addendum)
Donepezil tablets What is this medicine? DONEPEZIL (doe NEP e zil) is used to treat mild to moderate dementia caused by Alzheimer's disease. This medicine may be used for other purposes; ask your health care provider or pharmacist if you have questions. COMMON BRAND NAME(S): Aricept What should I tell my health care provider before I take this medicine? They need to know if you have any of these conditions: -asthma or other lung disease -difficulty passing urine -head injury -heart disease -history of irregular heartbeat -liver disease -seizures (convulsions) -stomach or intestinal disease, ulcers or stomach bleeding -an unusual or allergic reaction to donepezil, other medicines, foods, dyes, or preservatives -pregnant or trying to get pregnant -breast-feeding How should I use this medicine? Take this medicine by mouth with a glass of water. Follow the directions on the prescription label. You may take this medicine with or without food. Take this medicine at regular intervals. This medicine is usually taken before bedtime. Do not take it more often than directed. Continue to take your medicine even if you feel better. Do not stop taking except on your doctor's advice. If you are taking the 23 mg donepezil tablet, swallow it whole; do not cut, crush, or chew it. Talk to your pediatrician regarding the use of this medicine in children. Special care may be needed. Overdosage: If you think you have taken too much of this medicine contact a poison control center or emergency room at once. NOTE: This medicine is only for you. Do not share this medicine with others. What if I miss a dose? If you miss a dose, take it as soon as you can. If it is almost time for your next dose, take only that dose, do not take double or extra doses. What may interact with this medicine? Do not take this medicine with any of the following medications: -certain medicines for fungal infections like itraconazole,  fluconazole, posaconazole, and voriconazole -cisapride -dextromethorphan; quinidine -dofetilide -dronedarone -pimozide -quinidine -thioridazine -ziprasidone This medicine may also interact with the following medications: -antihistamines for allergy, cough and cold -atropine -bethanechol -carbamazepine -certain medicines for bladder problems like oxybutynin, tolterodine -certain medicines for Parkinson's disease like benztropine, trihexyphenidyl -certain medicines for stomach problems like dicyclomine, hyoscyamine -certain medicines for travel sickness like scopolamine -dexamethasone -ipratropium -NSAIDs, medicines for pain and inflammation, like ibuprofen or naproxen -other medicines for Alzheimer's disease -other medicines that prolong the QT interval (cause an abnormal heart rhythm) -phenobarbital -phenytoin -rifampin, rifabutin or rifapentine This list may not describe all possible interactions. Give your health care provider a list of all the medicines, herbs, non-prescription drugs, or dietary supplements you use. Also tell them if you smoke, drink alcohol, or use illegal drugs. Some items may interact with your medicine. What should I watch for while using this medicine? Visit your doctor or health care professional for regular checks on your progress. Check with your doctor or health care professional if your symptoms do not get better or if they get worse. You may get drowsy or dizzy. Do not drive, use machinery, or do anything that needs mental alertness until you know how this drug affects you. What side effects may I notice from receiving this medicine? Side effects that you should report to your doctor or health care professional as soon as possible: -allergic reactions like skin rash, itching or hives, swelling of the face, lips, or tongue -changes in vision -feeling faint or lightheaded, falls -problems with balance -slow heartbeat, or palpitations -stomach  pain -unusual bleeding or bruising,   red or purple spots on the skin -vomiting -weight loss Side effects that usually do not require medical attention (report to your doctor or health care professional if they continue or are bothersome): -diarrhea, especially when starting treatment -headache -indigestion or heartburn -loss of appetite -muscle cramps -nausea This list may not describe all possible side effects. Call your doctor for medical advice about side effects. You may report side effects to FDA at 1-800-FDA-1088. Where should I keep my medicine? Keep out of reach of children. Store at room temperature between 15 and 30 degrees C (59 and 86 degrees F). Throw away any unused medicine after the expiration date. NOTE: This sheet is a summary. It may not cover all possible information. If you have questions about this medicine, talk to your doctor, pharmacist, or health care provider.  2015, Elsevier/Gold Standard. (2013-11-22 21:44:11).     Rv in 6 month, should the MMSE score drop, I will use Aricept 23 mg.

## 2015-04-13 NOTE — Progress Notes (Signed)
[facility]  Provider:  Dr Brett Fairy Referring Provider: Olga Millers, MD Primary Care Physician:  Olga Millers, MD  Chief Complaint  Patient presents with  . Follow-up    memory, rm 11, alone    HPI:  Kim Hernandez is a 74 y.o. female here as a revisit  from Dr. Doug Sou for memory loss.    04-13-15 [name] is a right-handed, married Caucasian female patient presented in early 2009 for memory evaluations,  Her clinical course and her imaging studies are consistent with a diagnosis of Alzheimer's disease. The patient has always been aware of the diagnosis. She started on Aricept and reported no side effects from the medication. She reports no sundowning,  no change in weight.  no change in smell or taste,  has a good appetite and is socially active as well as physically active. [name] is able to orient herself in time frame throughout the day, knows when its morshe knows the date, but today stated it is Mob nday, when it was actually Friday.    She remains completely independent in her daily life with all activities of daily living. She did get lost twice  and therefore stopped driving in 5003 .  In May 2012 the patient underwent a PET scan which showed the evidence the distribution of angles indicative of Alzheimer's her short-term memory is still the only deficits sometime she had trouble naming people or misplacing things she scored 28/30 on a Mini-Mental test in November 2012 her Mini-Mental Status Examination was 25 points and her animal fluency test 21 points In August  2014 her Mini-Mental Status Examination was 22 and animal fluency test 20 be also added a MOCA , in which  she did very well- with the trail making and visual spatial demands of that test, and  to 24 words.  She was quite frustrated with a five-minute recall failure.   "I am still playing Scrabble but I just don't win anymore " - Today's geriatric depression score was endorsed  at 4 points, and the patient's Mini-Mental Status Examination revealed 19 out of 30 points. Her main problem is the immediate recall of 3 words and she also had some trouble counting backwards by sevens. No is a disorientation to season year day of the week. She was aware of the month of August.    STUDY DATE: 10/26/14 PATIENT NAME: Kim Hernandez DOB: Jan 12, 1941 MRN: [id number]  ORDERING CLINICIAN: Larey Seat, MD  CLINICAL HISTORY: 74 year old female with memory loss, syncope and dizziness.  EXAM: MRI brain (with and without)  TECHNIQUE: MRI of the brain with and without contrast was obtained utilizing 5 mm axial slices with T1, T2, T2 flair, SWI and diffusion weighted views. T1 sagittal, T2 coronal and postcontrast views in the axial and coronal plane were obtained. CONTRAST: 17ml multihance  IMAGING SITE: [facility] [address] (1.5 Tesla MRI)   FINDINGS:  No abnormal lesions are seen on diffusion-weighted views to suggest acute ischemia. The cortical sulci, fissures and cisterns are notable for moderate diffuse and severe left mesial temporal atrophy. Lateral, third and fourth ventricle are moderately enlarged on ex vacuo basis. No extra-axial fluid collections are seen. No evidence of mass effect or midline shift.   Moderate periventricular and subcortical and pontine chronic small vessel ischemic disease.  No abnormal lesions are seen on post contrast views.   On sagittal views the posterior fossa, pituitary gland and corpus callosum are unremarkable. No  evidence of intracranial hemorrhage on SWI views. The orbits and their contents, paranasal sinuses and calvarium are unremarkable. Intracranial flow voids are present.   IMPRESSION:  Abnormal MRI brain (with and without) demonstrating: 1. Moderate diffuse and severe left mesial temporal atrophy. Moderate ventriculomegaly on ex vacuo basis. 2. Moderate periventricular and subcortical and pontine chronic  small vessel ischemic disease. 3. No acute findings.    INTERPRETING PHYSICIAN:  Penni Bombard, MD Certified in Neurology, Neurophysiology and Neuroimaging  [facility] [address], [place], [place] [address] [phone]      Other Scans     Scan on 10/24/2014 2:35 PM by Erline Levine : BLUE MEDICARE MRI PT ESTIMATEScan on 10/24/2014 2:35 PM by Erline Levine : BLUE MEDICARE MRI PT ESTIMATE     Scan on 10/24/2014 2:35 PM by Erline Levine : BLUE MEDICARE MRI [place] on 10/24/2014 2:35 PM by Erline Levine : BLUE MEDICARE MRI AUTHORIZATION     Result Notes     Notes Recorded by Larey Seat, MD on 11/07/2014 at 9:11 AM Brain atrophy in the area of memory centers, the mesio-temporal lobes. Patient already received this message in a personal phone call. CD          Vitals     Height Weight BMI (Calculated)    '5\' 2"'$  (1.575 m) 130 lb (58.968 kg) 23.8      Interpretation Summary     [facility]  NEUROIMAGING REPORT   STUDY DATE: 10/26/14 PATIENT NAME: Kim Hernandez DOB: 04-Sep-1940 MRN: [id number]  ORDERING CLINICIAN: Larey Seat, MD  CLINICAL HISTORY: 74 year old female with memory loss, syncope and dizziness.  EXAM: MRI brain (with and without)  TECHNIQUE: MRI of the brain with and without contrast was obtained utilizing 5 mm axial slices with T1, T2, T2 flair, SWI and diffusion weighted views. T1 sagittal, T2 coronal and postcontrast views in the axial and coronal plane were obtained. CONTRAST: 36ml multihance  IMAGING SITE: [facility] [address] (1.5 Tesla MRI)   FINDINGS:  No abnormal lesions are seen on diffusion-weighted views to suggest acute ischemia. The cortical sulci, fissures and cisterns are notable for moderate diffuse and severe left mesial temporal atrophy. Lateral, third and fourth ventricle are moderately enlarged on ex vacuo basis. No  extra-axial fluid collections are seen. No evidence of mass effect or midline shift.   Moderate periventricular and subcortical and pontine chronic small vessel ischemic disease.  No abnormal lesions are seen on post contrast views.   On sagittal views the posterior fossa, pituitary gland and corpus callosum are unremarkable. No evidence of intracranial hemorrhage on SWI views. The orbits and their contents, paranasal sinuses and calvarium are unremarkable. Intracranial flow voids are present.   IMPRESSION:  Abnormal MRI brain (with and without) demonstrating: 1. Moderate diffuse and severe left mesial temporal atrophy. Moderate ventriculomegaly on ex vacuo basis. 2. Moderate periventricular and subcortical and pontine chronic small vessel ischemic disease. 3. No acute findings.    INTERPRETING PHYSICIAN:  Penni Bombard, MD Certified in Neurology, Neurophysiology and Neuroimaging        Review of Systems: Out of a complete 14 system review, the patient complains of only the following symptoms, and all other reviewed systems are negative.  "Getting forgetful" sense of doom, anxiety , depression.   Stopped Celexa,  Restarted in February 2016 after feeling moe blue.  She still has the medication at home.   Mrs Broden married at age 9 ,  met her husband at age 65. She delivered 2 children, at age 26 and age 86.   Social History   Social History  . Marital Status: Married    Spouse Name: N/A  . Number of Children: 2  . Years of Education: N/A   Occupational History  . retired     worked at [employer] and [employer]   Social History Main Topics  . Smoking status: Never Smoker   . Smokeless tobacco: Never Used  . Alcohol Use: No     Comment: quit- had a problem w/ alcohol and has abstained for years  . Drug Use: No  . Sexual Activity: Not on file   Other Topics Concern  . Not on file   Social History Narrative   Uses seat belt regularly    Family History   Problem Relation Age of Onset  . Anemia Mother   . Alzheimer's disease Mother   . Emphysema Mother     smoker  . Obesity Sister   . Emphysema Sister     smoker  . Coronary artery disease Brother     smoker  . Heart attack Brother   . Obesity Daughter   . Depression Daughter   . Alcohol abuse Son   . Depression Son   . Suicidality Son     10/12-commited suicide in a manic depressive episode    Past Medical History  Diagnosis Date  . Hyperlipidemia   . Depression     monopolar  . Alcohol abuse   . Arthritis   . Kidney stone   . Migraine     pt denies  . OSTEOPOROSIS 12/17/2007  . OSTEOARTHRITIS, KNEE 05/23/2010  . GERD 05/11/2007    ?  Marland Kitchen DEMENTIA 07/20/2008  . ANXIETY DEPRESSION 05/23/2010  . Alzheimer's disease 05/23/2010    ?  Marland Kitchen Wrist pain, right 04/17/2011  . Memory loss   . Osteopenia     Past Surgical History  Procedure Laterality Date  . Total knee arthroplasty Right   . Tonsillectomy    . Dilation and curettage of uterus      forty years ago    Current Outpatient Prescriptions  Medication Sig Dispense Refill  . aspirin 325 MG tablet Take 162.5 mg by mouth daily.    . Cholecalciferol (VITAMIN D3) 1000 UNITS CAPS Take 1 capsule by mouth daily.      . citalopram (CELEXA) 20 MG tablet Take 1 tablet (20 mg total) by mouth daily. 100 tablet 3  . donepezil (ARICEPT) 10 MG tablet Take 1 tablet (10 mg total) by mouth daily. 100 tablet 3  . memantine (NAMENDA XR) 28 MG CP24 24 hr capsule Take 1 capsule (28 mg total) by mouth daily. 100 capsule 3  . Omega-3 Fatty Acids (FISH OIL) 1200 MG CAPS Take 1 capsule by mouth daily.     No current facility-administered medications for this visit.    Allergies as of 04/13/2015 - Review Complete 04/13/2015  Allergen Reaction Noted  . Poison ivy extract [extract of poison ivy]  03/03/2013    Vitals: BP 100/60 mmHg  Pulse 76  Resp 20  Ht $R'5\' 3"'zY$  (1.6 m)  Wt 128 lb (58.06 kg)  BMI 22.68 kg/m2 Last Weight:  Wt Readings  from Last 1 Encounters:  04/13/15 128 lb (58.06 kg)   Last Height:   Ht Readings from Last 1 Encounters:  04/13/15 $RemoveB'5\' 3"'eSfmjYlc$  (1.6 m)     Physical exam:  General: The patient is awake,  alert and appears not in acute distress. The patient is well groomed. Head: Normocephalic, atraumatic. Neck is supple. Mallampati 2 , neck circumference: 13.5 ,  Cardiovascular:  Regular rate and rhythm, without  murmurs or carotid bruit, and without distended neck veins. Respiratory: Lungs are clear to auscultation. Skin:  Without evidence of edema, or rash Trunk: BMI is normal posture.  Neurologic exam : The patient is awake and alert, oriented to place and time.   Memory subjective described as impaired , MMSE .  There is a normal attention span & concentration ability.  Speech is fluent without  dysarthria, dysphonia - occasional  Aphasia.  Mood and affect are appropriate. She is reflecting on her "sense of doom "spells.   Cranial nerves: Pupils are equal and briskly reactive to light. Funduscopic exam without  evidence of pallor or edema.  Extraocular movements  in vertical and horizontal planes intact and without nystagmus. Visual fields by finger perimetry are intact. Hearing to finger rub intact. Facial sensation intact to fine touch. Facial motor strength is symmetric and tongue and uvula move midline.  Motor exam:   Normal tone and normal muscle bulk and symmetric normal strength in all extremities.  No parkinsonism.    Sensory:  Fine touch, pinprick and vibration were tested in all extremities. Proprioception is normal.  Coordination: Rapid alternating movements in the fingers/hands is tested and normal.  Finger-to-nose maneuver  With new evidence of ataxia, dysmetria and  Tremor on the left side. Mild pronator drift .  Gait and station: Patient walks without assistive device and is able to climb up to the exam table.  Not bracing herself - rises easily, Strength within normal limits.  Stance is stable and normal. Steps are unfragmented. Romberg testing is normal.  Deep tendon reflexes: in the  upper and lower extremities are symmetric and intact. Babinski maneuver unable to test,  she is extremely ticklish !  Assessment:  After physical and neurologic examination, review of laboratory studies, imaging, neurophysiology testing and pre-existing records, assessment will be reviewed on the problem list.   30 minute visit with more than 50% of the face to face time dedicated to discussion of home health needs.  Respite care options for the main caretaker and prognosis.   Able to hold her cognitive examination results/ scores between 19 and to 28 Points on Mini-Mental test over the last 3-1/2 years, a slow progression.   The patient has introduced herself to Dr. Jake Bathe a new primary care physician that has taken over some of Dr. Charlesetta Ivory former patients. Her introductory visit went well.avs Her husband and the patient both seem to deal well with the diagnosis of Alzheimer's disease and the knowledge of a primary degenerative disorder. Those are villages and have found comfort in their face. They also feel that if anxiety or tension strikes their able to ameliorate the differences by prayer or meditation or reading scriptures rather than requiring an antidepressive and periods her husband also doesn't feel that she is confused or at baseline changed very much and her personality has not changed with the disease. For that reason I have not increased the Aricept from 10 mg to 23 mg as I feel that the needs to be a stronger change before we would do something in that regard.  Her depression score today is under the level , considered a clinical significant limits. I would like to her for her to follow-up every 6 months.  Plan:  Treatment plan and additional workup will  be reviewed under Problem List.     Follow up every 6 month with me , 30 minutes, MMSE and MOCA.

## 2015-06-09 ENCOUNTER — Ambulatory Visit (INDEPENDENT_AMBULATORY_CARE_PROVIDER_SITE_OTHER): Payer: Medicare Other | Admitting: *Deleted

## 2015-06-09 DIAGNOSIS — Z23 Encounter for immunization: Secondary | ICD-10-CM | POA: Diagnosis not present

## 2015-09-06 ENCOUNTER — Telehealth: Payer: Self-pay | Admitting: Geriatric Medicine

## 2015-09-06 ENCOUNTER — Telehealth: Payer: Self-pay | Admitting: Neurology

## 2015-09-06 NOTE — Telephone Encounter (Signed)
Ins has been contacted and provided with clinical info.  Request is under review Ref # [id number]  [employer] has approved the request for coverage on Namenda effective until 09/05/2016.  They indicated the patient has been notified via outbound call.  As well, we contacted the patient.

## 2015-09-06 NOTE — Telephone Encounter (Signed)
Pt's husband called said Dr Shela Commons. Kim Hernandez has retired and Dr Haroldine Laws has taken his pts. Pt needs a refill for memantine (NAMENDA XR) 28 MG CP24 24 hr capsule and Dr Haroldine Laws said to get all memory meds from Dr Vickey Huger. Husband said memantine (NAMENDA XR) 28 MG CP24 24 hr capsule has been denied from [facility] [phone]. He called [employer] and was told to have provider to call and ask for a formulary exception. Can send e-mail to let him know status

## 2015-09-06 NOTE — Telephone Encounter (Signed)
Patient's husband called. Patient is on Namenda. It is no longer in the formulary. Please request a formulary exception with [facility] [phone]. Patient is seen by neurology for her dementia. He is going to call neurology. If they can't help him, he will call me back.

## 2015-09-08 ENCOUNTER — Encounter: Payer: Self-pay | Admitting: Internal Medicine

## 2015-09-08 ENCOUNTER — Other Ambulatory Visit: Payer: Self-pay

## 2015-09-08 MED ORDER — MEMANTINE HCL ER 28 MG PO CP24: 28.0000 mg | ORAL_CAPSULE | Freq: Every day | ORAL | Status: DC

## 2015-09-08 NOTE — Telephone Encounter (Signed)
Patient indicated PCP would like [place] to take over this Rx.  Please advise.  Thank you.

## 2015-09-15 ENCOUNTER — Other Ambulatory Visit (INDEPENDENT_AMBULATORY_CARE_PROVIDER_SITE_OTHER): Payer: Medicare Other

## 2015-09-15 ENCOUNTER — Encounter: Payer: Self-pay | Admitting: Neurology

## 2015-09-15 ENCOUNTER — Encounter: Payer: Self-pay | Admitting: Internal Medicine

## 2015-09-15 ENCOUNTER — Ambulatory Visit (INDEPENDENT_AMBULATORY_CARE_PROVIDER_SITE_OTHER): Payer: Medicare Other | Admitting: Internal Medicine

## 2015-09-15 VITALS — BP 138/62 | HR 59 | Temp 97.9°F | Resp 12 | Ht 62.0 in | Wt 130.0 lb

## 2015-09-15 DIAGNOSIS — G308 Other Alzheimer's disease: Secondary | ICD-10-CM

## 2015-09-15 DIAGNOSIS — Z0000 Encounter for general adult medical examination without abnormal findings: Secondary | ICD-10-CM | POA: Diagnosis not present

## 2015-09-15 DIAGNOSIS — F0280 Dementia in other diseases classified elsewhere without behavioral disturbance: Secondary | ICD-10-CM

## 2015-09-15 DIAGNOSIS — M810 Age-related osteoporosis without current pathological fracture: Secondary | ICD-10-CM

## 2015-09-15 LAB — COMPREHENSIVE METABOLIC PANEL
ALT: 11 U/L (ref 0–35)
AST: 17 U/L (ref 0–37)
Albumin: 4 g/dL (ref 3.5–5.2)
Alkaline Phosphatase: 86 U/L (ref 39–117)
BUN: 22 mg/dL (ref 6–23)
CHLORIDE: 103 meq/L (ref 96–112)
CO2: 29 meq/L (ref 19–32)
CREATININE: 0.75 mg/dL (ref 0.40–1.20)
Calcium: 9.2 mg/dL (ref 8.4–10.5)
GFR: 79.98 mL/min (ref >60.00)
GLUCOSE: 93 mg/dL (ref 70–99)
Potassium: 4.2 mEq/L (ref 3.5–5.1)
SODIUM: 138 meq/L (ref 135–145)
Total Bilirubin: 0.5 mg/dL (ref 0.2–1.2)
Total Protein: 6.9 g/dL (ref 6.0–8.3)

## 2015-09-15 LAB — LIPID PANEL
CHOL/HDL RATIO: 4
Cholesterol: 329 mg/dL — ABNORMAL HIGH (ref 0–200)
HDL: 75.6 mg/dL (ref >39.00)
LDL CALC: 239 mg/dL — AB (ref 0–99)
NonHDL: 253.1
Triglycerides: 69 mg/dL (ref 0.0–149.0)
VLDL: 13.8 mg/dL (ref 0.0–40.0)

## 2015-09-15 LAB — CBC
HCT: 43 % (ref 36.0–46.0)
Hemoglobin: 14.3 g/dL (ref 12.0–15.0)
MCHC: 33.3 g/dL (ref 30.0–36.0)
MCV: 94.8 fl (ref 78.0–100.0)
Platelets: 243 10*3/uL (ref 150.0–400.0)
RBC: 4.54 Mil/uL (ref 3.87–5.11)
RDW: 12.6 % (ref 11.5–15.5)
WBC: 5.6 10*3/uL (ref 4.0–10.5)

## 2015-09-15 NOTE — Assessment & Plan Note (Signed)
Taking namenda and aricept without side effects. Some progression from last year per her husband but no behavioral changes.

## 2015-09-15 NOTE — Progress Notes (Signed)
   Subjective:    Patient ID: Kim Hernandez, female    DOB: June 18, 1941, 75 y.o.   MRN: [id number]  HPI Here for medicare wellness, no new complaints. Please see A/P for status and treatment of chronic medical problems.   Diet: heart healthy Physical activity: sedentary Depression/mood screen: negative Hearing: intact to whispered voice Visual acuity: grossly normal, performs annual eye exam  ADLs: capable Fall risk: none Home safety: good Cognitive evaluation: intact to orientation, naming, recall and repetition EOL planning: adv directives discussed  I have personally reviewed and have noted 1. The patient's medical and social history - reviewed today no changes 2. Their use of alcohol, tobacco or illicit drugs 3. Their current medications and supplements 4. The patient's functional ability including ADL's, fall risks, home safety risks and hearing or visual impairment. 5. Diet and physical activities 6. Evidence for depression or mood disorders 7. Care team reviewed and updated (available in snapshot)  Review of Systems  Constitutional: Negative.   HENT: Negative.   Eyes: Negative.   Respiratory: Negative.   Cardiovascular: Negative.   Gastrointestinal: Negative.   Musculoskeletal: Negative.   Skin: Negative.   Neurological: Negative for dizziness, syncope, speech difficulty, weakness, light-headedness and headaches.       Memory change  Psychiatric/Behavioral: Negative.       Objective:   Physical Exam  Constitutional: She is oriented to person, place, and time. She appears well-developed and well-nourished.  HENT:  Head: Normocephalic and atraumatic.  Eyes: EOM are normal.  Neck: Normal range of motion.  Cardiovascular: Normal rate and regular rhythm.   Pulmonary/Chest: Effort normal and breath sounds normal.  Abdominal: Soft. She exhibits no distension. There is no tenderness.  Musculoskeletal: She exhibits no edema.  Neurological: She is alert and oriented  to person, place, and time. Coordination normal.  Skin: Skin is warm and dry.   Filed Vitals:   09/15/15 0829  BP: 138/62  Pulse: 59  Temp: 97.9 F (36.6 C)  TempSrc: Oral  Resp: 12  Height:  (1.575 m)  Weight: 130 lb (58.968 kg)  SpO2: 96%      Assessment & Plan:

## 2015-09-15 NOTE — Patient Instructions (Signed)
We will check the blood work today and call you back with the results.   We can see you back in about a year and if you are having problems or questions please feel free to call us or come back sooner.  Health Maintenance, Female Adopting a healthy lifestyle and getting preventive care can go a long way to promote health and wellness. Talk with your health care provider about what schedule of regular examinations is right for you. This is a good chance for you to check in with your provider about disease prevention and staying healthy. In between checkups, there are plenty of things you can do on your own. Experts have done a lot of research about which lifestyle changes and preventive measures are most likely to keep you healthy. Ask your health care provider for more information. WEIGHT AND DIET  Eat a healthy diet  Be sure to include plenty of vegetables, fruits, low-fat dairy products, and lean protein.  Do not eat a lot of foods high in solid fats, added sugars, or salt.  Get regular exercise. This is one of the most important things you can do for your health.  Most adults should exercise for at least 150 minutes each week. The exercise should increase your heart rate and make you sweat (moderate-intensity exercise).  Most adults should also do strengthening exercises at least twice a week. This is in addition to the moderate-intensity exercise.  Maintain a healthy weight  Body mass index (BMI) is a measurement that can be used to identify possible weight problems. It estimates body fat based on height and weight. Your health care provider can help determine your BMI and help you achieve or maintain a healthy weight.  For females 76 years of age and older:   A BMI below 18.5 is considered underweight.  A BMI of 18.5 to 24.9 is normal.  A BMI of 25 to 29.9 is considered overweight.  A BMI of 30 and above is considered obese.  Watch levels of cholesterol and blood lipids  You  should start having your blood tested for lipids and cholesterol at 76 years of age, then have this test every 5 years.  You may need to have your cholesterol levels checked more often if:  Your lipid or cholesterol levels are high.  You are older than 76 years of age.  You are at high risk for heart disease.  CANCER SCREENING   Lung Cancer  Lung cancer screening is recommended for adults 76-13 years old who are at high risk for lung cancer because of a history of smoking.  A yearly low-dose CT scan of the lungs is recommended for people who:  Currently smoke.  Have quit within the past 15 years.  Have at least a 30-pack-year history of smoking. A pack year is smoking an average of one pack of cigarettes a day for 1 year.  Yearly screening should continue until it has been 15 years since you quit.  Yearly screening should stop if you develop a health problem that would prevent you from having lung cancer treatment.  Breast Cancer  Practice breast self-awareness. This means understanding how your breasts normally appear and feel.  It also means doing regular breast self-exams. Let your health care provider know about any changes, no matter how small.  If you are in your 20s or 30s, you should have a clinical breast exam (CBE) by a health care provider every 1-3 years as part of a regular health  exam.  If you are 40 or older, have a CBE every year. Also consider having a breast X-ray (mammogram) every year.  If you have a family history of breast cancer, talk to your health care provider about genetic screening.  If you are at high risk for breast cancer, talk to your health care provider about having an MRI and a mammogram every year.  Breast cancer gene (BRCA) assessment is recommended for women who have family members with BRCA-related cancers. BRCA-related cancers include:  Breast.  Ovarian.  Tubal.  Peritoneal cancers.  Results of the assessment will determine  the need for genetic counseling and BRCA1 and BRCA2 testing. Cervical Cancer Your health care provider may recommend that you be screened regularly for cancer of the pelvic organs (ovaries, uterus, and vagina). This screening involves a pelvic examination, including checking for microscopic changes to the surface of your cervix (Pap test). You may be encouraged to have this screening done every 3 years, beginning at age 52.  For women ages 25-65, health care providers may recommend pelvic exams and Pap testing every 3 years, or they may recommend the Pap and pelvic exam, combined with testing for human papilloma virus (HPV), every 5 years. Some types of HPV increase your risk of cervical cancer. Testing for HPV may also be done on women of any age with unclear Pap test results.  Other health care providers may not recommend any screening for nonpregnant women who are considered low risk for pelvic cancer and who do not have symptoms. Ask your health care provider if a screening pelvic exam is right for you.  If you have had past treatment for cervical cancer or a condition that could lead to cancer, you need Pap tests and screening for cancer for at least 20 years after your treatment. If Pap tests have been discontinued, your risk factors (such as having a new sexual partner) need to be reassessed to determine if screening should resume. Some women have medical problems that increase the chance of getting cervical cancer. In these cases, your health care provider may recommend more frequent screening and Pap tests. Colorectal Cancer  This type of cancer can be detected and often prevented.  Routine colorectal cancer screening usually begins at 76 years of age and continues through 76 years of age.  Your health care provider may recommend screening at an earlier age if you have risk factors for colon cancer.  Your health care provider may also recommend using home test kits to check for hidden blood  in the stool.  A small camera at the end of a tube can be used to examine your colon directly (sigmoidoscopy or colonoscopy). This is done to check for the earliest forms of colorectal cancer.  Routine screening usually begins at age 76.  Direct examination of the colon should be repeated every 5-10 years through 76 years of age. However, you may need to be screened more often if early forms of precancerous polyps or small growths are found. Skin Cancer  Check your skin from head to toe regularly.  Tell your health care provider about any new moles or changes in moles, especially if there is a change in a mole's shape or color.  Also tell your health care provider if you have a mole that is larger than the size of a pencil eraser.  Always use sunscreen. Apply sunscreen liberally and repeatedly throughout the day.  Protect yourself by wearing long sleeves, pants, a wide-brimmed hat, and sunglasses  whenever you are outside. HEART DISEASE, DIABETES, AND HIGH BLOOD PRESSURE   High blood pressure causes heart disease and increases the risk of stroke. High blood pressure is more likely to develop in:  People who have blood pressure in the high end of the normal range (130-139/85-89 mm Hg).  People who are overweight or obese.  People who are African American.  If you are 58-2 years of age, have your blood pressure checked every 3-5 years. If you are 80 years of age or older, have your blood pressure checked every year. You should have your blood pressure measured twice--once when you are at a hospital or clinic, and once when you are not at a hospital or clinic. Record the average of the two measurements. To check your blood pressure when you are not at a hospital or clinic, you can use:  An automated blood pressure machine at a pharmacy.  A home blood pressure monitor.  If you are between 55 years and 55 years old, ask your health care provider if you should take aspirin to prevent  strokes.  Have regular diabetes screenings. This involves taking a blood sample to check your fasting blood sugar level.  If you are at a normal weight and have a low risk for diabetes, have this test once every three years after 76 years of age.  If you are overweight and have a high risk for diabetes, consider being tested at a younger age or more often. PREVENTING INFECTION  Hepatitis B  If you have a higher risk for hepatitis B, you should be screened for this virus. You are considered at high risk for hepatitis B if:  You were born in a country where hepatitis B is common. Ask your health care provider which countries are considered high risk.  Your parents were born in a high-risk country, and you have not been immunized against hepatitis B (hepatitis B vaccine).  You have HIV or AIDS.  You use needles to inject street drugs.  You live with someone who has hepatitis B.  You have had sex with someone who has hepatitis B.  You get hemodialysis treatment.  You take certain medicines for conditions, including cancer, organ transplantation, and autoimmune conditions. Hepatitis C  Blood testing is recommended for:  Everyone born from [birth year] through 1966.  Anyone with known risk factors for hepatitis C. Sexually transmitted infections (STIs)  You should be screened for sexually transmitted infections (STIs) including gonorrhea and chlamydia if:  You are sexually active and are younger than 75 years of age.  You are older than 76 years of age and your health care provider tells you that you are at risk for this type of infection.  Your sexual activity has changed since you were last screened and you are at an increased risk for chlamydia or gonorrhea. Ask your health care provider if you are at risk.  If you do not have HIV, but are at risk, it may be recommended that you take a prescription medicine daily to prevent HIV infection. This is called pre-exposure prophylaxis  (PrEP). You are considered at risk if:  You are sexually active and do not regularly use condoms or know the HIV status of your partner(s).  You take drugs by injection.  You are sexually active with a partner who has HIV. Talk with your health care provider about whether you are at high risk of being infected with HIV. If you choose to begin PrEP, you should first be  tested for HIV. You should then be tested every 3 months for as long as you are taking PrEP.  PREGNANCY   If you are premenopausal and you may become pregnant, ask your health care provider about preconception counseling.  If you may become pregnant, take 400 to 800 micrograms (mcg) of folic acid every day.  If you want to prevent pregnancy, talk to your health care provider about birth control (contraception). OSTEOPOROSIS AND MENOPAUSE   Osteoporosis is a disease in which the bones lose minerals and strength with aging. This can result in serious bone fractures. Your risk for osteoporosis can be identified using a bone density scan.  If you are 37 years of age or older, or if you are at risk for osteoporosis and fractures, ask your health care provider if you should be screened.  Ask your health care provider whether you should take a calcium or vitamin D supplement to lower your risk for osteoporosis.  Menopause may have certain physical symptoms and risks.  Hormone replacement therapy may reduce some of these symptoms and risks. Talk to your health care provider about whether hormone replacement therapy is right for you.  HOME CARE INSTRUCTIONS   Schedule regular health, dental, and eye exams.  Stay current with your immunizations.   Do not use any tobacco products including cigarettes, chewing tobacco, or electronic cigarettes.  If you are pregnant, do not drink alcohol.  If you are breastfeeding, limit how much and how often you drink alcohol.  Limit alcohol intake to no more than 1 drink per day for  nonpregnant women. One drink equals 12 ounces of beer, 5 ounces of wine, or 1 ounces of hard liquor.  Do not use street drugs.  Do not share needles.  Ask your health care provider for help if you need support or information about quitting drugs.  Tell your health care provider if you often feel depressed.  Tell your health care provider if you have ever been abused or do not feel safe at home.   This information is not intended to replace advice given to you by your health care provider. Make sure you discuss any questions you have with your health care provider.   Document Released: 02/18/2011 Document Revised: 08/26/2014 Document Reviewed: 07/07/2013 Elsevier Interactive Patient Education [employer].

## 2015-09-15 NOTE — Assessment & Plan Note (Signed)
Immunizations up to date. Checking labs today. Is active and non-smoker. Counseled about memory loss and progression. Given 10 year screening recommendations.

## 2015-09-15 NOTE — Assessment & Plan Note (Signed)
Has had course of fosamax in the past. Does not need repeat bone density at this time.

## 2015-09-15 NOTE — Progress Notes (Signed)
Pre visit review using our clinic review tool, if applicable. No additional management support is needed unless otherwise documented below in the visit note. 

## 2015-09-29 ENCOUNTER — Other Ambulatory Visit: Payer: Self-pay | Admitting: Family Medicine

## 2015-09-30 ENCOUNTER — Encounter: Payer: Self-pay | Admitting: Internal Medicine

## 2015-10-02 ENCOUNTER — Telehealth: Payer: Self-pay | Admitting: Neurology

## 2015-10-02 ENCOUNTER — Other Ambulatory Visit: Payer: Self-pay | Admitting: Neurology

## 2015-10-02 MED ORDER — ATORVASTATIN CALCIUM 40 MG PO TABS: 40.0000 mg | ORAL_TABLET | Freq: Every day | ORAL | Status: DC

## 2015-10-02 NOTE — Telephone Encounter (Signed)
Pt's husband called and says that pts PCP has retired and cannot get memantine (NAMENDA XR) 28 MG CP24 24 hr capsule. Pt's husband is requesting a refill be sent to [facility] on [address] and [address]. [place]. Thank you. May call pt at [phone]

## 2015-10-02 NOTE — Telephone Encounter (Signed)
This encounter was already closed when it was forwarded.  There was a separate encounter already in process regarding Rx, which was sent to provider for review.

## 2015-10-02 NOTE — Telephone Encounter (Signed)
PCP retired, patient requests we take over this Rx.  Thank you.

## 2015-10-11 ENCOUNTER — Encounter: Payer: Self-pay | Admitting: Neurology

## 2015-10-18 ENCOUNTER — Ambulatory Visit: Payer: Medicare Other | Admitting: Neurology

## 2015-10-29 ENCOUNTER — Encounter: Payer: Self-pay | Admitting: Internal Medicine

## 2015-10-29 DIAGNOSIS — F341 Dysthymic disorder: Secondary | ICD-10-CM

## 2015-10-30 MED ORDER — CITALOPRAM HYDROBROMIDE 20 MG PO TABS: 20.0000 mg | ORAL_TABLET | Freq: Every day | ORAL | Status: DC

## 2015-11-21 ENCOUNTER — Encounter: Payer: Self-pay | Admitting: Neurology

## 2015-12-01 ENCOUNTER — Other Ambulatory Visit: Payer: Self-pay | Admitting: Internal Medicine

## 2015-12-06 DIAGNOSIS — E7800 Pure hypercholesterolemia, unspecified: Secondary | ICD-10-CM | POA: Diagnosis not present

## 2015-12-06 DIAGNOSIS — F0390 Unspecified dementia without behavioral disturbance: Secondary | ICD-10-CM | POA: Diagnosis not present

## 2015-12-06 DIAGNOSIS — Z6823 Body mass index (BMI) 23.0-23.9, adult: Secondary | ICD-10-CM | POA: Diagnosis not present

## 2015-12-06 DIAGNOSIS — F321 Major depressive disorder, single episode, moderate: Secondary | ICD-10-CM | POA: Diagnosis not present

## 2016-01-05 ENCOUNTER — Other Ambulatory Visit: Payer: Self-pay | Admitting: Internal Medicine

## 2016-02-12 ENCOUNTER — Other Ambulatory Visit: Payer: Self-pay | Admitting: Internal Medicine

## 2016-03-27 ENCOUNTER — Ambulatory Visit (INDEPENDENT_AMBULATORY_CARE_PROVIDER_SITE_OTHER): Payer: Medicare Other | Admitting: Neurology

## 2016-03-27 ENCOUNTER — Encounter: Payer: Self-pay | Admitting: Neurology

## 2016-03-27 VITALS — BP 98/58 | HR 84 | Resp 20 | Ht 63.0 in | Wt 129.0 lb

## 2016-03-27 DIAGNOSIS — G301 Alzheimer's disease with late onset: Secondary | ICD-10-CM | POA: Diagnosis not present

## 2016-03-27 DIAGNOSIS — F0280 Dementia in other diseases classified elsewhere without behavioral disturbance: Secondary | ICD-10-CM | POA: Diagnosis not present

## 2016-03-27 MED ORDER — DONEPEZIL HCL 23 MG PO TABS: 23.0000 mg | ORAL_TABLET | Freq: Every day | ORAL | 2 refills | Status: DC

## 2016-03-27 NOTE — Progress Notes (Signed)
[facility]  Provider:  Dr Brett Fairy Referring Provider: Hoyt Koch, * Primary Care Physician:  [name], MD  Chief Complaint  Patient presents with  . Follow-up    memory    HPI:  Kim Hernandez is a 75 y.o. female here as a revisit  from Dr. Sharlet Salina for memory loss.    04-13-15 Kim Hernandez is a right-handed, married Caucasian female patient presented in early 2009 for memory evaluations,  Her clinical course and her imaging studies are consistent with a diagnosis of Alzheimer's disease. The patient has always been aware of the diagnosis. She started on Aricept and reported no side effects from the medication. She reports no sundowning,  no change in weight.  no change in smell or taste,  has a good appetite and is socially active as well as physically active. Kim Hernandez is able to orient herself in time frame throughout the day, knows when its morshe knows the date, but today stated it is Mob nday, when it was actually Friday.    She remains completely independent in her daily life with all activities of daily living. She did get lost twice  and therefore stopped driving in 4098 .  In May 2012  28/30 the patient underwent a PET scan which showed the evidence the distribution of angles indicative of Alzheimer's -her short-term memory is still the only deficit. sometime she had trouble naming people or misplacing things,on a Mini-Mental test in November 2012 her Mini-Mental Status Examination was 25 points and her animal fluency test 21 points In August  2014 her Mini-Mental Status Examination was 22 and animal fluency test 20 be also added a MOCA , in which  she did very well- with the trail making and visual spatial demands of that test, and  to 24 words.  She was quite frustrated with a five-minute recall failure.   "I am still playing Scrabble but I just don't win anymore " - Today's geriatric depression score was endorsed at 4 points, and the  patient's Mini-Mental Status Examination revealed 19 out of 30 points. Her main problem is the immediate recall of 3 words and she also had some trouble counting backwards by sevens. No is a disorientation to season year day of the week. She was aware of the month of August.  Interval history from 03/27/2016, The patient had a very slow progression of memory loss and actually score today 21 points. In comparison to November 2012 her Mini-Mental status result was 25 points.  The patient has been followed for memory loss since 2009 at a very slow progression. The couple will be married 37 year on May 1st 2018.    MMSE - Mini Mental State Exam 03/27/2016 03/27/2016 04/13/2015  Orientation to time 2 - 1  Orientation to Place 3 - 4  Registration _0 Attention/ Calculation _1 Recall 0 0 0  Language- name 2 objects _2 Language- repeat _3 Language- follow 3 step command _4 Language- read & follow direction _5 Write a sentence _6 Copy design 0 0 1  Total score 21 - 19        STUDY DATE: 10/26/14 PATIENT NAME: Kim Hernandez DOB: May 06, 1941 MRN: [id number]  ORDERING CLINICIAN: Larey Seat, MD  CLINICAL HISTORY: 75 year old female with memory loss, syncope and dizziness.  FINDINGS:  No abnormal lesions are seen on diffusion-weighted  views to suggest acute ischemia. The cortical sulci, fissures and cisterns are notable for moderate diffuse and severe left mesial temporal atrophy. Lateral, third and fourth ventricle are moderately enlarged on ex vacuo basis. No extra-axial fluid collections are seen. No evidence of mass effect or midline shift.  Moderate periventricular and subcortical and pontine chronic small vessel ischemic disease. No abnormal lesions are seen on post contrast views.  On sagittal views the posterior fossa, pituitary gland and corpus callosum are unremarkable. No evidence of intracranial hemorrhage on SWI views. The orbits and their contents,  paranasal sinuses and calvarium are unremarkable. Intracranial flow voids are present.   IMPRESSION:  Abnormal MRI brain (with and without) demonstrating: 1. Moderate diffuse and severe left mesial temporal atrophy. Moderate ventriculomegaly on ex vacuo basis. 2. Moderate periventricular and subcortical and pontine chronic small vessel ischemic disease. 3. No acute findings.    INTERPRETING PHYSICIAN:  Penni Bombard, MD Certified in Neurology, Neurophysiology and Neuroimaging  [facility] [address] [place], [place] [address] [phone]                    Result Notes     Notes Recorded by Larey Seat, MD on 11/07/2014 at 9:11 AM Brain atrophy in the area of memory centers, the mesio-temporal lobes. Patient already received this message in a personal phone call. CD          Vitals     Height Weight BMI (Calculated)    5' 2" (1.575 m) 130 lb (58.968 kg) 23.8      Interpretation Summary          Review of Systems: Out of a complete 14 system review, the patient complains of only the following symptoms, and all other reviewed systems are negative.  "Getting forgetful" sense of doom, anxiety , depression.   Stopped Celexa,  restarted in February 2016 after feeling more sad. Kim Hernandez married at age 33 , met her husband at age 12. The couple has  2 children,  Born at maternal age  54 and age 66.   Social History   Social History  . Marital status: Married    Spouse name: N/A  . Number of children: 2  . Years of education: N/A   Occupational History  . retired     worked at [employer] and [employer]   Social History Main Topics  . Smoking status: Never Smoker  . Smokeless tobacco: Never Used  . Alcohol use No     Comment: quit- had a problem w/ alcohol and has abstained for years  . Drug use: No  . Sexual activity: Not on file   Other Topics Concern  . Not on file   Social History Narrative   Uses  seat belt regularly    Family History  Problem Relation Age of Onset  . Anemia Mother   . Alzheimer's disease Mother   . Emphysema Mother     smoker  . Obesity Sister   . Emphysema Sister     smoker  . Coronary artery disease Brother     smoker  . Heart attack Brother   . Obesity Daughter   . Depression Daughter   . Alcohol abuse Son   . Depression Son   . Suicidality Son     10/12-commited suicide in a manic depressive episode    Past Medical History:  Diagnosis Date  . Alcohol abuse   . Alzheimer's disease 05/23/2010   ?  Marland Kitchen  ANXIETY DEPRESSION 05/23/2010  . Arthritis   . DEMENTIA 07/20/2008  . Depression    monopolar  . GERD 05/11/2007   ?  Marland Kitchen Hyperlipidemia   . Kidney stone   . Memory loss   . Migraine    pt denies  . OSTEOARTHRITIS, KNEE 05/23/2010  . Osteopenia   . OSTEOPOROSIS 12/17/2007  . Wrist pain, right 04/17/2011    Past Surgical History:  Procedure Laterality Date  . DILATION AND CURETTAGE OF UTERUS     forty years ago  . TONSILLECTOMY    . TOTAL KNEE ARTHROPLASTY Right     Current Outpatient Prescriptions  Medication Sig Dispense Refill  . aspirin 325 MG tablet Take 162.5 mg by mouth daily.    Marland Kitchen atorvastatin (LIPITOR) 40 MG tablet Take 1 tablet (40 mg total) by mouth daily. 90 tablet 3  . Cholecalciferol (VITAMIN D3) 1000 UNITS CAPS Take 1 capsule by mouth daily.      . citalopram (CELEXA) 20 MG tablet Take 1 tablet (20 mg total) by mouth daily. 90 tablet 3  . donepezil (ARICEPT) 10 MG tablet TAKE 1 TABLET BY MOUTH EVERY DAY 30 tablet 0  . NAMENDA XR 28 MG CP24 24 hr capsule TAKE ONE CAPSULE BY MOUTH EVERY DAY 90 capsule 1   No current facility-administered medications for this visit.     Allergies as of 03/27/2016 - Review Complete 03/27/2016  Allergen Reaction Noted  . Poison ivy extract [extract of poison ivy]  03/03/2013    Vitals: BP (!) 98/58   Pulse 84   Resp 20   Ht 5' 3" (1.6 m)   Wt 129 lb (58.5 kg)   BMI 22.85 kg/m  Last  Weight:  Wt Readings from Last 1 Encounters:  03/27/16 129 lb (58.5 kg)   Last Height:   Ht Readings from Last 1 Encounters:  03/27/16 5' 3" (1.6 m)     Physical exam:  General: The patient is awake, alert and appears not in acute distress. The patient is well groomed. Head: Normocephalic, atraumatic. Neck is supple. Mallampati 2, neck circumference: 13.5 ,  Cardiovascular:  Regular rate and rhythm, without  murmurs or carotid bruit, and without distended neck veins. Respiratory: Lungs are clear to auscultation. Skin:  Without evidence of edema, or rash Trunk: BMI is normal posture.  Neurologic exam : The patient is awake and alert, oriented to place and time.   Memory subjective described as impaired , MMSE .  There is a normal attention span & concentration ability.  Speech is fluent without  dysarthria, dysphonia - occasional  Aphasia.  Mood and affect are appropriate. She is reflecting on her "sense of doom "spells.   Cranial nerves:Pupils are equal and briskly reactive to light. Funduscopic exam without  evidence of pallor or edema.Extraocular movements  in vertical and horizontal planes intact and without nystagmus. Visual fields by finger perimetry are intact. Hearing to finger rub intact. Facial sensation intact to fine touch. Facial motor strength is symmetric and tongue and uvula move midline. Motor exam:   Normal tone and normal muscle bulk and symmetric normal strength in all extremities.  No parkinsonism. Finger-to-nose maneuver with mild tremor on the left side. Mild pronator drift . Gait and station: Patient walks without assistive device and is able to climb up to the exam table.  Not bracing herself - rises easily, Strength within normal limits. Stance is stable and normal. Steps are unfragmented. Romberg testing is normal.  Deep tendon reflexes: in the  upper and lower extremities are symmetric and intact. Babinski maneuver unable to test,  she is extremely  ticklish !  Assessment:  After physical and neurologic examination, review of laboratory studies, imaging, neurophysiology testing and pre-existing records, assessment will be reviewed on the problem list.   30 minute visit with more than 50% of the face to face time dedicated to discussion of home health needs.  Respite care options for the main caretaker and prognosis.   Able to hold her cognitive examination results/ scores between 19 and to 28 Points on Mini-Mental test over the last 4 years, a slow progression.   The patient changed to Dr Domenick Gong.   Her husband and the patient both seem to deal well with the diagnosis of Alzheimer's disease and the knowledge of a primary degenerative disorder. They also feel that if anxiety or tension strikes , they are  able to ameliorate the differences by prayer or meditation ,  reading scriptures rather than requiring an antidepressiant and periods her husband also doesn't feel that she is confused or at baseline changed very much and her personality has not changed with the disease.  I will increase the medication  ( Aricept ) to 23 mg  . I would like to her for her to follow-up every 6 months.  Plan:  Treatment plan and additional workup will be reviewed under Problem List.     Follow up every 6 month with me , 30 minutes, MMSE and MOCA.

## 2016-03-27 NOTE — Patient Instructions (Addendum)
Donepezil tablets What is this medicine? DONEPEZIL (doe NEP e zil) is used to treat mild to moderate dementia caused by Alzheimer's disease. This medicine may be used for other purposes; ask your health care provider or pharmacist if you have questions. What should I tell my health care provider before I take this medicine? They need to know if you have any of these conditions: -asthma or other lung disease -difficulty passing urine -head injury -heart disease -history of irregular heartbeat -liver disease -seizures (convulsions) -stomach or intestinal disease, ulcers or stomach bleeding -an unusual or allergic reaction to donepezil, other medicines, foods, dyes, or preservatives -pregnant or trying to get pregnant -breast-feeding How should I use this medicine? Take this medicine by mouth with a glass of water. Follow the directions on the prescription label. You may take this medicine with or without food. Take this medicine at regular intervals. This medicine is usually taken before bedtime. Do not take it more often than directed. Continue to take your medicine even if you feel better. Do not stop taking except on your doctor's advice. If you are taking the 23 mg donepezil tablet, swallow it whole; do not cut, crush, or chew it. Talk to your pediatrician regarding the use of this medicine in children. Special care may be needed. Overdosage: If you think you have taken too much of this medicine contact a poison control center or emergency room at once. NOTE: This medicine is only for you. Do not share this medicine with others. What if I miss a dose? If you miss a dose, take it as soon as you can. If it is almost time for your next dose, take only that dose, do not take double or extra doses. What may interact with this medicine? Do not take this medicine with any of the following medications: -certain medicines for fungal infections like itraconazole, fluconazole, posaconazole, and  voriconazole -cisapride -dextromethorphan; quinidine -dofetilide -dronedarone -pimozide -quinidine -thioridazine -ziprasidone This medicine may also interact with the following medications: -antihistamines for allergy, cough and cold -atropine -bethanechol -carbamazepine -certain medicines for bladder problems like oxybutynin, tolterodine -certain medicines for Parkinson's disease like benztropine, trihexyphenidyl -certain medicines for stomach problems like dicyclomine, hyoscyamine -certain medicines for travel sickness like scopolamine -dexamethasone -ipratropium -NSAIDs, medicines for pain and inflammation, like ibuprofen or naproxen -other medicines for Alzheimer's disease -other medicines that prolong the QT interval (cause an abnormal heart rhythm) -phenobarbital -phenytoin -rifampin, rifabutin or rifapentine This list may not describe all possible interactions. Give your health care provider a list of all the medicines, herbs, non-prescription drugs, or dietary supplements you use. Also tell them if you smoke, drink alcohol, or use illegal drugs. Some items may interact with your medicine. What should I watch for while using this medicine? Visit your doctor or health care professional for regular checks on your progress. Check with your doctor or health care professional if your symptoms do not get better or if they get worse. You may get drowsy or dizzy. Do not drive, use machinery, or do anything that needs mental alertness until you know how this drug affects you. What side effects may I notice from receiving this medicine? Side effects that you should report to your doctor or health care professional as soon as possible: -allergic reactions like skin rash, itching or hives, swelling of the face, lips, or tongue -changes in vision -feeling faint or lightheaded, falls -problems with balance -redness, blistering, peeling or loosening of the skin, including inside the  mouth -slow heartbeat,   or palpitations -stomach pain -unusual bleeding or bruising, red or purple spots on the skin -vomiting -weight loss Side effects that usually do not require medical attention (report to your doctor or health care professional if they continue or are bothersome): -diarrhea, especially when starting treatment -headache -indigestion or heartburn -loss of appetite -muscle cramps -nausea This list may not describe all possible side effects. Call your doctor for medical advice about side effects. You may report side effects to FDA at 1-800-FDA-1088. Where should I keep my medicine? Keep out of reach of children. Store at room temperature between 15 and 30 degrees C (59 and 86 degrees F). Throw away any unused medicine after the expiration date. NOTE: This sheet is a summary. It may not cover all possible information. If you have questions about this medicine, talk to your doctor, pharmacist, or health care provider.    2016, Elsevier/Gold Standard. (2014-03-17 07:51:52)   I will see you in 3 month and after that again every 6 month.  I want to make sure the ARICEPT 23 mg is working for you. If you feel Ok with using it and have no side effects, and you can be seen every 6 month and  We can prescribe 90 days at a time.   Raman Featherston, MD

## 2016-03-28 ENCOUNTER — Encounter: Payer: Self-pay | Admitting: Neurology

## 2016-04-06 ENCOUNTER — Other Ambulatory Visit: Payer: Self-pay | Admitting: Neurology

## 2016-04-16 ENCOUNTER — Other Ambulatory Visit: Payer: Self-pay | Admitting: Neurology

## 2016-04-16 DIAGNOSIS — F32A Depression, unspecified: Secondary | ICD-10-CM

## 2016-04-16 DIAGNOSIS — F329 Major depressive disorder, single episode, unspecified: Secondary | ICD-10-CM

## 2016-04-16 NOTE — Telephone Encounter (Signed)
Patient's husband is calling. He says the patient now has severe depression. Is there a physician she can be referred to for this condition? Please call and advise. If no answer ok to leave VM.

## 2016-04-16 NOTE — Telephone Encounter (Signed)
I spoke to Dr. Vickey Hugerohmeier. She is agreeable to referring pt to psychiatry and recommends Dr. Nolen MuMcKinney. I spoke to pt's husband and advised him of this and gave him Dr. Loralie ChampagneMcKinney's office number. Pt's husband verbalized understanding.

## 2016-04-16 NOTE — Addendum Note (Signed)
Addended by: Geronimo RunningINKINS, Kendry Pfarr A on: 04/16/2016 05:00 PM   Modules accepted: Orders

## 2016-04-18 MED ORDER — DONEPEZIL HCL 10 MG PO TABS: 10.0000 mg | ORAL_TABLET | Freq: Every day | ORAL | 3 refills | Status: DC

## 2016-04-18 NOTE — Addendum Note (Signed)
Addended by: Geronimo RunningINKINS, Gao Mitnick A on: 04/18/2016 02:58 PM   Modules accepted: Orders

## 2016-04-18 NOTE — Telephone Encounter (Signed)
I spoke to Kim Hernandez's husband and advised him that Dr. Vickey Hugerohmeier does say that the aricept can cause diarrhea and depression and say that Kim Hernandez should decrease aricept to 10mg  daily. Kim Hernandez's husband [facility] [address]. Kim Hernandez's husband verbalized understanding.

## 2016-04-18 NOTE — Telephone Encounter (Signed)
I spoke to Dr. Vickey Hugerohmeier. She says that depression and diarrhea are both common side effects of aricept and that pt should start donepezil 10mg  daily.  I called pt's husband to discuss. The line rang busy. Will try again later.

## 2016-04-18 NOTE — Telephone Encounter (Signed)
Pt's husband called in stating that since increasing the dose donepezil (ARICEPT) 23 MG TABS tablet. Pt has become depressed and also has diarrhea. Are these side effects and if so can the dosage be decrease? Please call and advise

## 2016-04-18 NOTE — Telephone Encounter (Signed)
Called and spoke to patient . Gave her to groups to choose from . Dr. Milagros EvenerKaur Rupinder telephone [phone] [place] - telephone [phone] .  Both groups require Patient to call to schedule there apt. Patient will call me back at [phone] with her decision and records will be sent when  Patient calls back. Thanks Annabelle Harmanana.

## 2016-04-18 NOTE — Telephone Encounter (Signed)
Pt's husband called to advise Dr Nolen MuMcKinney is not accepting new patients at this time. He is wanting to know where she can be referred to.

## 2016-04-18 NOTE — Telephone Encounter (Signed)
Kim HarmanDana, can you find a psychiatrist that is accepting new patients and this pt's insurance? Pt has already tried Dr. Nolen MuMcKinney but she is not accepting new patients. The referral is in but I can change it if needed.  Thanks!

## 2016-05-01 NOTE — Telephone Encounter (Signed)
Called patient to follow back and they relayed they were going to hold off on psychiatry referral . Aricept since dose change she is better. Per Husband and patient.

## 2016-06-21 DIAGNOSIS — Z23 Encounter for immunization: Secondary | ICD-10-CM | POA: Diagnosis not present

## 2016-06-26 ENCOUNTER — Ambulatory Visit: Payer: Medicare Other | Admitting: Neurology

## 2016-08-22 ENCOUNTER — Telehealth: Payer: Self-pay | Admitting: Neurology

## 2016-08-22 ENCOUNTER — Encounter: Payer: Self-pay | Admitting: Neurology

## 2016-08-22 NOTE — Telephone Encounter (Signed)
error 

## 2016-08-27 ENCOUNTER — Telehealth: Payer: Self-pay

## 2016-08-27 NOTE — Telephone Encounter (Signed)
I completed pa for namenda XR for [employer]. Should have a determination in 3-5 business days.

## 2016-09-09 NOTE — Telephone Encounter (Signed)
[employer] has approved coverage of namenda through 08/27/17.

## 2016-09-25 ENCOUNTER — Encounter: Payer: Self-pay | Admitting: Neurology

## 2016-09-25 ENCOUNTER — Ambulatory Visit (INDEPENDENT_AMBULATORY_CARE_PROVIDER_SITE_OTHER): Payer: Medicare Other | Admitting: Neurology

## 2016-09-25 VITALS — BP 100/56 | HR 78 | Resp 14 | Ht 63.0 in | Wt 135.0 lb

## 2016-09-25 DIAGNOSIS — G308 Other Alzheimer's disease: Secondary | ICD-10-CM | POA: Diagnosis not present

## 2016-09-25 DIAGNOSIS — F03B0 Unspecified dementia, moderate, without behavioral disturbance, psychotic disturbance, mood disturbance, and anxiety: Secondary | ICD-10-CM

## 2016-09-25 DIAGNOSIS — F0280 Dementia in other diseases classified elsewhere without behavioral disturbance: Secondary | ICD-10-CM | POA: Diagnosis not present

## 2016-09-25 DIAGNOSIS — F0390 Unspecified dementia without behavioral disturbance: Secondary | ICD-10-CM | POA: Diagnosis not present

## 2016-09-25 MED ORDER — DONEPEZIL HCL 10 MG PO TABS: 10.0000 mg | ORAL_TABLET | Freq: Every day | ORAL | 3 refills | Status: DC

## 2016-09-25 MED ORDER — MEMANTINE HCL ER 28 MG PO CP24: 28.0000 mg | ORAL_CAPSULE | Freq: Every day | ORAL | 0 refills | Status: DC

## 2016-09-25 NOTE — Progress Notes (Signed)
[facility]  Provider:  Dr [name] Referring Provider: [name], MD Primary Care Physician:  [name], MD  Chief Complaint  Patient presents with  . Follow-up    Rm 11.     HPI:  Kim Hernandez is a 76 y.o. female here as a revisit  from Dr. Osborne Casco for memory loss.    04-13-15 Kim Hernandez is a right-handed, married Caucasian female patient presented in early 2009 for memory evaluations,  Her clinical course and her imaging studies are consistent with a diagnosis of Alzheimer's disease. The patient has always been aware of the diagnosis. She started on Aricept and reported no side effects from the medication. She reports no sundowning,  no change in weight.  no change in smell or taste,  has a good appetite and is socially active as well as physically active. Kim Hernandez is able to orient herself in time frame throughout the day, knows when its morshe knows the date, but today stated it is Mob nday, when it was actually Friday.    She remains completely independent in her daily life with all activities of daily living. She did get lost twice  and therefore stopped driving in 2694 .  In May 2012  28/30 the patient underwent a PET scan which showed the evidence the distribution of angles indicative of Alzheimer's -her short-term memory is still the only deficit. sometime she had trouble naming people or misplacing things,on a Mini-Mental test in November 2012 her Mini-Mental Status Examination was 25 points and her animal fluency test 21 points In August  2014 her Mini-Mental Status Examination was 22 and animal fluency test 20 be also added a MOCA , in which  she did very well- with the trail making and visual spatial demands of that test, and  to 24 words.  She was quite frustrated with a five-minute recall failure.   "I am still playing Scrabble but I just don't win anymore " - Today's geriatric depression score was endorsed at 4 points, and the  patient's Mini-Mental Status Examination revealed 19 out of 30 points. Her main problem is the immediate recall of 3 words and she also had some trouble counting backwards by sevens. No is a disorientation to season year day of the week. She was aware of the month of August.  Interval history from 03/27/2016,The patient had a very slow progression of memory loss and actually score today 21 points. In comparison to November 2012 her Mini-Mental status result was 25 points. The patient has been followed for memory loss since 2009 at a very slow progression. The couple will be married 54 year on May 1st 2018.   Interval history from 09/25/2016, have pleasure of seeing Mr. and Kim Hernandez today. Kim Hernandez is diagnosed with Alzheimer's disease and has been followed regularly for memory testing. She score today is 18 out of 30 points. Her diagnosis was confirmed after at PET scan in May 2012. I have followed her since then over 6 years now. She presented actually in early 2009 first for minor memory lapses. She has now a 8 year history of slow progressive, no step by step decline , no hallucinations.       MMSE - Mini Mental State Exam 09/25/2016 03/27/2016 03/27/2016 04/13/2015  Orientation to time 3 2 - 1  Orientation to Place 3 3 - 4  Registration '3 3 3 3  '$ Attention/ Calculation 0 '5 5 2  '$ Recall 0 0 0 0  Language- name 2 objects '2 2 2 2  '$ Language- repeat '1 1 1 1  '$ Language- follow 3 step command '3 3 3 3  '$ Language- read & follow direction '1 1 1 1  '$ Write a sentence '1 1 1 1  '$ Copy design 1 0 0 1  Total score 18 21 - 19        STUDY DATE: 10/26/14 PATIENT NAME: Kim Hernandez DOB: Jul 06, 1941 MRN: [id number]  ORDERING CLINICIAN: Larey Seat, MD  CLINICAL HISTORY: 76 year old female with memory loss, syncope and dizziness.  FINDINGS:  No abnormal lesions are seen on diffusion-weighted views to suggest acute ischemia. The cortical sulci, fissures and cisterns are notable for moderate diffuse  and severe left mesial temporal atrophy. Lateral, third and fourth ventricle are moderately enlarged on ex vacuo basis. No extra-axial fluid collections are seen. No evidence of mass effect or midline shift.  Moderate periventricular and subcortical and pontine chronic small vessel ischemic disease. No abnormal lesions are seen on post contrast views.  On sagittal views the posterior fossa, pituitary gland and corpus callosum are unremarkable. No evidence of intracranial hemorrhage on SWI views. The orbits and their contents, paranasal sinuses and calvarium are unremarkable. Intracranial flow voids are present.   IMPRESSION:  Abnormal MRI brain (with and without) demonstrating: 1. Moderate diffuse and severe left mesial temporal atrophy. Moderate ventriculomegaly on ex vacuo basis. 2. Moderate periventricular and subcortical and pontine chronic small vessel ischemic disease. 3. No acute findings.    INTERPRETING PHYSICIAN:  Penni Bombard, MD Certified in Neurology, Neurophysiology and Neuroimaging  [facility] [address], [place], [place] [address] [phone]                    Result Notes     Notes Recorded by Larey Seat, MD on 11/07/2014 at 9:11 AM Brain atrophy in the area of memory centers, the mesio-temporal lobes. Patient already received this message in a personal phone call. CD          Vitals     Height Weight BMI (Calculated)    '5\' 2"'$  (1.575 m) 130 lb (58.968 kg) 23.8      Interpretation Summary          Review of Systems: Out of a complete 14 system review, the patient complains of only the following symptoms, and all other reviewed systems are negative.  "Getting forgetful" sense of doom, anxiety , depression.   Stopped Celexa,  restarted in February 2016 after feeling more sad. Kim Hernandez married at age 10 , met her husband at age 35. The couple has  2 children,  Born at maternal age  60 and  age 30.   Social History   Social History  . Marital status: Married    Spouse name: N/A  . Number of children: 2  . Years of education: N/A   Occupational History  . retired     worked at [employer] and [employer]   Social History Main Topics  . Smoking status: Never Smoker  . Smokeless tobacco: Never Used  . Alcohol use No     Comment: quit- had a problem w/ alcohol and has abstained for years  . Drug use: No  . Sexual activity: Not on file   Other Topics Concern  . Not on file   Social History Narrative   Uses seat belt regularly    Family History  Problem Relation Age of Onset  .  Anemia Mother   . Alzheimer's disease Mother   . Emphysema Mother     smoker  . Obesity Sister   . Emphysema Sister     smoker  . Coronary artery disease Brother     smoker  . Heart attack Brother   . Obesity Daughter   . Depression Daughter   . Alcohol abuse Son   . Depression Son   . Suicidality Son     10/12-commited suicide in a manic depressive episode    Past Medical History:  Diagnosis Date  . Alcohol abuse   . Alzheimer's disease 05/23/2010   ?  . ANXIETY DEPRESSION 05/23/2010  . Arthritis   . DEMENTIA 07/20/2008  . Depression    monopolar  . GERD 05/11/2007   ?  Marland Kitchen Hyperlipidemia   . Kidney stone   . Memory loss   . Migraine    pt denies  . OSTEOARTHRITIS, KNEE 05/23/2010  . Osteopenia   . OSTEOPOROSIS 12/17/2007  . Wrist pain, right 04/17/2011    Past Surgical History:  Procedure Laterality Date  . DILATION AND CURETTAGE OF UTERUS     forty years ago  . TONSILLECTOMY    . TOTAL KNEE ARTHROPLASTY Right     Current Outpatient Prescriptions  Medication Sig Dispense Refill  . aspirin 81 MG tablet Take 81 mg by mouth daily.    Marland Kitchen atorvastatin (LIPITOR) 40 MG tablet Take 1 tablet (40 mg total) by mouth daily. 90 tablet 3  . Cholecalciferol (VITAMIN D3) 1000 UNITS CAPS Take 1 capsule by mouth daily.      . citalopram (CELEXA) 20 MG tablet Take 1 tablet (20 mg total)  by mouth daily. 90 tablet 3  . donepezil (ARICEPT) 10 MG tablet Take 1 tablet (10 mg total) by mouth at bedtime. 30 tablet 3  . NAMENDA XR 28 MG CP24 24 hr capsule TAKE 1 CAPSULE BY MOUTH EVERY DAY 90 capsule 0   No current facility-administered medications for this visit.     Allergies as of 09/25/2016 - Review Complete 09/25/2016  Allergen Reaction Noted  . Poison ivy extract [poison ivy extract]  03/03/2013    Vitals: BP (!) 100/56   Pulse 78   Resp 14   Ht '5\' 3"'$  (1.6 m)   Wt 135 lb (61.2 kg)   BMI 23.91 kg/m  Last Weight:  Wt Readings from Last 1 Encounters:  09/25/16 135 lb (61.2 kg)   Last Height:   Ht Readings from Last 1 Encounters:  09/25/16 '5\' 3"'$  (1.6 m)     Physical exam:  General: The patient is awake, alert and appears not in acute distress. The patient is well groomed. Head: Normocephalic, atraumatic. Neck is supple. Mallampati 2, neck circumference: 13.5 ,  Cardiovascular:  Regular rate and rhythm, without  murmurs or carotid bruit, and without distended neck veins. Respiratory: Lungs are clear to auscultation. Skin:  Without evidence of edema, or rash Trunk: BMI is normal posture.  Neurologic exam : The patient is awake and alert, oriented to place and time.   Memory subjective described as impaired , but the patient appears happy and outgoing. Almost euphoric.  Speech is fluent without  dysarthria, dysphonia - occasional  Aphasia.  Mood and affect are appropriate. She was reflecting on her "sense of doom "spells last visit, now states she can't recall any. .   Cranial nerves:  She reports no changes in sense of smell or taste. Her appetite is good.  Pupils are equal  and briskly reactive to light. Visual fields by finger perimetry are intact. Hearing to finger rub intact. Facial sensation intact to fine touch. Facial motor strength is symmetric and tongue and uvula move midline. Motor exam:   Normal tone and normal muscle bulk and symmetric normal  strength in all extremities.  No parkinsonism. Finger-to-nose maneuver with mild tremor on the left side. Mild pronator drift . Gait and station: Patient walks without assistive device and is able to climb up to the exam table.  Not bracing herself - rises easily with her arms crossed on the chest.   Strength within normal limits.   Deep tendon reflexes: in the  upper and lower extremities are symmetric and intact. Babinski maneuver unable to test,  she is extremely ticklish !  Assessment:  After physical and neurologic examination, review of laboratory studies, imaging, neurophysiology testing and pre-existing records, assessment will be reviewed on the problem list.  30 minute visit with more than 50% of the face to face time dedicated to a neurodegenerative disorder associated with a slow progression of memory decline.  Kim Hernandez can perform most activities of daily living, she dresses herself, she has no trouble eating and her appetite has not changed she is sleeping well. At this time there is no need for any home health intervention. This is confirmed by her husband. The patient is no longer driving. discussion of home health needs. Her husband has addressed some concerns about progressive decline in cognitive function, the couple has lived now for over 2 decades at [facility], 1 acre property. Respite care options again discussed, these are costly.  She tolerates 28 mg daily of Namenda very well, she could not tolerate an increase in Aricept. This gave her nausea and side effects to GI system.  She will stay on current Meds indefinably. She will use melatonin if her sleep changes.   The patient's  PCP:  Dr Domenick Gong.   Follow up every 6 month with me , 30 minutes, MMSE and MOCA.      Theopolis Sloop, MD  CC DR

## 2016-09-25 NOTE — Patient Instructions (Signed)
Alzheimer Disease Caregiver Guide A person who has Alzheimer disease may not be able to take care of himself or herself. He or she may need help with simple tasks. The tips below can help you care for the person. Memory loss and confusion If the person is confused or cannot remember things:  Stay calm.  Respond with a short answer.  Avoid correcting him or her in a way that sounds like scolding.  Try not to take it personally, even if he or she forgets your name.  Behavior changes The person may go through behavior changes. This can include depression, anxiety, anger, or seeing things that are not there. When behavior changes:  Try not to take behavior changes personally.  Stay calm and patient.  Do not argue or try to convince the person about a specific point.  Know that these changes are part of the disease process. Try to work through it.  Tips to lessen frustration  Make appointments and do daily tasks when the person is at his or her best.  Take your time. Simple tasks may take longer. Allow plenty of time to complete tasks.  Limit choices for the person.  Involve the person in what you are doing.  Stick to a routine.  Avoid new or crowded places, if possible.  Use simple words, short sentences, and a calm voice. Only give 1 direction at a time.  Buy clothes and shoes that are easy to put on and take off.  Let people help if they offer. Home safety  Keep floors clear. Remove rugs, magazine racks, and floor lamps.  Keep hallways well lit.  Put a handrail and nonslip mat in the bathtub or shower.  Put childproof locks on cabinets that have dangerous items in them. These items include medicine, alcohol, guns, toxic cleaning items, sharp tools, matches, or lighters.  Place locks on doors where the person cannot see or reach them. This helps the person to not wander out of the house and get lost.  Be prepared for emergencies. Keep a list of emergency phone  numbers and addresses in a handy area. Plans for the future  Talk about finances. ? Talk about money management. People with Alzheimer disease have trouble managing their money as the disease gets worse. ? Get help from professional advisors about financial and legal matters.  Talk about future care. ? Choose a power of attorney. This is someone who can make decisions for the person with Alzheimer disease when he or she can no longer do so. ? Talk about driving and when it is the right time to stop. The person's doctor can help with this. ? If the person lives alone, make sure he or she is safe. Some people need extra help at home. Other people need more care at a nursing home or care center. Support groups Some benefits of joining a support group include:  Learning ways to manage stress.  Sharing experiences with others.  Getting emotional comfort and support.  Learning new caregiving skills as the disease progresses.  Knowing what community resources are available and taking advantage of them.  Get help if:  The person has a fever.  The person has a sudden behavior change that does not get better with calming strategies.  The person is unable to manage his or her living situation.  The person threatens you or anyone else, including himself or herself.  You are no longer able to care for the person. This information is not   intended to replace advice given to you by your health care provider. Make sure you discuss any questions you have with your health care provider. Document Released: 10/28/2011 Document Revised: 01/11/2016 Document Reviewed: 09/25/2011 Elsevier Interactive Patient Education  2017 Elsevier Inc.  

## 2016-10-02 DIAGNOSIS — E7800 Pure hypercholesterolemia, unspecified: Secondary | ICD-10-CM | POA: Diagnosis not present

## 2016-10-02 DIAGNOSIS — Z0000 Encounter for general adult medical examination without abnormal findings: Secondary | ICD-10-CM | POA: Diagnosis not present

## 2016-10-09 DIAGNOSIS — Z0000 Encounter for general adult medical examination without abnormal findings: Secondary | ICD-10-CM | POA: Diagnosis not present

## 2016-10-09 DIAGNOSIS — E7800 Pure hypercholesterolemia, unspecified: Secondary | ICD-10-CM | POA: Diagnosis not present

## 2016-10-09 DIAGNOSIS — F321 Major depressive disorder, single episode, moderate: Secondary | ICD-10-CM | POA: Diagnosis not present

## 2016-10-09 DIAGNOSIS — F0390 Unspecified dementia without behavioral disturbance: Secondary | ICD-10-CM | POA: Diagnosis not present

## 2016-10-11 ENCOUNTER — Encounter (HOSPITAL_COMMUNITY): Payer: Self-pay

## 2016-10-11 ENCOUNTER — Observation Stay (HOSPITAL_COMMUNITY)
Admission: EM | Admit: 2016-10-11 | Discharge: 2016-10-12 | Disposition: A | Discharge status: Home / Self Care | Payer: Medicare Other | Attending: Internal Medicine | Admitting: Internal Medicine

## 2016-10-11 ENCOUNTER — Emergency Department (HOSPITAL_COMMUNITY): Payer: Medicare Other

## 2016-10-11 DIAGNOSIS — F325 Major depressive disorder, single episode, in full remission: Secondary | ICD-10-CM | POA: Diagnosis not present

## 2016-10-11 DIAGNOSIS — Z79899 Other long term (current) drug therapy: Secondary | ICD-10-CM | POA: Diagnosis not present

## 2016-10-11 DIAGNOSIS — G309 Alzheimer's disease, unspecified: Secondary | ICD-10-CM | POA: Insufficient documentation

## 2016-10-11 DIAGNOSIS — Z7982 Long term (current) use of aspirin: Secondary | ICD-10-CM | POA: Insufficient documentation

## 2016-10-11 DIAGNOSIS — I959 Hypotension, unspecified: Secondary | ICD-10-CM | POA: Diagnosis not present

## 2016-10-11 DIAGNOSIS — G308 Other Alzheimer's disease: Secondary | ICD-10-CM | POA: Diagnosis not present

## 2016-10-11 DIAGNOSIS — R55 Syncope and collapse: Principal | ICD-10-CM | POA: Insufficient documentation

## 2016-10-11 DIAGNOSIS — Z96651 Presence of right artificial knee joint: Secondary | ICD-10-CM | POA: Insufficient documentation

## 2016-10-11 DIAGNOSIS — F0280 Dementia in other diseases classified elsewhere without behavioral disturbance: Secondary | ICD-10-CM | POA: Diagnosis not present

## 2016-10-11 DIAGNOSIS — F02818 Dementia in other diseases classified elsewhere, unspecified severity, with other behavioral disturbance: Secondary | ICD-10-CM | POA: Diagnosis present

## 2016-10-11 DIAGNOSIS — F0281 Dementia in other diseases classified elsewhere with behavioral disturbance: Secondary | ICD-10-CM | POA: Diagnosis present

## 2016-10-11 DIAGNOSIS — E785 Hyperlipidemia, unspecified: Secondary | ICD-10-CM | POA: Diagnosis not present

## 2016-10-11 DIAGNOSIS — F0390 Unspecified dementia without behavioral disturbance: Secondary | ICD-10-CM | POA: Diagnosis not present

## 2016-10-11 DIAGNOSIS — G301 Alzheimer's disease with late onset: Secondary | ICD-10-CM | POA: Diagnosis present

## 2016-10-11 LAB — BASIC METABOLIC PANEL
ANION GAP: 10 (ref 5–15)
BUN: 21 mg/dL — ABNORMAL HIGH (ref 6–20)
CALCIUM: 9.1 mg/dL (ref 8.9–10.3)
CO2: 24 mmol/L (ref 22–32)
CREATININE: 0.72 mg/dL (ref 0.44–1.00)
Chloride: 104 mmol/L (ref 101–111)
GFR calc Af Amer: >60 mL/min (ref >60)
Glucose, Bld: 114 mg/dL — ABNORMAL HIGH (ref 65–99)
Potassium: 3.7 mmol/L (ref 3.5–5.1)
Sodium: 138 mmol/L (ref 135–145)

## 2016-10-11 LAB — CBC
HCT: 43.1 % (ref 36.0–46.0)
HEMOGLOBIN: 14.4 g/dL (ref 12.0–15.0)
MCH: 31.2 pg (ref 26.0–34.0)
MCHC: 33.4 g/dL (ref 30.0–36.0)
MCV: 93.5 fL (ref 78.0–100.0)
PLATELETS: 232 10*3/uL (ref 150–400)
RBC: 4.61 MIL/uL (ref 3.87–5.11)
RDW: 11.8 % (ref 11.5–15.5)
WBC: 9 10*3/uL (ref 4.0–10.5)

## 2016-10-11 LAB — URINALYSIS, ROUTINE W REFLEX MICROSCOPIC
Glucose, UA: NEGATIVE mg/dL
Hgb urine dipstick: NEGATIVE
KETONES UR: 5 mg/dL — AB
Leukocytes, UA: NEGATIVE
NITRITE: NEGATIVE
PROTEIN: NEGATIVE mg/dL
SPECIFIC GRAVITY, URINE: 1.028 (ref 1.005–1.030)
pH: 5 (ref 5.0–8.0)

## 2016-10-11 LAB — CBG MONITORING, ED: GLUCOSE-CAPILLARY: 100 mg/dL — AB (ref 65–99)

## 2016-10-11 LAB — I-STAT TROPONIN, ED: TROPONIN I, POC: 0 ng/mL (ref 0.00–0.08)

## 2016-10-11 MED ORDER — ACETAMINOPHEN 650 MG RE SUPP: 650.0000 mg | Freq: Four times a day (QID) | RECTAL | Status: DC | PRN

## 2016-10-11 MED ORDER — ATORVASTATIN CALCIUM 40 MG PO TABS
40.0000 mg | ORAL_TABLET | Freq: Every day | ORAL | Status: DC
  Administered 2016-10-12: 40 mg via ORAL
  Filled 2016-10-11: qty 1

## 2016-10-11 MED ORDER — ONDANSETRON HCL 4 MG PO TABS: 4.0000 mg | ORAL_TABLET | Freq: Four times a day (QID) | ORAL | Status: DC | PRN

## 2016-10-11 MED ORDER — VITAMIN D3 25 MCG (1000 UNIT) PO TABS
1000.0000 [IU] | ORAL_TABLET | Freq: Every day | ORAL | Status: DC
  Administered 2016-10-12: 1000 [IU] via ORAL
  Filled 2016-10-11 (×2): qty 1

## 2016-10-11 MED ORDER — MEMANTINE HCL ER 28 MG PO CP24
28.0000 mg | ORAL_CAPSULE | Freq: Every day | ORAL | Status: DC
  Administered 2016-10-12: 28 mg via ORAL
  Filled 2016-10-11: qty 1

## 2016-10-11 MED ORDER — ASPIRIN 81 MG PO CHEW
81.0000 mg | CHEWABLE_TABLET | Freq: Every day | ORAL | Status: DC
  Administered 2016-10-12: 81 mg via ORAL
  Filled 2016-10-11: qty 1

## 2016-10-11 MED ORDER — ONDANSETRON HCL 4 MG/2ML IJ SOLN: 4.0000 mg | Freq: Four times a day (QID) | INTRAMUSCULAR | Status: DC | PRN

## 2016-10-11 MED ORDER — SODIUM CHLORIDE 0.9% FLUSH
3.0000 mL | Freq: Two times a day (BID) | INTRAVENOUS | Status: DC
  Administered 2016-10-11 – 2016-10-12 (×2): 3 mL via INTRAVENOUS

## 2016-10-11 MED ORDER — CITALOPRAM HYDROBROMIDE 20 MG PO TABS
20.0000 mg | ORAL_TABLET | Freq: Every day | ORAL | Status: DC
  Administered 2016-10-12: 20 mg via ORAL
  Filled 2016-10-11: qty 1

## 2016-10-11 MED ORDER — DONEPEZIL HCL 10 MG PO TABS
10.0000 mg | ORAL_TABLET | Freq: Every day | ORAL | Status: DC
  Administered 2016-10-11: 10 mg via ORAL
  Filled 2016-10-11: qty 1

## 2016-10-11 MED ORDER — ACETAMINOPHEN 325 MG PO TABS: 650.0000 mg | ORAL_TABLET | Freq: Four times a day (QID) | ORAL | Status: DC | PRN

## 2016-10-11 NOTE — ED Notes (Signed)
Patient emerged from doorway.  Asking about dinner tray, stating she needs to get decent to "go to that place".  This RN Reassured patient, assisted her back to bed, and informed her dinner tray had been ordered.

## 2016-10-11 NOTE — ED Provider Notes (Signed)
[facility]-EMERGENCY DEPT Provider Note   CSN: [id number] Arrival date & time: 10/11/16  1301     History   Chief Complaint Chief Complaint  Patient presents with  . Loss of Consciousness    HPI Kim Hernandez is a 76 y.o. female.  HPI  76 y.o. female with a hx of Alzheimer Dementia, HLD, presents to the Emergency Department today due to LOC x 4 in the past eight days. With one episode lasting around 5 min. Last syncopal episode around 0400. Pt does not remember events that occurred. This was witnessed. No seizure activity. Went to PCP office today and told it was likely orthostatic Hypotension with vital showing: Laying: 90/54 HR 71, Sitting 84/64 HR 72, and Standing 94/60 HR 70. Pt denies any pain. No CP/SOB/ABD pain. No N/V. Notes diarrhea intermittently for the past few days. No URI symptoms. No fevers. No hx CVA. No headache or visual changes. No other symptoms noted.   Past Medical History:  Diagnosis Date  . Alcohol abuse   . Alzheimer's disease 05/23/2010   ?  . ANXIETY DEPRESSION 05/23/2010  . Arthritis   . DEMENTIA 07/20/2008  . Depression    monopolar  . GERD 05/11/2007   ?  Marland Kitchen Hyperlipidemia   . Kidney stone   . Memory loss   . Migraine    pt denies  . OSTEOARTHRITIS, KNEE 05/23/2010  . Osteopenia   . OSTEOPOROSIS 12/17/2007  . Wrist pain, right 04/17/2011    Patient Active Problem List   Diagnosis Date Noted  . Syncopal episodes 06/02/2013  . Hyperlipidemia 04/17/2011  . Routine general medical examination at a health care facility 04/17/2011  . Major depression in remission (HCC) 05/23/2010  . ALZHEIMER'S DISEASE 05/23/2010  . OSTEOARTHRITIS, KNEE 05/23/2010  . Osteoporosis 12/17/2007    Past Surgical History:  Procedure Laterality Date  . DILATION AND CURETTAGE OF UTERUS     forty years ago  . TONSILLECTOMY    . TOTAL KNEE ARTHROPLASTY Right     OB History    No data available       Home Medications    Prior to Admission medications     Medication Sig Start Date End Date Taking? Authorizing Provider  aspirin 81 MG tablet Take 81 mg by mouth daily.    Historical Provider, MD  atorvastatin (LIPITOR) 40 MG tablet Take 1 tablet (40 mg total) by mouth daily. 10/02/15   Myrlene Broker, MD  Cholecalciferol (VITAMIN D3) 1000 UNITS CAPS Take 1 capsule by mouth daily.      Historical Provider, MD  citalopram (CELEXA) 20 MG tablet Take 1 tablet (20 mg total) by mouth daily. 10/30/15   Myrlene Broker, MD  donepezil (ARICEPT) 10 MG tablet Take 1 tablet (10 mg total) by mouth at bedtime. 09/25/16   Porfirio Mylar Dohmeier, MD  memantine (NAMENDA XR) 28 MG CP24 24 hr capsule Take 1 capsule (28 mg total) by mouth daily. 09/25/16   Melvyn Novas, MD    Family History Family History  Problem Relation Age of Onset  . Anemia Mother   . Alzheimer's disease Mother   . Emphysema Mother     smoker  . Obesity Sister   . Emphysema Sister     smoker  . Coronary artery disease Brother     smoker  . Heart attack Brother   . Obesity Daughter   . Depression Daughter   . Alcohol abuse Son   . Depression Son   . Suicidality Son  10/12-commited suicide in a manic depressive episode    Social History Social History  Substance Use Topics  . Smoking status: Never Smoker  . Smokeless tobacco: Never Used  . Alcohol use No     Comment: quit- had a problem w/ alcohol and has abstained for years     Allergies   Poison ivy extract [poison ivy extract]   Review of Systems Review of Systems ROS reviewed and all are negative for acute change except as noted in the HPI.  Physical Exam Updated Vital Signs BP 106/63 (BP Location: Left Arm)   Pulse 73   Temp 98.8 F (37.1 C) (Oral)   Resp 16   SpO2 96%   Physical Exam  Constitutional: She is oriented to person, place, and time. Vital signs are normal. She appears well-developed and well-nourished.  HENT:  Head: Normocephalic and atraumatic.  Right Ear: Hearing normal.  Left Ear:  Hearing normal.  Eyes: Conjunctivae and EOM are normal. Pupils are equal, round, and reactive to light.  Neck: Normal range of motion. Neck supple.  Cardiovascular: Normal rate, regular rhythm, normal heart sounds and intact distal pulses.   Pulmonary/Chest: Effort normal and breath sounds normal. No respiratory distress. She has no wheezes. She has no rales. She exhibits no tenderness.  Abdominal: Soft. There is no tenderness.  Musculoskeletal: Normal range of motion.  Neurological: She is alert and oriented to person, place, and time. She has normal strength. No cranial nerve deficit or sensory deficit.  Cranial Nerves:  II: Pupils equal, round, reactive to light III,IV, VI: ptosis not present, extra-ocular motions intact bilaterally  V,VII: smile symmetric, facial light touch sensation equal VIII: hearing grossly normal bilaterally  IX,X: midline uvula rise  XI: bilateral shoulder shrug equal and strong XII: midline tongue extension  Skin: Skin is warm and dry.  Psychiatric: She has a normal mood and affect. Her speech is normal and behavior is normal. Thought content normal.  Nursing note and vitals reviewed.  ED Treatments / Results  Labs (all labs ordered are listed, but only abnormal results are displayed) Labs Reviewed  BASIC METABOLIC PANEL - Abnormal; Notable for the following:       Result Value   Glucose, Bld 114 (*)    BUN 21 (*)    All other components within normal limits  CBG MONITORING, ED - Abnormal; Notable for the following:    Glucose-Capillary 100 (*)    All other components within normal limits  CBC  URINALYSIS, ROUTINE W REFLEX MICROSCOPIC  I-STAT TROPOININ, ED   EKG  EKG Interpretation  Date/Time:  Friday October 11 2016 13:16:28 EST Ventricular Rate:  72 PR Interval:  146 QRS Duration: 82 QT Interval:  388 QTC Calculation: 424 R Axis:   50 Text Interpretation:  Normal sinus rhythm Cannot rule out Anterior infarct , age undetermined Abnormal  ECG No STEMI Confirmed by Rush LandmarkEGELER MD, CHRISTOPHER [phone]([id number]) on 10/11/2016 3:05:09 PM      Radiology Dg Chest 2 View  Result Date: 10/11/2016 CLINICAL DATA:  Syncope last night. EXAM: CHEST  2 VIEW COMPARISON:  06/02/2013 FINDINGS: The heart size and mediastinal contours are within normal limits. Both lungs are clear. The visualized skeletal structures are unremarkable. Aortic atherosclerosis. IMPRESSION: No active cardiopulmonary disease. Electronically Signed   By: Francene BoyersJames  Maxwell M.D.   On: 10/11/2016 16:07    Procedures Procedures (including critical care time)  Medications Ordered in ED Medications - No data to display   Initial Impression / Assessment and  Plan / ED Course  I have reviewed the triage vital signs and the nursing notes.  Pertinent labs & imaging results that were available during my care of the patient were reviewed by me and considered in my medical decision making (see chart for details).  Final Clinical Impressions(s) / ED Diagnoses  {I have reviewed and evaluated the relevant laboratory values. {I have reviewed and evaluated the relevant imaging studies. {I have interpreted the relevant EKG. {I have reviewed the relevant previous healthcare records.  {I obtained HPI from historian. {Patient discussed with supervising physician.  ED Course:  Assessment: Pt is a 75yF with hx Alzheimer Dementia, HLD who presents with syncopal episodes x 4 in the past 8 days. Last one was around 0400 on commode with BM. Went to PCP and told likely orthostatic. Sent to ED for eval. Noted diarrhea for the past several days. No headaches. No visual changes. No CP/SOB. On exam, pt in NAD. Nontoxic/nonseptic appearing. VSS. Afebrile. Lungs CTA. Heart RRR. Abdomen nontender soft. CN evaluated and unremarkable. CBC without anemia. BMP unremarkable. CXR unremarkable. Trop negative. EKG unremarkable for acute abnormalites. Given 1L NS bolus in ED. Seen by supervising physician. Plan is to Admit to  medicine due to Syncope. Likely dehydration related vs bradycardia as HR currently low 60s.   Disposition/Plan:  Admit Pt acknowledges and agrees with plan  Supervising Physician Canary Brim Tegeler, MD  Final diagnoses:  Syncope, unspecified syncope type    New Prescriptions New Prescriptions   No medications on file     Audry Pili, PA-C 10/11/16 1611    Canary Brim Tegeler, MD 10/14/16 1147

## 2016-10-11 NOTE — ED Notes (Signed)
Attempted report x 2 

## 2016-10-11 NOTE — ED Notes (Signed)
Daughter at bedside.

## 2016-10-11 NOTE — ED Notes (Signed)
Pt transported to xray, PA at bedside updating husband

## 2016-10-11 NOTE — ED Notes (Addendum)
Ordered dinner tray for pt, okay'd by PA

## 2016-10-11 NOTE — ED Notes (Signed)
Pt back from x-ray.

## 2016-10-11 NOTE — ED Notes (Signed)
Husband reports that pt had syncopal episode X1 week ago. He reports pt was on commode and called out to him saying get me. He reports she was out around 30 seconds and was making noises.  This morning around 0200 the pt had syncopal episode where "she went out of it for 5- 6 min" and then had another episode where "she went out for about 60 sec". Husband reports vomiting and decreased eating stating pt "has not eaten in 18 hours".

## 2016-10-11 NOTE — ED Triage Notes (Addendum)
Pt brought in by her husband. He reports she has passed out four times over the past eight days; last syncopal episode was this morning at 4am. Pt states she does not remember these events. She went to PCP today and was told she had orthostatic hypotension at her doctors office "Laying: 90/54 HR-71, sitting: 84/64 HR 72 and standing: 94/60, HR-70."

## 2016-10-11 NOTE — H&P (Signed)
[facility] History and Physical  Kim Hernandez WUJ:[id number] DOB: 07/26/1941 DOA: 10/11/2016  PCP: Gaspar Garbe, MD  Patient coming from: Home  Chief Complaint: Passing out  HPI: Kim Hernandez is a 76 y.o. female with a medical history of dementia, depression, hyperlipidemia who presented to the ER with complaints of passing out. Patient has passed out 4 times in the past 8 days. She states that they have occurred while she is on the commode. Patient states that she feels when she is going to pass out however she cannot explain the symptoms, she "just knows something is not right." Patient has also had diarrhea over the past few days however it is improving now. Per EDP, patient went to her primary care physician's office earlier today and was found to have orthostatic pressures Laying: 90/54 HR 71, Sitting 84/64 HR 72, and Standing 94/60 HR 70. Currently she denies chest pain, shortness of breath, abdominal pain, nausea or vomiting, constipation, headache, dizziness, recent travel or illness.   ED Course: Patient given one liter of NS. TRH called for admission.   Review of Systems:  All other systems reviewed and are negative.   Past Medical History:  Diagnosis Date  . Alcohol abuse   . Alzheimer's disease 05/23/2010   ?  . ANXIETY DEPRESSION 05/23/2010  . Arthritis   . DEMENTIA 07/20/2008  . Depression    monopolar  . GERD 05/11/2007   ?  Marland Kitchen Hyperlipidemia   . Kidney stone   . Memory loss   . Migraine    pt denies  . OSTEOARTHRITIS, KNEE 05/23/2010  . Osteopenia   . OSTEOPOROSIS 12/17/2007  . Wrist pain, right 04/17/2011    Past Surgical History:  Procedure Laterality Date  . DILATION AND CURETTAGE OF UTERUS     forty years ago  . TONSILLECTOMY    . TOTAL KNEE ARTHROPLASTY Right     Social History:  reports that she has never smoked. She has never used smokeless tobacco. She reports that she does not drink alcohol or use drugs.  Allergies  Allergen  Reactions  . Poison Ivy Extract [Poison Ivy Extract]     Family History  Problem Relation Age of Onset  . Anemia Mother   . Alzheimer's disease Mother   . Emphysema Mother     smoker  . Obesity Sister   . Emphysema Sister     smoker  . Coronary artery disease Brother     smoker  . Heart attack Brother   . Obesity Daughter   . Depression Daughter   . Alcohol abuse Son   . Depression Son   . Suicidality Son     10/12-commited suicide in a manic depressive episode     Prior to Admission medications   Medication Sig Start Date End Date Taking? Authorizing Provider  aspirin 81 MG tablet Take 81 mg by mouth daily.    Historical Provider, MD  atorvastatin (LIPITOR) 40 MG tablet Take 1 tablet (40 mg total) by mouth daily. 10/02/15   Myrlene Broker, MD  Cholecalciferol (VITAMIN D3) 1000 UNITS CAPS Take 1 capsule by mouth daily.      Historical Provider, MD  citalopram (CELEXA) 20 MG tablet Take 1 tablet (20 mg total) by mouth daily. 10/30/15   Myrlene Broker, MD  donepezil (ARICEPT) 10 MG tablet Take 1 tablet (10 mg total) by mouth at bedtime. 09/25/16   Porfirio Mylar Dohmeier, MD  memantine (NAMENDA XR) 28 MG CP24 24 hr capsule  Take 1 capsule (28 mg total) by mouth daily. 09/25/16   Melvyn Novas, MD    Physical Exam: Vitals:   10/11/16 1449 10/11/16 1450  BP:    Pulse: 64 63  Resp: 12 12  Temp:       General: Well developed, well nourished, NAD, appears stated age  HEENT: NCAT, PERRLA, EOMI, Anicteic Sclera, mucous membranes moist.   Neck: Supple, no JVD, no masses  Cardiovascular: S1 S2 auscultated, no rubs, murmurs or gallops. Regular rate and rhythm.  Respiratory: Clear to auscultation bilaterally with equal chest rise  Abdomen: Soft, nontender, nondistended, + bowel sounds  Extremities: warm dry without cyanosis clubbing or edema  Neuro: AAOx3, cranial nerves grossly intact. Strength 5/5 in patient's upper and lower extremities bilaterally  Skin: Without  rashes exudates or nodules  Psych: Normal affect and demeanor with intact judgement and insight  Labs on Admission: I have personally reviewed following labs and imaging studies CBC:  Recent Labs Lab 10/11/16 1317  WBC 9.0  HGB 14.4  HCT 43.1  MCV 93.5  PLT 232   Basic Metabolic Panel:  Recent Labs Lab 10/11/16 1317  NA 138  K 3.7  CL 104  CO2 24  GLUCOSE 114*  BUN 21*  CREATININE 0.72  CALCIUM 9.1   GFR: CrCl cannot be calculated (Unknown ideal weight.). Liver Function Tests: No results for input(s): AST, ALT, ALKPHOS, BILITOT, PROT, ALBUMIN in the last 168 hours. No results for input(s): LIPASE, AMYLASE in the last 168 hours. No results for input(s): AMMONIA in the last 168 hours. Coagulation Profile: No results for input(s): INR, PROTIME in the last 168 hours. Cardiac Enzymes: No results for input(s): CKTOTAL, CKMB, CKMBINDEX, TROPONINI in the last 168 hours. BNP (last 3 results) No results for input(s): PROBNP in the last 8760 hours. HbA1C: No results for input(s): HGBA1C in the last 72 hours. CBG:  Recent Labs Lab 10/11/16 1447  GLUCAP 100*   Lipid Profile: No results for input(s): CHOL, HDL, LDLCALC, TRIG, CHOLHDL, LDLDIRECT in the last 72 hours. Thyroid Function Tests: No results for input(s): TSH, T4TOTAL, FREET4, T3FREE, THYROIDAB in the last 72 hours. Anemia Panel: No results for input(s): VITAMINB12, FOLATE, FERRITIN, TIBC, IRON, RETICCTPCT in the last 72 hours. Urine analysis:    Component Value Date/Time   COLORURINE YELLOW 10/11/2016 1615   APPEARANCEUR HAZY (A) 10/11/2016 1615   LABSPEC 1.028 10/11/2016 1615   PHURINE 5.0 10/11/2016 1615   GLUCOSEU NEGATIVE 10/11/2016 1615   HGBUR NEGATIVE 10/11/2016 1615   HGBUR trace-lysed 08/01/2008 1400   BILIRUBINUR SMALL (A) 10/11/2016 1615   BILIRUBINUR n 07/28/2014 0846   KETONESUR 5 (A) 10/11/2016 1615   PROTEINUR NEGATIVE 10/11/2016 1615   UROBILINOGEN 0.2 07/28/2014 0846   UROBILINOGEN  0.2 01/26/2010 1302   NITRITE NEGATIVE 10/11/2016 1615   LEUKOCYTESUR NEGATIVE 10/11/2016 1615   Sepsis Labs: @LABRCNTIP (procalcitonin:4,lacticidven:4) )No results found for this or any previous visit (from the past 240 hour(s)).   Radiological Exams on Admission: Dg Chest 2 View  Result Date: 10/11/2016 CLINICAL DATA:  Syncope last night. EXAM: CHEST  2 VIEW COMPARISON:  06/02/2013 FINDINGS: The heart size and mediastinal contours are within normal limits. Both lungs are clear. The visualized skeletal structures are unremarkable. Aortic atherosclerosis. IMPRESSION: No active cardiopulmonary disease. Electronically Signed   By: Francene Boyers M.D.   On: 10/11/2016 16:07    EKG: Independently reviewed. Sinus rhythm, rate 61  Assessment/Plan Syncope  -Admit patient to telemetry -Suspect secondary to vasovagal response, as  patient was on the commode during these episodes of syncope -Neurochecks every 4 hours -Will obtain echocardiogram, TSH -Obtain orthostatic vitals  Alzheimer's dementia -Stable -Continue Aricept, Namenda  Depression -Continue Celexa  Hyperlipidemia -Continue statin  DVT prophylaxis: SCDs  Code Status: Full  Family Communication: None at bedside. Admission, patients condition and plan of care including tests being ordered have been discussed with the patient, who indicates understanding and agrees with the plan and Code Status.  Disposition Plan: home    Consults called: None   Admission status: Observation   Time spent: 50 minutes  Ladislaus Repsher D.O. [facility] Pager [phone]  If 7PM-7AM, please contact night-coverage [email or web address] Password [email or web address] 10/11/2016, 4:48 PM

## 2016-10-11 NOTE — ED Notes (Signed)
Pt taken off bedpan.  Unable to provide a sample at this time.  Pt's husband wanting update on admission plan.  Will follow up with Pa/MD

## 2016-10-11 NOTE — ED Notes (Signed)
ED Provider at bedside. 

## 2016-10-11 NOTE — ED Notes (Signed)
Dinner tray delivered.

## 2016-10-12 ENCOUNTER — Observation Stay (HOSPITAL_BASED_OUTPATIENT_CLINIC_OR_DEPARTMENT_OTHER): Payer: Medicare Other

## 2016-10-12 DIAGNOSIS — R0600 Dyspnea, unspecified: Secondary | ICD-10-CM

## 2016-10-12 DIAGNOSIS — R55 Syncope and collapse: Secondary | ICD-10-CM

## 2016-10-12 LAB — BASIC METABOLIC PANEL
Anion gap: 9 (ref 5–15)
BUN: 20 mg/dL (ref 6–20)
CALCIUM: 8.7 mg/dL — AB (ref 8.9–10.3)
CO2: 27 mmol/L (ref 22–32)
CREATININE: 0.78 mg/dL (ref 0.44–1.00)
Chloride: 104 mmol/L (ref 101–111)
GFR calc Af Amer: >60 mL/min (ref >60)
Glucose, Bld: 98 mg/dL (ref 65–99)
POTASSIUM: 3.4 mmol/L — AB (ref 3.5–5.1)
Sodium: 140 mmol/L (ref 135–145)

## 2016-10-12 LAB — CBC
HEMATOCRIT: 38.6 % (ref 36.0–46.0)
Hemoglobin: 12.6 g/dL (ref 12.0–15.0)
MCH: 30.8 pg (ref 26.0–34.0)
MCHC: 32.6 g/dL (ref 30.0–36.0)
MCV: 94.4 fL (ref 78.0–100.0)
Platelets: 214 10*3/uL (ref 150–400)
RBC: 4.09 MIL/uL (ref 3.87–5.11)
RDW: 12 % (ref 11.5–15.5)
WBC: 7.4 10*3/uL (ref 4.0–10.5)

## 2016-10-12 LAB — ECHOCARDIOGRAM COMPLETE
HEIGHTINCHES: 63 in
WEIGHTICAEL: 2056 [oz_av]

## 2016-10-12 LAB — TSH: TSH: 1.261 u[IU]/mL (ref 0.350–4.500)

## 2016-10-12 LAB — GLUCOSE, CAPILLARY: Glucose-Capillary: 102 mg/dL — ABNORMAL HIGH (ref 65–99)

## 2016-10-12 NOTE — Progress Notes (Signed)
Received patient from emergency room to unit 3 west bed 35.Assisted patient to bed by nursing staff.Denies pain or discomfort at present time.Patient daughter at bedside.Oriented patient and daughter to nursing unit,call bell and fall risk policy.Patient is at risk for falls due to syncope yellow socks and arm bracelet placed on patient and bed alarm set.Instructed patient and daughter how to call for assistance when using call bell and telephone verbalized understanding.Video # [id number] shown to patient and daughter.

## 2016-10-12 NOTE — Progress Notes (Signed)
Pt discharged home IV and tele removed. Husband at the bedside. Questions answered. Taken out via wheelchair.

## 2016-10-12 NOTE — Evaluation (Signed)
Physical Therapy Evaluation Patient Details Name: Kim Hernandez MRN: [id number] DOB: 12-20-1940 Today's Date: 10/12/2016   History of Present Illness  Pt adm with syncope. PMH - dementia, rt TKA, depression  Clinical Impression  Pt doing well with mobility and no further PT needed.  Ready for dc from PT standpoint.      Follow Up Recommendations No PT follow up    Equipment Recommendations  None recommended by PT    Recommendations for Other Services       Precautions / Restrictions Precautions Precautions: None Restrictions Weight Bearing Restrictions: No      Mobility  Bed Mobility Overal bed mobility: Independent                Transfers Overall transfer level: Independent                  Ambulation/Gait Ambulation/Gait assistance: Modified independent (Device/Increase time) Ambulation Distance (Feet): 300 Feet Assistive device: None Gait Pattern/deviations: Step-through pattern;Drifts right/left   Gait velocity interpretation: at or above normal speed for age/gender General Gait Details: Pt amb with steady gait  Stairs            Wheelchair Mobility    Modified Rankin (Stroke Patients Only)       Balance Overall balance assessment: Modified Independent                                           Pertinent Vitals/Pain Pain Assessment: No/denies pain    Home Living Family/patient expects to be discharged to:: Private residence Living Arrangements: Spouse/significant other Available Help at Discharge: Family;Available 24 hours/day Type of Home: House Home Access: Stairs to enter   [employer] of Steps: 1 Home Layout: One level Home Equipment: None      Prior Function Level of Independence: Independent               Hand Dominance        Extremity/Trunk Assessment   Upper Extremity Assessment Upper Extremity Assessment: Overall WFL for tasks assessed    Lower Extremity  Assessment Lower Extremity Assessment: Overall WFL for tasks assessed       Communication   Communication: No difficulties  Cognition Arousal/Alertness: Awake/alert Behavior During Therapy: WFL for tasks assessed/performed Overall Cognitive Status: History of cognitive impairments - at baseline                      General Comments      Exercises     Assessment/Plan    PT Assessment Patent does not need any further PT services  PT Problem List         PT Treatment Interventions      PT Goals (Current goals can be found in the Care Plan section)  Acute Rehab PT Goals PT Goal Formulation: All assessment and education complete, DC therapy    Frequency     Barriers to discharge        Co-evaluation               End of Session   Activity Tolerance: Patient tolerated treatment well Patient left: in bed;with call bell/phone within reach;with family/visitor present (sitting) Nurse Communication: Mobility status PT Visit Diagnosis: Unsteadiness on feet (R26.81)    Functional Assessment Tool Used: AM-PAC 6 Clicks Basic Mobility Functional Limitation: Mobility: Walking and moving around Mobility: Walking and Moving  Around Current Status [phone](G8978): 0 percent impaired, limited or restricted Mobility: Walking and Moving Around Goal Status [phone](G8979): 0 percent impaired, limited or restricted Mobility: Walking and Moving Around Discharge Status [phone](G8980): 0 percent impaired, limited or restricted    Time: [id number] PT Time Calculation (min) (ACUTE ONLY): 12 min   Charges:   PT Evaluation $PT Eval Low Complexity: 1 Procedure     PT G Codes:   PT G-Codes **NOT FOR INPATIENT CLASS** Functional Assessment Tool Used: AM-PAC 6 Clicks Basic Mobility Functional Limitation: Mobility: Walking and moving around Mobility: Walking and Moving Around Current Status ([id number](G8978): 0 percent impaired, limited or restricted Mobility: Walking and Moving Around Goal Status ([id number](G8979): 0  percent impaired, limited or restricted Mobility: Walking and Moving Around Discharge Status [phone](G8980): 0 percent impaired, limited or restricted     Angelina OkCary W [facility] NorthMaycok 10/12/2016, 12:14 PM Fluor CorporationCary Desmond Szabo PT [phone]

## 2016-10-16 NOTE — Discharge Summary (Signed)
[facility] Discharge Summary   Patient: Kim PutnamBlanche C Sharber ZOX:[id number]   PCP: Gaspar Garbeichard W Tisovec, MD DOB: 03/18/1941   Date of admission: 10/11/2016   Date of discharge: 10/12/2016    Discharge Diagnoses:  Active Problems:   Major depression in remission [facility](HCC)   Alzheimer's disease   Hyperlipidemia   Syncope  Admitted From: Home Disposition:  Home  Recommendations for Outpatient Follow-up:  1. Follow-up with PCP in one   Follow-up Information    Gaspar Garbeichard W Tisovec, MD. Schedule an appointment as soon as possible for a visit in 1 week(s).   Specialty:  Internal Medicine Contact information: [address] [place] [address] [phone]          Diet recommendation: Cardiac diet  Activity: The patient is advised to gradually reintroduce usual activities.  Discharge Condition: good  Code Status: Full code  History of present illness: As per the H and P dictated on admission, "Kim PutnamBlanche C Spake is a 76 y.o. female with a medical history of dementia, depression, hyperlipidemia who presented to the ER with complaints of passing out. Patient has passed out 4 times in the past 8 days. She states that they have occurred while she is on the commode. Patient states that she feels when she is going to pass out however she cannot explain the symptoms, she "just knows something is not right." Patient has also had diarrhea over the past few days however it is improving now. Per EDP, patient went to her primary care physician's office earlier today and was found to have orthostatic pressures Laying: 90/54 HR 71, Sitting 84/64 HR 72, and Standing 94/60 HR 70. Currently she denies chest pain, shortness of breath, abdominal pain, nausea or vomiting, constipation, headache, dizziness, recent travel or illness. "  Hospital Course:   Summary of her active problems in the hospital is as following. Syncope  -No significant event on telemetry -Suspect secondary to vasovagal response, as  patient was on the commode during these episodes of syncope -Neurochecks unremarkable -Normal EF, no valvular abnormality on echocardiogram, normal TSH -Normal orthostatic vitals  Alzheimer's dementia -Stable -Continue Aricept, Namenda  Depression -Continue Celexa  Hyperlipidemia -Continue statin  All other chronic medical condition were stable during the hospitalization.  Patient was seen by physical therapy, who recommended patient would not require any home therapy On the day of the discharge the patient's vitals were stable, and no other acute medical condition were reported by patient. the patient was felt safe to be discharge at home with family.  Procedures and Results:  Echocardiogram  Study Conclusions  - Left ventricle: The cavity size was normal. Wall thickness was   normal. Systolic function was normal. The estimated ejection   fraction was in the range of 60% to 65%. Wall motion was normal;   there were no regional wall motion abnormalities. Left   ventricular diastolic function parameters were normal. - Aortic valve: Valve area (VTI): 1.91 cm^2. Valve area (Vmax):   2.19 cm^2. Valve area (Vmean): 2.1 cm^2. - Atrial septum: No defect or patent foramen ovale was identified. - Technically adequate study  Consultations:  none  DISCHARGE MEDICATION: Discharge Medication List as of 10/12/2016  1:54 PM    CONTINUE these medications which have NOT CHANGED   Details  aspirin 81 MG tablet Take 81 mg by mouth daily., Historical Med    atorvastatin (LIPITOR) 40 MG tablet Take 1 tablet (40 mg total) by mouth daily., Starting Mon 10/02/2015, Normal    Cholecalciferol (VITAMIN  D3) 1000 UNITS CAPS Take 1 capsule by mouth daily.  , Historical Med    citalopram (CELEXA) 20 MG tablet Take 1 tablet (20 mg total) by mouth daily., Starting Mon 10/30/2015, Normal    donepezil (ARICEPT) 10 MG tablet Take 1 tablet (10 mg total) by mouth at bedtime., Starting Wed 09/25/2016,  Normal    memantine (NAMENDA XR) 28 MG CP24 24 hr capsule Take 1 capsule (28 mg total) by mouth daily., Starting Wed 09/25/2016, Normal    dexlansoprazole (DEXILANT) 60 MG capsule Take 60 mg by mouth daily with supper., Historical Med       Allergies  Allergen Reactions  . Poison Ivy Extract Rash   Discharge Instructions    Diet - low sodium heart healthy    Complete by:  As directed    Discharge instructions    Complete by:  As directed    It is important that you read following instructions as well as go over your medication list with RN to help you understand your care after this hospitalization.  Discharge Instructions: Please follow-up with PCP in one week  Please request your primary care physician to go over all Hospital Tests and Procedure/Radiological results at the follow up,  Please get all Hospital records sent to your PCP by signing hospital release before you go home.   Do not drive, operating heavy machinery, perform activities at heights, swimming or participation in water activities or provide baby sitting services as you were admitted for syncope until you have been seen by Primary Care Physician or a Neurologist and advised to do so again. Do not take more than prescribed Pain, Sleep and Anxiety Medications. You were cared for by a hospitalist during your hospital stay. If you have any questions about your discharge medications or the care you received while you were in the hospital after you are discharged, you can call the unit and ask to speak with the hospitalist on call if the hospitalist that took care of you is not available.  Once you are discharged, your primary care physician will handle any further medical issues. Please note that NO REFILLS for any discharge medications will be authorized once you are discharged, as it is imperative that you return to your primary care physician (or establish a relationship with a primary care physician if you do not have  one) for your aftercare needs so that they can reassess your need for medications and monitor your lab values. You Must read complete instructions/literature along with all the possible adverse reactions/side effects for all the Medicines you take and that have been prescribed to you. Take any new Medicines after you have completely understood and accept all the possible adverse reactions/side effects. Wear Seat belts while driving. If you have smoked or chewed Tobacco in the last 2 yrs please stop smoking and/or stop any Recreational drug use.   Increase activity slowly    Complete by:  As directed      Discharge Exam: Filed Weights   10/11/16 2100 10/12/16 0500  Weight: 59 kg (130 lb) 58.3 kg (128 lb 8 oz)   Vitals:   10/12/16 0818 10/12/16 1142  BP: (!) 120/55 (!) 120/55  Pulse: 65 64  Resp: 18 18  Temp: 99.2 F (37.3 C) 98.3 F (36.8 C)   General: Appear in no distress, no Rash; Oral Mucosa moist. Cardiovascular: S1 and S2 Present, no Murmur, no JVD Respiratory: Bilateral Air entry present and Clear to Auscultation, no Crackles, no wheezes  Abdomen: Bowel Sound present, Soft and no tenderness Extremities: no Pedal edema, no calf tenderness Neurology: Grossly no focal neuro deficit.  The results of significant diagnostics from this hospitalization (including imaging, microbiology, ancillary and laboratory) are listed below for reference.    Significant Diagnostic Studies: Dg Chest 2 View  Result Date: 10/11/2016 CLINICAL DATA:  Syncope last night. EXAM: CHEST  2 VIEW COMPARISON:  06/02/2013 FINDINGS: The heart size and mediastinal contours are within normal limits. Both lungs are clear. The visualized skeletal structures are unremarkable. Aortic atherosclerosis. IMPRESSION: No active cardiopulmonary disease. Electronically Signed   By: Francene Boyers M.D.   On: 10/11/2016 16:07    Microbiology: No results found for this or any previous visit (from the past 240 hour(s)).    Labs: CBC:  Recent Labs Lab 10/11/16 1317 10/12/16 0423  WBC 9.0 7.4  HGB 14.4 12.6  HCT 43.1 38.6  MCV 93.5 94.4  PLT 232 214   Basic Metabolic Panel:  Recent Labs Lab 10/11/16 1317 10/12/16 0423  NA 138 140  K 3.7 3.4*  CL 104 104  CO2 24 27  GLUCOSE 114* 98  BUN 21* 20  CREATININE 0.72 0.78  CALCIUM 9.1 8.7*   Liver Function Tests: No results for input(s): AST, ALT, ALKPHOS, BILITOT, PROT, ALBUMIN in the last 168 hours. No results for input(s): LIPASE, AMYLASE in the last 168 hours. No results for input(s): AMMONIA in the last 168 hours. Cardiac Enzymes: No results for input(s): CKTOTAL, CKMB, CKMBINDEX, TROPONINI in the last 168 hours. BNP (last 3 results) No results for input(s): BNP in the last 8760 hours. CBG:  Recent Labs Lab 10/11/16 1447 10/12/16 0809  GLUCAP 100* 102*   Time spent: 30 minutes  Signed:  Cynara Tatham  [facility] 10/12/2016 , 3:38 PM

## 2016-10-17 DIAGNOSIS — I959 Hypotension, unspecified: Secondary | ICD-10-CM | POA: Diagnosis not present

## 2016-10-17 DIAGNOSIS — R55 Syncope and collapse: Secondary | ICD-10-CM | POA: Diagnosis not present

## 2016-10-17 DIAGNOSIS — E7800 Pure hypercholesterolemia, unspecified: Secondary | ICD-10-CM | POA: Diagnosis not present

## 2016-10-17 DIAGNOSIS — F0390 Unspecified dementia without behavioral disturbance: Secondary | ICD-10-CM | POA: Diagnosis not present

## 2016-10-18 ENCOUNTER — Encounter: Payer: Self-pay | Admitting: Neurology

## 2016-10-26 ENCOUNTER — Other Ambulatory Visit: Payer: Self-pay | Admitting: Internal Medicine

## 2016-10-31 ENCOUNTER — Emergency Department
Admission: EM | Admit: 2016-10-31 | Discharge: 2016-10-31 | Disposition: A | Discharge status: Home / Self Care | Payer: Medicare Other | Source: Home / Self Care | Attending: Emergency Medicine | Admitting: Emergency Medicine

## 2016-10-31 ENCOUNTER — Encounter: Payer: Self-pay | Admitting: Emergency Medicine

## 2016-10-31 DIAGNOSIS — R197 Diarrhea, unspecified: Secondary | ICD-10-CM

## 2016-10-31 DIAGNOSIS — R5381 Other malaise: Secondary | ICD-10-CM

## 2016-10-31 DIAGNOSIS — R5383 Other fatigue: Secondary | ICD-10-CM

## 2016-10-31 LAB — COMPLETE METABOLIC PANEL WITH GFR
ALT: 30 U/L — ABNORMAL HIGH (ref 6–29)
AST: 27 U/L (ref 10–35)
Albumin: 3.8 g/dL (ref 3.6–5.1)
Alkaline Phosphatase: 80 U/L (ref 33–130)
BUN: 15 mg/dL (ref 7–25)
CO2: 25 mmol/L (ref 20–31)
Calcium: 8.9 mg/dL (ref 8.6–10.4)
Chloride: 105 mmol/L (ref 98–110)
Creat: 0.74 mg/dL (ref 0.60–0.93)
GFR, Est African American: >89 mL/min (ref >=60)
GFR, Est Non African American: 79 mL/min (ref >=60)
Glucose, Bld: 100 mg/dL — ABNORMAL HIGH (ref 65–99)
Potassium: 3.3 mmol/L — ABNORMAL LOW (ref 3.5–5.3)
Sodium: 139 mmol/L (ref 135–146)
Total Bilirubin: 0.4 mg/dL (ref 0.2–1.2)
Total Protein: 6.3 g/dL (ref 6.1–8.1)

## 2016-10-31 LAB — POCT URINALYSIS DIP (MANUAL ENTRY)
Bilirubin, UA: NEGATIVE
Glucose, UA: NEGATIVE
Ketones, POC UA: NEGATIVE
Leukocytes, UA: NEGATIVE
Nitrite, UA: NEGATIVE
Protein Ur, POC: NEGATIVE
Spec Grav, UA: 1.025
Urobilinogen, UA: 0.2
pH, UA: 6

## 2016-10-31 LAB — POCT CBC W AUTO DIFF (K'VILLE URGENT CARE)

## 2016-10-31 MED ORDER — LOPERAMIDE HCL 1 MG/5ML PO LIQD: 1.0000 mg | Freq: Two times a day (BID) | ORAL | 0 refills | Status: AC | PRN

## 2016-10-31 NOTE — ED Notes (Signed)
Orthostatic BP Lying 118/75 Sitting 120/79 Standing 142/73

## 2016-10-31 NOTE — ED Provider Notes (Signed)
Kim [facility] C[facility]    CSN: [id number] Arrival date & time: 10/31/16  1140     History   Chief Complaint Chief Complaint  Patient presents with  . Diarrhea    HPI Kim Hernandez is a 76 y.o. female.  Goes by "Kim Hernandez" HPI [name]  Complains of "diarrhea off-and-on for about 2 weeks". They cannot quantify with how frequent bowel movements have been for the past 2 weeks. However, the frequency of bowel movements increased about 3 days ago, about 5 or 6 small volume stools per day, they think it's watery stool, brown, occasionally dark ever since they started using Pepto-Bismol. No definite blood or mucus. Appetite is good. Denies abdominal pain. Denies nausea or vomiting. Denies fever or chills or any acute myalgias or arthralgias. Denies recent syncope or any falls in the past 2 weeks. Denies any recent travel. No history of tick bite or insect bite. I asked her nurses to check orthostatic vital signs, and there is no significant orthostatic change on vital signs today.  Reviewed past medical history below including Alzheimer's disease. Husband states that PCP advised her to take Pepto-Bismol after each bowel movement the past week.  Associated symptoms: Denies chest pain or shortness of breath. Questionable urinary frequency and dysuria, but patient is not sure of this. Denies focal weakness.  I reviewed discharge summary 10/12/2016, from 1 day hospitalization for syncope and malaise, with negative workup. No significant event on telemetry. It was suspected episode of syncope last month was vasovagal response. Neuro checks were unremarkable. Normal TSH and normal ejection fraction without valvular abnormality on echocardiogram.  Upon taking history, difficult for me to assess whether she is on any new medication.   Past Medical History:  Diagnosis Date  . Alcohol abuse   . Alzheimer's disease 05/23/2010   ?  . ANXIETY  DEPRESSION 05/23/2010  . Arthritis   . DEMENTIA 07/20/2008  . Depression    monopolar  . GERD 05/11/2007   ?  Marland Kitchen Hyperlipidemia   . Kidney stone   . Memory loss   . Migraine    pt denies  . OSTEOARTHRITIS, KNEE 05/23/2010  . Osteopenia   . OSTEOPOROSIS 12/17/2007  . Wrist pain, right 04/17/2011    Patient Active Problem List   Diagnosis Date Noted  . Syncope 10/11/2016  . Syncopal episodes 06/02/2013  . Hyperlipidemia 04/17/2011  . Routine general medical examination at a health care facility 04/17/2011  . Major depression in remission (HCC) 05/23/2010  . Alzheimer's disease 05/23/2010  . OSTEOARTHRITIS, KNEE 05/23/2010  . Osteoporosis 12/17/2007    Past Surgical History:  Procedure Laterality Date  . DILATION AND CURETTAGE OF UTERUS     forty years ago  . TONSILLECTOMY    . TOTAL KNEE ARTHROPLASTY Right     OB History    No data available       Home Medications    Prior to Admission medications   Medication Sig Start Date End Date Taking? Authorizing Provider  aspirin 81 MG tablet Take 81 mg by mouth daily.    Historical Provider, MD  atorvastatin (LIPITOR) 40 MG tablet TAKE 1 TABLET(40 MG) BY MOUTH DAILY 10/28/16   Myrlene Broker, MD  Cholecalciferol (VITAMIN D3) 1000 UNITS CAPS Take 1 capsule by mouth daily.      Historical Provider, MD  citalopram (CELEXA) 20 MG tablet Take 1 tablet (20 mg total) by mouth daily. 10/30/15   Myrlene Broker, MD  dexlansoprazole (DEXILANT) 60 MG capsule Take 60 mg by mouth daily with supper.    Historical Provider, MD  donepezil (ARICEPT) 10 MG tablet Take 1 tablet (10 mg total) by mouth at bedtime. 09/25/16   Porfirio Mylar Dohmeier, MD  loperamide (IMODIUM) 1 MG/5ML solution Take 5 mLs (1 mg total) by mouth every 12 (twelve) hours as needed for diarrhea or loose stools. 10/31/16 11/03/16  Lajean Manes, MD  memantine (NAMENDA XR) 28 MG CP24 24 hr capsule Take 1 capsule (28 mg total) by mouth daily. 09/25/16   Melvyn Novas, MD     Family History Family History  Problem Relation Age of Onset  . Anemia Mother   . Alzheimer's disease Mother   . Emphysema Mother     smoker  . Obesity Sister   . Emphysema Sister     smoker  . Coronary artery disease Brother     smoker  . Heart attack Brother   . Obesity Daughter   . Depression Daughter   . Alcohol abuse Son   . Depression Son   . Suicidality Son     10/12-commited suicide in a manic depressive episode    Social History Social History  Substance Use Topics  . Smoking status: Never Smoker  . Smokeless tobacco: Never Used  . Alcohol use Yes     Comment: quit- had a problem w/ alcohol and has abstained for years     Allergies   Poison ivy extract   Review of Systems Review of Systems  All other systems reviewed and are negative. See also history of present illness above   Physical Exam Triage Vital Signs ED Triage Vitals  Enc Vitals Group     BP 10/31/16 1212 126/78     Pulse Rate 10/31/16 1212 60     Resp --      Temp 10/31/16 1212 97.6 F (36.4 C)     Temp Source 10/31/16 1212 Oral     SpO2 10/31/16 1212 98 %     Weight 10/31/16 1213 130 lb (59 kg)     Height 10/31/16 1213 5\' 2"  (1.575 m)     Head Circumference --      Peak Flow --      Pain Score 10/31/16 1214 0     Pain Loc --      Pain Edu? --      Excl. in GC? --    No data found.   Updated Vital Signs BP 126/78 (BP Location: Left Arm)   Pulse 60   Temp 97.6 F (36.4 C) (Oral)   Ht 5\' 2"  (1.575 m)   Wt 130 lb (59 kg)   SpO2 98%   BMI 23.78 kg/m    Physical Exam  Constitutional: She is oriented to person, place, and time. She appears well-developed and well-nourished. No distress.  HENT:  Head: Normocephalic and atraumatic.  Mouth/Throat: Oropharynx is clear and moist.  Eyes: Conjunctivae are normal. Pupils are equal, round, and reactive to light. No scleral icterus.  Neck: Normal range of motion. Neck supple. No JVD present.  Cardiovascular: Normal rate,  regular rhythm and normal heart sounds.   Pulmonary/Chest: Effort normal and breath sounds normal.  Abdominal: Soft. Bowel sounds are normal. She exhibits no distension and no mass. There is no tenderness. There is no rebound and no guarding.  Musculoskeletal: Normal range of motion. She exhibits no edema or tenderness.  Lymphadenopathy:    She has no cervical adenopathy.  Neurological: She is alert  and oriented to person, place, and time. No cranial nerve deficit.  No focal motor deficit  Skin: Skin is warm and dry. No rash noted. She is not diaphoretic.  Psychiatric: She has a normal mood and affect. Her behavior is normal.  She is alert and pleasant and cooperative. She is forgetful.  Vitals reviewed.    UC Treatments / Results  Labs (all labs ordered are listed, but only abnormal results are displayed) Labs Reviewed  POCT URINALYSIS DIP (MANUAL ENTRY) - Abnormal; Notable for the following:       Result Value   Blood, UA small (*)    All other components within normal limits  URINE CULTURE  GASTROINTESTINAL PANEL BY PCR, STOOL (REPLACES STOOL CULTURE)  C DIFFICILE QUICK SCREEN W PCR REFLEX  COMPLETE METABOLIC PANEL WITH GFR  POCT CBC W AUTO DIFF ([facility])    EKG  EKG Interpretation None       Radiology No results found.  Procedures Procedures (including critical care time)  Medications Ordered in UC Medications - No data to display   Initial Impression / Assessment and Plan / UC Course  I have reviewed the triage vital signs and the nursing notes.  Pertinent labs & imaging results that were available during my care of the patient were reviewed by me and considered in my medical decision making (see chart for details).  Clinical Course as of Oct 31 1329  Thu Oct 31, 2016  1302 Urinalysis negative except small RBC.-We'll send off urine culture. CBC within normal limits, WBC 4.6, hemoglobin 13.1  [DM]    Clinical Course User Index [DM] Lajean Manesavid  Massey, MD      Final Clinical Impressions(s) / UC Diagnoses   Final diagnoses:  Diarrhea, unspecified type  Malaise and fatigue   CBC normal. UA small blood, was negative. Sending off urine culture. Send off CMP. Gave containers to obtain stool and instructions at length, to return stool sample for GI panel and C. difficile test by PCR.  It's difficult to tell the significance of diarrhea by history, but she does not appear toxic, has no evidence of infection, no fever, and vital signs are stable, no orthostatic hypotension. No evidence of acute abdomen. It's possible that symptoms could be related to dementia, but I cannot make that diagnosis for certain. To my knowledge, not on any new medication except Pepto-Bismol, so I advised to stop Pepto-Bismol.  I explained to patient and husband that I don't have a definite diagnosis for her history of diarrhea. I did prescribe a low dose of loperamide after risks benefits alternatives discussed. Precautions discussed. Other advice given. Husband is only caretaker of his wife, and I made the suggestion to consider having extra help in the house, but he declined that advice. Advised to discuss this with PCP.  An After Visit Summary was printed and given to the patient and husband.  I urged them to follow up with PCP within 3 days. See AVS for other details. Questions invited and answered. Husband voiced understanding and agreement with plans   New Prescriptions New Prescriptions   LOPERAMIDE (IMODIUM) 1 MG/5ML SOLUTION    Take 5 mLs (1 mg total) by mouth every 12 (twelve) hours as needed for diarrhea or loose stools.     Lajean Manesavid Massey, MD 10/31/16 [phone]

## 2016-10-31 NOTE — ED Triage Notes (Signed)
Diarrhea intermittently for 2 weeks, steadily for 4 days. Fatigue.

## 2016-10-31 NOTE — Discharge Instructions (Signed)
Today, urine test shows trace of blood, uncertain significance. We are sending off a urine culture to rule out urinary tract infection.   Complete blood count including white blood cell count and red blood cell count were normal. We are sending off blood tests for chemistries including liver tests in salt balance tests. Obtain stool samples at home to bring in and we will send to lab. Stop taking Pepto-Bismol New prescriptions sent in to pharmacy for Imodium liquid. This is a low-dose, but may help diarrhea. Follow-up with your primary care doctor within 3 days.- (Emergency room if any severe worsening symptoms)

## 2016-11-01 ENCOUNTER — Other Ambulatory Visit: Payer: Self-pay | Admitting: Emergency Medicine

## 2016-11-01 ENCOUNTER — Telehealth: Payer: Self-pay | Admitting: Emergency Medicine

## 2016-11-01 DIAGNOSIS — R197 Diarrhea, unspecified: Secondary | ICD-10-CM | POA: Diagnosis not present

## 2016-11-02 ENCOUNTER — Telehealth: Payer: Self-pay | Admitting: Emergency Medicine

## 2016-11-02 LAB — CLOSTRIDIUM DIFFICILE BY PCR: Toxigenic C. Difficile by PCR: NOT DETECTED

## 2016-11-02 LAB — URINE CULTURE

## 2016-11-02 NOTE — Telephone Encounter (Signed)
Spoke w/pt's husband, states that pt is still not feeling well, taking some Imodium to see if things settle down.  Informed that her urine culture was negative.  Pt is planning on following up with PCP.  TMartin,CMA

## 2016-11-04 LAB — GASTROINTESTINAL PATHOGEN PANEL PCR
C. difficile Tox A/B, PCR: NOT DETECTED
Campylobacter, PCR: NOT DETECTED
Cryptosporidium, PCR: NOT DETECTED
E coli (ETEC) LT/ST PCR: NOT DETECTED
E coli (STEC) stx1/stx2, PCR: NOT DETECTED
E coli 0157, PCR: NOT DETECTED
Giardia lamblia, PCR: NOT DETECTED
Norovirus, PCR: NOT DETECTED
Rotavirus A, PCR: NOT DETECTED
Salmonella, PCR: NOT DETECTED
Shigella, PCR: NOT DETECTED

## 2016-11-05 ENCOUNTER — Telehealth: Payer: Self-pay | Admitting: *Deleted

## 2016-11-05 NOTE — Telephone Encounter (Signed)
Spoke to pt's husband given stool culture and c diff results. He reports that Kim Hernandez is feeling some better, but still weak. He can not get a f/u appt with her PCP. Info given for PCP in our building. he will call them to schedule an appt.

## 2016-11-06 ENCOUNTER — Ambulatory Visit (INDEPENDENT_AMBULATORY_CARE_PROVIDER_SITE_OTHER): Payer: Medicare Other | Admitting: Gastroenterology

## 2016-11-06 ENCOUNTER — Encounter: Payer: Self-pay | Admitting: Gastroenterology

## 2016-11-06 ENCOUNTER — Other Ambulatory Visit: Payer: Self-pay

## 2016-11-06 ENCOUNTER — Other Ambulatory Visit (INDEPENDENT_AMBULATORY_CARE_PROVIDER_SITE_OTHER): Payer: Medicare Other

## 2016-11-06 VITALS — BP 106/70 | HR 72 | Resp 16 | Ht 62.0 in | Wt 130.0 lb

## 2016-11-06 DIAGNOSIS — R197 Diarrhea, unspecified: Secondary | ICD-10-CM

## 2016-11-06 DIAGNOSIS — R531 Weakness: Secondary | ICD-10-CM | POA: Diagnosis not present

## 2016-11-06 DIAGNOSIS — E876 Hypokalemia: Secondary | ICD-10-CM

## 2016-11-06 LAB — BASIC METABOLIC PANEL
BUN: 18 mg/dL (ref 6–23)
CALCIUM: 9.3 mg/dL (ref 8.4–10.5)
CHLORIDE: 103 meq/L (ref 96–112)
CO2: 32 meq/L (ref 19–32)
CREATININE: 0.76 mg/dL (ref 0.40–1.20)
GFR: 78.53 mL/min (ref >60.00)
GLUCOSE: 99 mg/dL (ref 70–99)
Potassium: 3 mEq/L — ABNORMAL LOW (ref 3.5–5.1)
Sodium: 143 mEq/L (ref 135–145)

## 2016-11-06 NOTE — Patient Instructions (Signed)
If you are age 77 or older, your body mass index should be between 23-30. Your Body mass index is 23.78 kg/m. If this is out of the aforementioned range listed, please consider follow up with your Primary Care Provider.  If you are age 77 or younger, your body mass index should be between 19-25. Your Body mass index is 23.78 kg/m. If this is out of the aformentioned range listed, please consider follow up with your Primary Care Provider.   Your physician has requested that you go to the basement for the following lab work before leaving today:  BMET  Please purchase Immodium over the counter. Take two tablets every morning. You may decrease dosage as needed.  Please call Dr. Adela LankArmbruster if your symptoms do not improve.  Thank you.

## 2016-11-06 NOTE — Progress Notes (Signed)
HPI :  75 y/o female, former patient of Dr. Arlyce Dice last seen on 05/13/14, here for a follow up visit. She has a history of dementia, depression.   She reports diarrhea bothering her, they think since the weekend of Feb 25th, slightly less than a month or so in duration. Husband reports 3-4 BMs per day, has been up to 5-6 at times per day at worse. Stools are loose and watery. She has had a few episodes of fecal incontinence due to urgency. No abdominal pains. No nausea or vomiting. She has been seen by urgent care for this issue and had negative stool testing as below. She has been on celexa for a long time. Otherwise no new medications to her regimen other than florinef per husband.  She took some low dose immodium in the past week, 1mg  liquid, perhaps twice per day total. She thinks this did help her to some extent   C diff testing and GI pathogen panel both negative on 11/01/16  CBC on 3/16 - Hgb 13.1, WBC 4.6, TSH normal  Colonoscopy 2011 normal  Past Medical History:  Diagnosis Date  . Alcohol abuse   . Alzheimer's disease 05/23/2010   ?  . ANXIETY DEPRESSION 05/23/2010  . Arthritis   . DEMENTIA 07/20/2008  . Depression    monopolar  . GERD 05/11/2007   ?  Marland Kitchen Hyperlipidemia   . Kidney stone   . Memory loss   . Migraine    pt denies  . OSTEOARTHRITIS, KNEE 05/23/2010  . Osteopenia   . OSTEOPOROSIS 12/17/2007  . Wrist pain, right 04/17/2011     Past Surgical History:  Procedure Laterality Date  . DILATION AND CURETTAGE OF UTERUS     forty years ago  . TONSILLECTOMY    . TOTAL KNEE ARTHROPLASTY Right    Family History  Problem Relation Age of Onset  . Anemia Mother   . Alzheimer's disease Mother   . Emphysema Mother     smoker  . Obesity Sister   . Emphysema Sister     smoker  . Coronary artery disease Brother     smoker  . Heart attack Brother   . Obesity Daughter   . Depression Daughter   . Alcohol abuse Son   . Depression Son   . Suicidality Son    10/12-commited suicide in a manic depressive episode   Social History  Substance Use Topics  . Smoking status: Never Smoker  . Smokeless tobacco: Never Used  . Alcohol use Yes     Comment: quit- had a problem w/ alcohol and has abstained for years   Current Outpatient Prescriptions  Medication Sig Dispense Refill  . aspirin 81 MG tablet Take 81 mg by mouth daily.    Marland Kitchen atorvastatin (LIPITOR) 40 MG tablet TAKE 1 TABLET(40 MG) BY MOUTH DAILY 90 tablet 0  . Cholecalciferol (VITAMIN D3) 1000 UNITS CAPS Take 1 capsule by mouth daily.      . citalopram (CELEXA) 20 MG tablet Take 1 tablet (20 mg total) by mouth daily. 90 tablet 3  . dexlansoprazole (DEXILANT) 60 MG capsule Take 60 mg by mouth daily with supper.    . donepezil (ARICEPT) 10 MG tablet Take 1 tablet (10 mg total) by mouth at bedtime. 30 tablet 3  . fludrocortisone (FLORINEF) 0.1 MG tablet TK 1 T PO D  5  . memantine (NAMENDA XR) 28 MG CP24 24 hr capsule Take 1 capsule (28 mg total) by mouth daily. 90 capsule  0   No current facility-administered medications for this visit.    Allergies  Allergen Reactions  . Poison Ivy Extract Rash     Review of Systems: All systems reviewed and negative except where noted in HPI.    Dg Chest 2 View  Result Date: 10/11/2016 CLINICAL DATA:  Syncope last night. EXAM: CHEST  2 VIEW COMPARISON:  06/02/2013 FINDINGS: The heart size and mediastinal contours are within normal limits. Both lungs are clear. The visualized skeletal structures are unremarkable. Aortic atherosclerosis. IMPRESSION: No active cardiopulmonary disease. Electronically Signed   By: Francene BoyersJames  Maxwell M.D.   On: 10/11/2016 16:07    Physical Exam: BP 106/70   Pulse 72   Resp 16   Ht 5\' 2"  (1.575 m)   Wt 130 lb (59 kg)   BMI 23.78 kg/m  Constitutional: Pleasant,well-developed, female in no acute distress. HEENT: Normocephalic and atraumatic. Conjunctivae are normal. No scleral icterus. Neck supple.  Cardiovascular: Normal  rate, regular rhythm.  Pulmonary/chest: Effort normal and breath sounds normal. No wheezing, rales or rhonchi. Abdominal: Soft, nondistended, nontender. There are no masses palpable. No hepatomegaly. Extremities: no edema Lymphadenopathy: No cervical adenopathy noted. Neurological: Alert and oriented to person place and time. Skin: Skin is warm and dry. No rashes noted. Psychiatric: Normal mood and affect. Behavior is normal.   ASSESSMENT AND PLAN: 76 year old female, new patient to me, here for assessment for acute diarrhea (duration < 30 days thus far). We discussed potential etiologies. She has no history of diarrhea prior to this. She could have postinfectious diarrhea (viral?), while microscopic colitis also remains possible. She did have some benefit with a very low-dose of Imodium, at this point I would try tablet form Imodium at higher dose of 2 mg tablet, 2 tabs in the morning, and additional tab as needed for breakthrough. With her history of dementia, at higher doses of Imodium this could cause confusion, however she seemed to tolerate the low-dose just fine. Husband will be mindful of potential side effects on Imodium, but I think it will help her. She will try this for the next week or so, if her symptoms improve husband was counseled how to taper down Imodium. If her symptoms persist despite Imodium, recommend colonoscopy to rule out microscopic colitis. Husband will contact me in a week or so given update on her condition. Otherwise we'll check electrolytes today in light of her symptoms to ensure normal given her fatigue. She will continue using Pedialyte or Gatorade to ensure she maintains hydration . All questions answered.  Ileene PatrickSteven Aliviya Schoeller, MD [facility] Pager [phone]

## 2016-11-11 ENCOUNTER — Telehealth: Payer: Self-pay | Admitting: Gastroenterology

## 2016-11-11 NOTE — Telephone Encounter (Signed)
Spoke to patient's husband, he is concerned that she is very weak from the diarrhea. He said that he has been giving her the imodium and for last 3 days, she has not had any diarrhea. She is continuing to drink the Gatorade to help replenish electrolytes. She is eating also. He thinks she is somewhat better, but still weak. I told him that if he thought she was worse than he needed to take her to ED, urgent care or PCP, that I did not want him to wait. He just thought that she should be back more to normal by now. Explained that it can take awhile for her to get her strength back. He will bring her in for the blood work on Wednesday, understands if her symptoms worsen to seek medical help immediately.

## 2016-11-11 NOTE — Telephone Encounter (Signed)
Glad to hear the diarrhea has resolved with immodium, she can continue this as needed. We will repeat her blood work to recheck electrolytes but suspect they will be improved if her diarrhea resolved. If symptoms of fatigue persist and her diarrhea has resolved, she should follow up with her primary care to discuss other reasons for her fatigue. Her recent blood work was otherwise normal, not sure if she is having insomnia or sleep issues which could contribute. thanks

## 2016-11-13 ENCOUNTER — Emergency Department (HOSPITAL_COMMUNITY): Payer: Medicare Other

## 2016-11-13 ENCOUNTER — Encounter (HOSPITAL_COMMUNITY): Payer: Self-pay | Admitting: Emergency Medicine

## 2016-11-13 ENCOUNTER — Telehealth: Payer: Self-pay

## 2016-11-13 ENCOUNTER — Observation Stay (HOSPITAL_COMMUNITY)
Admission: EM | Admit: 2016-11-13 | Discharge: 2016-11-14 | Disposition: A | Discharge status: Home / Self Care | Payer: Medicare Other | Attending: Internal Medicine | Admitting: Internal Medicine

## 2016-11-13 DIAGNOSIS — R410 Disorientation, unspecified: Secondary | ICD-10-CM | POA: Diagnosis not present

## 2016-11-13 DIAGNOSIS — I951 Orthostatic hypotension: Secondary | ICD-10-CM | POA: Insufficient documentation

## 2016-11-13 DIAGNOSIS — Z79899 Other long term (current) drug therapy: Secondary | ICD-10-CM | POA: Insufficient documentation

## 2016-11-13 DIAGNOSIS — Z7982 Long term (current) use of aspirin: Secondary | ICD-10-CM | POA: Insufficient documentation

## 2016-11-13 DIAGNOSIS — F0280 Dementia in other diseases classified elsewhere without behavioral disturbance: Secondary | ICD-10-CM | POA: Insufficient documentation

## 2016-11-13 DIAGNOSIS — E784 Other hyperlipidemia: Secondary | ICD-10-CM | POA: Diagnosis not present

## 2016-11-13 DIAGNOSIS — Z7952 Long term (current) use of systemic steroids: Secondary | ICD-10-CM | POA: Insufficient documentation

## 2016-11-13 DIAGNOSIS — E785 Hyperlipidemia, unspecified: Secondary | ICD-10-CM | POA: Diagnosis not present

## 2016-11-13 DIAGNOSIS — F325 Major depressive disorder, single episode, in full remission: Secondary | ICD-10-CM | POA: Insufficient documentation

## 2016-11-13 DIAGNOSIS — R2681 Unsteadiness on feet: Secondary | ICD-10-CM | POA: Diagnosis not present

## 2016-11-13 DIAGNOSIS — G301 Alzheimer's disease with late onset: Secondary | ICD-10-CM

## 2016-11-13 DIAGNOSIS — G308 Other Alzheimer's disease: Secondary | ICD-10-CM | POA: Diagnosis not present

## 2016-11-13 DIAGNOSIS — R4701 Aphasia: Secondary | ICD-10-CM | POA: Diagnosis not present

## 2016-11-13 DIAGNOSIS — R531 Weakness: Secondary | ICD-10-CM | POA: Diagnosis not present

## 2016-11-13 DIAGNOSIS — G309 Alzheimer's disease, unspecified: Secondary | ICD-10-CM | POA: Diagnosis not present

## 2016-11-13 DIAGNOSIS — F02818 Dementia in other diseases classified elsewhere, unspecified severity, with other behavioral disturbance: Secondary | ICD-10-CM | POA: Diagnosis present

## 2016-11-13 DIAGNOSIS — R2689 Other abnormalities of gait and mobility: Secondary | ICD-10-CM | POA: Diagnosis not present

## 2016-11-13 DIAGNOSIS — F0281 Dementia in other diseases classified elsewhere with behavioral disturbance: Secondary | ICD-10-CM | POA: Diagnosis present

## 2016-11-13 LAB — CBC
HEMATOCRIT: 40.9 % (ref 36.0–46.0)
HEMOGLOBIN: 13.3 g/dL (ref 12.0–15.0)
MCH: 31.4 pg (ref 26.0–34.0)
MCHC: 32.5 g/dL (ref 30.0–36.0)
MCV: 96.5 fL (ref 78.0–100.0)
PLATELETS: 260 10*3/uL (ref 150–400)
RBC: 4.24 MIL/uL (ref 3.87–5.11)
RDW: 12.4 % (ref 11.5–15.5)
WBC: 8.6 10*3/uL (ref 4.0–10.5)

## 2016-11-13 LAB — URINALYSIS, ROUTINE W REFLEX MICROSCOPIC
BILIRUBIN URINE: NEGATIVE
Glucose, UA: NEGATIVE mg/dL
Hgb urine dipstick: NEGATIVE
KETONES UR: NEGATIVE mg/dL
Leukocytes, UA: NEGATIVE
NITRITE: NEGATIVE
PH: 6 (ref 5.0–8.0)
PROTEIN: NEGATIVE mg/dL
Specific Gravity, Urine: 1.015 (ref 1.005–1.030)

## 2016-11-13 LAB — BASIC METABOLIC PANEL
ANION GAP: 11 (ref 5–15)
BUN: 11 mg/dL (ref 6–20)
CO2: 24 mmol/L (ref 22–32)
Calcium: 9.4 mg/dL (ref 8.9–10.3)
Chloride: 104 mmol/L (ref 101–111)
Creatinine, Ser: 0.76 mg/dL (ref 0.44–1.00)
GLUCOSE: 92 mg/dL (ref 65–99)
POTASSIUM: 3.9 mmol/L (ref 3.5–5.1)
SODIUM: 139 mmol/L (ref 135–145)

## 2016-11-13 LAB — RAPID URINE DRUG SCREEN, HOSP PERFORMED
AMPHETAMINES: NOT DETECTED
BARBITURATES: NOT DETECTED
BENZODIAZEPINES: NOT DETECTED
Cocaine: NOT DETECTED
Opiates: NOT DETECTED
Tetrahydrocannabinol: NOT DETECTED

## 2016-11-13 LAB — I-STAT TROPONIN, ED: TROPONIN I, POC: 0 ng/mL (ref 0.00–0.08)

## 2016-11-13 LAB — ETHANOL

## 2016-11-13 LAB — TSH: TSH: 1.784 u[IU]/mL (ref 0.350–4.500)

## 2016-11-13 LAB — CBG MONITORING, ED: Glucose-Capillary: 102 mg/dL — ABNORMAL HIGH (ref 65–99)

## 2016-11-13 MED ORDER — VITAMIN D 1000 UNITS PO TABS
1000.0000 [IU] | ORAL_TABLET | Freq: Every day | ORAL | Status: DC
  Administered 2016-11-13 – 2016-11-14 (×2): 1000 [IU] via ORAL
  Filled 2016-11-13 (×2): qty 1

## 2016-11-13 MED ORDER — ATORVASTATIN CALCIUM 40 MG PO TABS
40.0000 mg | ORAL_TABLET | Freq: Every day | ORAL | Status: DC
  Administered 2016-11-13 – 2016-11-14 (×2): 40 mg via ORAL
  Filled 2016-11-13 (×3): qty 1

## 2016-11-13 MED ORDER — FLUDROCORTISONE ACETATE 0.1 MG PO TABS
0.1000 mg | ORAL_TABLET | Freq: Every day | ORAL | Status: DC
  Administered 2016-11-14: 0.1 mg via ORAL
  Filled 2016-11-13: qty 1

## 2016-11-13 MED ORDER — VITAMIN D3 25 MCG (1000 UT) PO CAPS: 1.0000 | ORAL_CAPSULE | Freq: Every day | ORAL | Status: DC

## 2016-11-13 MED ORDER — ASPIRIN 81 MG PO CHEW
81.0000 mg | CHEWABLE_TABLET | Freq: Every day | ORAL | Status: DC
  Administered 2016-11-13 – 2016-11-14 (×2): 81 mg via ORAL
  Filled 2016-11-13 (×2): qty 1

## 2016-11-13 MED ORDER — SODIUM CHLORIDE 0.9 % IV SOLN
INTRAVENOUS | Status: DC
  Administered 2016-11-13: 21:00:00 via INTRAVENOUS

## 2016-11-13 MED ORDER — CITALOPRAM HYDROBROMIDE 10 MG PO TABS
20.0000 mg | ORAL_TABLET | Freq: Every day | ORAL | Status: DC
  Administered 2016-11-13 – 2016-11-14 (×2): 20 mg via ORAL
  Filled 2016-11-13 (×2): qty 2

## 2016-11-13 MED ORDER — ASPIRIN 81 MG PO TABS: 81.0000 mg | ORAL_TABLET | Freq: Every day | ORAL | Status: DC

## 2016-11-13 NOTE — Progress Notes (Signed)
Patient arrived to unit alert and oriented to person, place, time, disoriented to situation. Patient short term memory impaired. Current treatment plan and safety plan reviewed with patient. Patient v/u but requires continual reorientation. RN will continue to monitor patient.

## 2016-11-13 NOTE — ED Notes (Signed)
Report attempted 

## 2016-11-13 NOTE — Telephone Encounter (Signed)
Sorry to hear this, agree she should go to the emergency room for immediate evaluation.

## 2016-11-13 NOTE — ED Notes (Signed)
Pt transported to radiology.

## 2016-11-13 NOTE — ED Notes (Signed)
Report attempted by Johnston EbbsKoula RN at [phone]

## 2016-11-13 NOTE — ED Notes (Signed)
Ordered pt a regular diet tray 

## 2016-11-13 NOTE — ED Triage Notes (Signed)
Pt arrives with husband from home with a c/o of weakness that has progressively gotten worse over the last 2 weeks. Husband states he noticed she has been having trouble forming words over the last 3 days. Pt is is alert & oriented  to person and place, disoriented to time. Husband report hx of dementia and lately pt has became more confused at times.

## 2016-11-13 NOTE — ED Notes (Signed)
Attempted report x 2 

## 2016-11-13 NOTE — ED Notes (Signed)
Neurology at bedside.

## 2016-11-13 NOTE — ED Notes (Signed)
EKG completed given to EDP.  

## 2016-11-13 NOTE — Consult Note (Signed)
NEURO HOSPITALIST CONSULT NOTE   Requestig physician: Dr. Clarene DukeLittle   Reason for Consult: Recent worsening of gait stability and increased weakness generally.   History obtained from:  Patient   and husband  HPI:                                                                                                                                          Kim Hernandez is an 76 y.o. female is followed closely by Kim Hernandez of [facility] for her cognitive decline. Per husband over the past few weeks she's noticed that her gait has become more unstable where she is needed or assisted device more often. He's fearful that she is going to fall. He is also knows that she is just generally weak. He does admit that he is noted that her writing has become more micrographic and she is shuffling more than usual. When asked if she ever appears to be frozen or having difficulty getting started initially said he did not note this but afterward she said now that you say that there is a possibility that she does do that. He also states that he's noticed some tremors the main tremor that he noted was at sleep and it was unilateral. With her hand. Currently she is having no tremors. Due to the fact that the husband felt that she might be having a stroke that she [facility] without making a actual appointment with Dr. Vickey Hernandez.    Past Medical History:  Diagnosis Date  . Alcohol abuse   . Alzheimer's disease 05/23/2010   ?  . ANXIETY DEPRESSION 05/23/2010  . Arthritis   . DEMENTIA 07/20/2008  . Depression    monopolar  . GERD 05/11/2007   ?  Marland Kitchen. Hyperlipidemia   . Kidney stone   . Memory loss   . Migraine    pt denies  . OSTEOARTHRITIS, KNEE 05/23/2010  . Osteopenia   . OSTEOPOROSIS 12/17/2007  . Wrist pain, right 04/17/2011    Past Surgical History:  Procedure Laterality Date  . DILATION AND CURETTAGE OF UTERUS     forty years ago  . TONSILLECTOMY    .  TOTAL KNEE ARTHROPLASTY Right     Family History  Problem Relation Age of Onset  . Anemia Mother   . Alzheimer's disease Mother   . Emphysema Mother     smoker  . Obesity Sister   . Emphysema Sister     smoker  . Coronary artery disease Brother     smoker  . Heart attack Brother   . Obesity Daughter   . Depression Daughter   . Alcohol abuse Son   . Depression Son   . Suicidality Son     10/12-commited suicide in a  manic depressive episode      Social History:  reports that she has never smoked. She has never used smokeless tobacco. She reports that she drinks alcohol. She reports that she does not use drugs.  Allergies  Allergen Reactions  . Poison Ivy Extract Rash    MEDICATIONS:                                                                                                                     No current facility-administered medications for this encounter.    Current Outpatient Prescriptions  Medication Sig Dispense Refill  . aspirin 81 MG tablet Take 81 mg by mouth daily.    Marland Kitchen atorvastatin (LIPITOR) 40 MG tablet TAKE 1 TABLET(40 MG) BY MOUTH DAILY 90 tablet 0  . Cholecalciferol (VITAMIN D3) 1000 UNITS CAPS Take 1 capsule by mouth daily.      . citalopram (CELEXA) 20 MG tablet Take 1 tablet (20 mg total) by mouth daily. 90 tablet 3  . dexlansoprazole (DEXILANT) 60 MG capsule Take 60 mg by mouth daily with supper.    . donepezil (ARICEPT) 10 MG tablet Take 1 tablet (10 mg total) by mouth at bedtime. 30 tablet 3  . fludrocortisone (FLORINEF) 0.1 MG tablet TK 1 T PO D  5  . memantine (NAMENDA XR) 28 MG CP24 24 hr capsule Take 1 capsule (28 mg total) by mouth daily. 90 capsule 0      ROS:                                                                                                                                       History obtained from the patient and Husband  General ROS: negative for - chills, fatigue, fever, night sweats, weight gain or weight  loss Psychological ROS: negative for - behavioral disorder, hallucinations, memory difficulties, mood swings or suicidal ideation Ophthalmic ROS: negative for - blurry vision, double vision, eye pain or loss of vision ENT ROS: negative for - epistaxis, nasal discharge, oral lesions, sore throat, tinnitus or vertigo Allergy and Immunology ROS: negative for - hives or itchy/watery eyes Hematological and Lymphatic ROS: negative for - bleeding problems, bruising or swollen lymph nodes Endocrine ROS: negative for - galactorrhea, hair pattern changes, polydipsia/polyuria or temperature intolerance Respiratory ROS: negative for - cough, hemoptysis, shortness of breath or wheezing Cardiovascular ROS: negative for - chest pain, dyspnea on exertion, edema or irregular  heartbeat Gastrointestinal ROS: negative for - abdominal pain, diarrhea, hematemesis, nausea/vomiting or stool incontinence Genito-Urinary ROS: negative for - dysuria, hematuria, incontinence or urinary frequency/urgency Musculoskeletal ROS: Positive for -  muscular weakness Neurological ROS: as noted in HPI Dermatological ROS: negative for rash and skin lesion changes   Blood pressure 115/69, pulse 63, temperature 98 F (36.7 C), temperature source Oral, resp. rate 16, height 5\' 2"  (1.575 m), weight 59 kg (130 lb), SpO2 100 %.   Neurologic Examination:                                                                                                      HEENT-  Normocephalic, no lesions, without obvious abnormality.  Normal external eye and conjunctiva.  Normal TM's bilaterally.  Normal auditory canals and external ears. Normal external nose, mucus membranes and septum.  Normal pharynx. Cardiovascular- S1, S2 normal, pulses palpable throughout   Lungs- chest clear, no wheezing, rales, normal symmetric air entry Abdomen- normal findings: bowel sounds normal Extremities- no edema Lymph-no adenopathy palpable Musculoskeletal-no joint  tenderness, deformity or swelling Skin-warm and dry, no hyperpigmentation, vitiligo, or suspicious lesions  Neurological Examination Mental Status: Alert, oriented to Hospital however got the date, month wrong. She has known Alzheimer's dementia per Dr. Vickey Huger.  Speech fluent without evidence of aphasia.  Able to follow 3 step commands without difficulty. Cranial Nerves: II:  Visual fields grossly normal, pupils equal, round, reactive to light and accommodation III,IV, VI: ptosis not present, extra-ocular motions intact bilaterally V,VII: smile symmetric, facial light touch sensation normal bilaterally VIII: hearing normal bilaterally IX,X: uvula rises symmetrically XI: bilateral shoulder shrug XII: midline tongue extension Motor: Right : Upper extremity   5/5    Left:     Upper extremity   5/5  Lower extremity   5/5     Lower extremity   5/5 --When having patient do writing tasks such as drawing a spiral or writing out sentences it is noted that she does have a significant tremor with this and she also has micrographia  Tone and bulk:normal tone throughout; no atrophy noted Sensory: Pinprick and light touch intact throughout, bilaterally Deep Tendon Reflexes: 2+ and symmetric throughout Plantars: Right: downgoing   Left: downgoing Cerebellar: normal finger-to-nose, normal heel-to-shin test Gait:On gait assessment patient does not shuffle however she was unstable with her gait and at times had to hold on to things on either side.      Lab Results: Basic Metabolic Panel:  Recent Labs Lab 11/13/16 0945  NA 139  K 3.9  CL 104  CO2 24  GLUCOSE 92  BUN 11  CREATININE 0.76  CALCIUM 9.4    Liver Function Tests: No results for input(s): AST, ALT, ALKPHOS, BILITOT, PROT, ALBUMIN in the last 168 hours. No results for input(s): LIPASE, AMYLASE in the last 168 hours. No results for input(s): AMMONIA in the last 168 hours.  CBC:  Recent Labs Lab 11/13/16 0945  WBC 8.6   HGB 13.3  HCT 40.9  MCV 96.5  PLT 260    Cardiac Enzymes: No results for  input(s): CKTOTAL, CKMB, CKMBINDEX, TROPONINI in the last 168 hours.  Lipid Panel: No results for input(s): CHOL, TRIG, HDL, CHOLHDL, VLDL, LDLCALC in the last 168 hours.  CBG:  Recent Labs Lab 11/13/16 1220  GLUCAP 102*    Microbiology: Results for orders placed or performed in visit on 11/01/16  Clostridium Difficile by PCR     Status: None   Collection Time: 11/01/16  3:59 PM  Result Value Ref Range Status   Toxigenic C Difficile by pcr Not Detected Not Detected Final    Comment: This test is for use only with liquid or soft stools; performance characteristics of other clinical specimen types have not been established.   This assay was performed by Cepheid GeneXpert(R) PCR. The performance characteristics of this assay have been determined by [employer]. Performance characteristics refer to the analytical performance of the test.     Coagulation Studies: No results for input(s): LABPROT, INR in the last 72 hours.  Imaging: Dg Chest 2 View  Result Date: 11/13/2016 CLINICAL DATA:  Confusion and dysarthria EXAM: CHEST  2 VIEW COMPARISON:  October 11, 2016 FINDINGS: No edema or consolidation. The heart size and pulmonary vascularity are normal. No adenopathy. There is atherosclerotic calcification in the aorta. No bone lesions. IMPRESSION: No edema or consolidation.  There is aortic atherosclerosis. Electronically Signed   By: Bretta Bang III M.D.   On: 11/13/2016 12:05       Assessment and plan per attending neurologist  Felicie Morn PA-C [facility] [phone]  11/13/2016, 1:03 PM   Assessment/Plan: 76 year old female with new onset gait instability and cognitive decline. I suspect that this is likely multifactorial including deconditioning from her recent illness, cognitive impairment, possible presyncope. I would favor ruling out a stroke with MRI, but  my major concern would be safety at home.  1) MRI brain 2) if this is negative, then I think physical therapy would be the major intervention, if positive, then I would pursue stroke workup.  Ritta Slot, MD [facility] [phone]  If 7pm- 7am, please page neurology on call as listed in AMION.

## 2016-11-13 NOTE — Telephone Encounter (Signed)
Patient's husband called to let us know he is taking his wife to ED. She started having slurred speech, difficulty with muscle motor control. Advised him to call 911. He states that she does not like to go by ambulance and that he will take her to ED now.

## 2016-11-13 NOTE — ED Notes (Signed)
Patient returned from xray unable to have CT at this time per CT. Will send transporter when ready.

## 2016-11-13 NOTE — H&P (Addendum)
History and Physical    Kim Hernandez OZH:[id number] DOB: 03-17-1941 DOA: 11/13/2016  Referring MD/NP/PA: Dr. Clarene Duke   PCP: Gaspar Garbe, MD   Outpatient Specialists: Dr. Vickey Huger   Patient coming from: home   Chief Complaint: gait instability   HPI: Kim Hernandez is a 76 y.o. female with medical history significant for dementia, postural hypotension, dyslipidemia who presented to [facility] with reports of being weak and some speech problems, more confusion and trouble walking for past few days prior to this admissions. She needed help with ambulating because she would fall without assistance. No loss of consciousness. No chest pain, no shortness of breath or fevers. No cough. No urinary complaints.  ED Course: In ED, pt was hemodynamically stable. She had mild bradycardia with HR of 55. Her blood work was unremarkable. CT head showed no acute intracranial findings. Seen by neuro in consultation.  Review of Systems:  Constitutional: Negative for fever, chills, diaphoresis, activity change, appetite change and fatigue.  HENT: Negative for ear pain, nosebleeds, congestion, facial swelling, rhinorrhea, neck pain, neck stiffness and ear discharge.   Eyes: Negative for pain, discharge, redness, itching and visual disturbance.  Respiratory: Negative for cough, choking, chest tightness, shortness of breath, wheezing and stridor.   Cardiovascular: Negative for chest pain, palpitations and leg swelling.  Gastrointestinal: Negative for abdominal distention.  Genitourinary: Negative for dysuria, urgency, frequency, hematuria, flank pain, decreased urine volume, difficulty urinating and dyspareunia.  Musculoskeletal: Negative for back pain, joint swelling, arthralgias and gait problem.  Neurological: per HPI Hematological: Negative for adenopathy. Does not bruise/bleed easily.  Psychiatric/Behavioral: Negative for hallucinations, behavioral problems, confusion, dysphoric mood, decreased  concentration and agitation.   Past Medical History:  Diagnosis Date  . Alcohol abuse   . Alzheimer's disease 05/23/2010   ?  . ANXIETY DEPRESSION 05/23/2010  . Arthritis   . DEMENTIA 07/20/2008  . Depression    monopolar  . GERD 05/11/2007   ?  Marland Kitchen Hyperlipidemia   . Kidney stone   . Memory loss   . Migraine    pt denies  . OSTEOARTHRITIS, KNEE 05/23/2010  . Osteopenia   . OSTEOPOROSIS 12/17/2007  . Wrist pain, right 04/17/2011    Past Surgical History:  Procedure Laterality Date  . DILATION AND CURETTAGE OF UTERUS     forty years ago  . TONSILLECTOMY    . TOTAL KNEE ARTHROPLASTY Right     Social history:  reports that she has never smoked. She has never used smokeless tobacco. She reports that she drinks alcohol. She reports that she does not use drugs.  Ambulation usually ambulates without difficulty without assistance   Allergies  Allergen Reactions  . Poison Ivy Extract Rash    Family History  Problem Relation Age of Onset  . Anemia Mother   . Alzheimer's disease Mother   . Emphysema Mother     smoker  . Obesity Sister   . Emphysema Sister     smoker  . Coronary artery disease Brother     smoker  . Heart attack Brother   . Obesity Daughter   . Depression Daughter   . Alcohol abuse Son   . Depression Son   . Suicidality Son     10/12-commited suicide in a manic depressive episode    Prior to Admission medications   Medication Sig Start Date End Date Taking? Authorizing Provider  aspirin 81 MG tablet Take 81 mg by mouth daily.   Yes Historical Provider, MD  atorvastatin (LIPITOR)  40 MG tablet TAKE 1 TABLET(40 MG) BY MOUTH DAILY 10/28/16  Yes Myrlene BrokerElizabeth A Crawford, MD  Cholecalciferol (VITAMIN D3) 1000 UNITS CAPS Take 1 capsule by mouth daily.     Yes Historical Provider, MD  citalopram (CELEXA) 20 MG tablet Take 1 tablet (20 mg total) by mouth daily. 10/30/15  Yes Myrlene BrokerElizabeth A Crawford, MD  donepezil (ARICEPT) 10 MG tablet Take 1 tablet (10 mg total) by mouth  at bedtime. 09/25/16  Yes Porfirio Mylararmen Dohmeier, MD  fludrocortisone (FLORINEF) 0.1 MG tablet TK 1 T PO D 10/17/16  Yes Historical Provider, MD  memantine (NAMENDA XR) 28 MG CP24 24 hr capsule Take 1 capsule (28 mg total) by mouth daily. 09/25/16  Yes Melvyn Novasarmen Dohmeier, MD    Physical Exam: Vitals:   11/13/16 1330 11/13/16 1345 11/13/16 1415 11/13/16 1505  BP: 125/67 130/67 132/69 124/72  Pulse: (!) 53 (!) 52 (!) 55 (!) 55  Resp: 14 16 16 14   Temp:      TempSrc:      SpO2: 98% 98% 98% 97%  Weight:      Height:        Constitutional: NAD, calm, comfortable Vitals:   11/13/16 1330 11/13/16 1345 11/13/16 1415 11/13/16 1505  BP: 125/67 130/67 132/69 124/72  Pulse: (!) 53 (!) 52 (!) 55 (!) 55  Resp: 14 16 16 14   Temp:      TempSrc:      SpO2: 98% 98% 98% 97%  Weight:      Height:       Eyes: PERRL, lids and conjunctivae normal ENMT: Mucous membranes are moist. Posterior pharynx clear of any exudate or lesions.Normal dentition.  Neck: normal, supple, no masses, no thyromegaly Respiratory: clear to auscultation bilaterally, no wheezing, no crackles. Normal respiratory effort. No accessory muscle use.  Cardiovascular: Regular rate and rhythm, no murmurs / rubs / gallops. No extremity edema. 2+ pedal pulses. No carotid bruits.  Abdomen: no tenderness, no masses palpated. No hepatosplenomegaly. Bowel sounds positive.  Musculoskeletal: no clubbing / cyanosis. No joint deformity upper and lower extremities. Good ROM, no contractures. Normal muscle tone.  Skin: no rashes, lesions, ulcers. No induration Neurologic: CN 2-12 grossly intact. Sensation intact, DTR normal. Strength 5/5 in all 4.  Psychiatric: Normal judgment and insight. Alert and oriented x 3. Normal mood.   Labs on Admission: I have personally reviewed following labs and imaging studies  CBC:  Recent Labs Lab 11/13/16 0945  WBC 8.6  HGB 13.3  HCT 40.9  MCV 96.5  PLT 260   Basic Metabolic Panel:  Recent Labs Lab  11/13/16 0945  NA 139  K 3.9  CL 104  CO2 24  GLUCOSE 92  BUN 11  CREATININE 0.76  CALCIUM 9.4   GFR: Estimated Creatinine Clearance: 47.3 mL/min (by C-G formula based on SCr of 0.76 mg/dL). Liver Function Tests: No results for input(s): AST, ALT, ALKPHOS, BILITOT, PROT, ALBUMIN in the last 168 hours. No results for input(s): LIPASE, AMYLASE in the last 168 hours. No results for input(s): AMMONIA in the last 168 hours. Coagulation Profile: No results for input(s): INR, PROTIME in the last 168 hours. Cardiac Enzymes: No results for input(s): CKTOTAL, CKMB, CKMBINDEX, TROPONINI in the last 168 hours. BNP (last 3 results) No results for input(s): PROBNP in the last 8760 hours. HbA1C: No results for input(s): HGBA1C in the last 72 hours. CBG:  Recent Labs Lab 11/13/16 1220  GLUCAP 102*   Lipid Profile: No results for input(s): CHOL, HDL, LDLCALC,  TRIG, CHOLHDL, LDLDIRECT in the last 72 hours. Thyroid Function Tests: No results for input(s): TSH, T4TOTAL, FREET4, T3FREE, THYROIDAB in the last 72 hours. Anemia Panel: No results for input(s): VITAMINB12, FOLATE, FERRITIN, TIBC, IRON, RETICCTPCT in the last 72 hours. Urine analysis:    Component Value Date/Time   COLORURINE YELLOW 10/11/2016 1615   APPEARANCEUR HAZY (A) 10/11/2016 1615   LABSPEC 1.028 10/11/2016 1615   PHURINE 5.0 10/11/2016 1615   GLUCOSEU NEGATIVE 10/11/2016 1615   HGBUR NEGATIVE 10/11/2016 1615   HGBUR trace-lysed 08/01/2008 1400   BILIRUBINUR negative 10/31/2016 1256   BILIRUBINUR n 07/28/2014 0846   KETONESUR negative 10/31/2016 1256   KETONESUR 5 (A) 10/11/2016 1615   PROTEINUR negative 10/31/2016 1256   PROTEINUR NEGATIVE 10/11/2016 1615   UROBILINOGEN 0.2 10/31/2016 1256   UROBILINOGEN 0.2 01/26/2010 1302   NITRITE Negative 10/31/2016 1256   NITRITE NEGATIVE 10/11/2016 1615   LEUKOCYTESUR Negative 10/31/2016 1256   Sepsis Labs: @LABRCNTIP (procalcitonin:4,lacticidven:4) )No results found  for this or any previous visit (from the past 240 hour(s)).   Radiological Exams on Admission: Dg Chest 2 View Result Date: 11/13/2016 No edema or consolidation.  There is aortic atherosclerosis. Electronically Signed   By: Bretta Bang III M.D.   On: 11/13/2016 12:05   Ct Head Wo Contrast Result Date: 11/13/2016 Atrophy with small vessel chronic ischemic changes of deep cerebral white matter. No acute intracranial abnormalities. Electronically Signed   By: Ulyses Southward M.D.   On: 11/13/2016 15:00    EKG: Independently reviewed. Sinus rhythm   Assessment/Plan Principal Problem:   Gait instability - Unclear etiology, possibly from bradycardia verus dementia - PT eval ordered - Appreciate neuro recommendations - CT head negative for acute intracranial abnormalities  - MRI brain pending - ECHO is pending - TSH is pending  Active Problems:   Major depression in remission (HCC) - Continue Cymbalta    Alzheimer's dementia  - Hold Aricept and namenda due to concern for bradycardia     History of postural hypotension - Continue florinef    Hyperlipidemia - Resume statin therapy      DVT prophylaxis: SCD's bilaterally  Code Status: full code Family Communication: no family at the bedside  Disposition Plan: admission for evaluation of gait instability  Consults called: neurology  Admission status: observation    Manson Passey MD [facility] Pager [phone]  If 7PM-7AM, please contact night-coverage [email or web address] Password [facility]  11/13/2016, 3:49 PM

## 2016-11-13 NOTE — ED Provider Notes (Signed)
[facility]-EMERGENCY DEPT Provider Note   CSN: [id number] Arrival date & time: 11/13/16 8295     History    Chief Complaint  Patient presents with  . Aphasia  . Weakness     HPI Kim Hernandez is a 76 y.o. female.  76yo F w/ PMH including Alzheimer's dementia, HLD, anxiety/depression who p/w Weakness, balance problems, and speech problems. Husband reports that over the past 2 weeks, the patient has had gradually worsening generalized weakness. Over the past 3-4 days he has noticed that she seems to have some speech difficulty, trouble forming words at the beginning of sentences and then she eventually is able to speak fluently. Triage note documented more confusion but husband states the patient has been at her baseline level of them are a loss for her known Alzheimer's. He has noticed that she has had some problems with balance and stumbling while walking. She agrees with this. She denies any nausea, vomiting, chest pain, shortness of breath, urinary symptoms, or fevers. She had diarrhea last week that improved after starting Imodium.   Past Medical History:  Diagnosis Date  . Alcohol abuse   . Alzheimer's disease 05/23/2010   ?  . ANXIETY DEPRESSION 05/23/2010  . Arthritis   . DEMENTIA 07/20/2008  . Depression    monopolar  . GERD 05/11/2007   ?  Marland Kitchen Hyperlipidemia   . Kidney stone   . Memory loss   . Migraine    pt denies  . OSTEOARTHRITIS, KNEE 05/23/2010  . Osteopenia   . OSTEOPOROSIS 12/17/2007  . Wrist pain, right 04/17/2011     Patient Active Problem List   Diagnosis Date Noted  . Syncope 10/11/2016  . Syncopal episodes 06/02/2013  . Hyperlipidemia 04/17/2011  . Routine general medical examination at a health care facility 04/17/2011  . Major depression in remission (HCC) 05/23/2010  . Alzheimer's disease 05/23/2010  . OSTEOARTHRITIS, KNEE 05/23/2010  . Osteoporosis 12/17/2007    Past Surgical History:  Procedure Laterality Date  . DILATION AND CURETTAGE OF  UTERUS     forty years ago  . TONSILLECTOMY    . TOTAL KNEE ARTHROPLASTY Right     OB History    No data available        Home Medications    Prior to Admission medications   Medication Sig Start Date End Date Taking? Authorizing Provider  aspirin 81 MG tablet Take 81 mg by mouth daily.    Historical Provider, MD  atorvastatin (LIPITOR) 40 MG tablet TAKE 1 TABLET(40 MG) BY MOUTH DAILY 10/28/16   Myrlene Broker, MD  Cholecalciferol (VITAMIN D3) 1000 UNITS CAPS Take 1 capsule by mouth daily.      Historical Provider, MD  citalopram (CELEXA) 20 MG tablet Take 1 tablet (20 mg total) by mouth daily. 10/30/15   Myrlene Broker, MD  dexlansoprazole (DEXILANT) 60 MG capsule Take 60 mg by mouth daily with supper.    Historical Provider, MD  donepezil (ARICEPT) 10 MG tablet Take 1 tablet (10 mg total) by mouth at bedtime. 09/25/16   Porfirio Mylar Dohmeier, MD  fludrocortisone (FLORINEF) 0.1 MG tablet TK 1 T PO D 10/17/16   Historical Provider, MD  memantine (NAMENDA XR) 28 MG CP24 24 hr capsule Take 1 capsule (28 mg total) by mouth daily. 09/25/16   Melvyn Novas, MD      Family History  Problem Relation Age of Onset  . Anemia Mother   . Alzheimer's disease Mother   . Emphysema Mother  smoker  . Obesity Sister   . Emphysema Sister     smoker  . Coronary artery disease Brother     smoker  . Heart attack Brother   . Obesity Daughter   . Depression Daughter   . Alcohol abuse Son   . Depression Son   . Suicidality Son     10/12-commited suicide in a manic depressive episode     Social History  Substance Use Topics  . Smoking status: Never Smoker  . Smokeless tobacco: Never Used  . Alcohol use Yes     Comment: quit- had a problem w/ alcohol and has abstained for years     Allergies     Poison ivy extract    Review of Systems  10 Systems reviewed and are negative for acute change except as noted in the HPI.   Physical Exam Updated Vital Signs BP 124/72    Pulse (!) 55   Temp 97.9 F (36.6 C)   Resp 14   Ht 5\' 2"  (1.575 m)   Wt 130 lb (59 kg)   SpO2 97%   BMI 23.78 kg/m   Physical Exam  Constitutional: She appears well-developed and well-nourished. No distress.  Awake, alert  HENT:  Head: Normocephalic and atraumatic.  Eyes: Conjunctivae and EOM are normal. Pupils are equal, round, and reactive to light.  Neck: Neck supple.  Cardiovascular: Normal rate, regular rhythm and normal heart sounds.   No murmur heard. Pulmonary/Chest: Effort normal and breath sounds normal. No respiratory distress.  Abdominal: Soft. Bowel sounds are normal. She exhibits no distension. There is no tenderness.  Musculoskeletal: She exhibits no edema.  Neurological: She is alert. She has normal reflexes. No cranial nerve deficit. She exhibits normal muscle tone.  Oriented to person and place, no obvious speech deficits, mild dysmetria on R finger to nose testing, negative pronator drift, no clonus, downgoing Babinski bilaterally 4/5 strength RUE 5/5 strength LUE, BLE  normal sensation x all 4 extremities  Skin: Skin is warm and dry.  Psychiatric: She has a normal mood and affect. Judgment and thought content normal.  Nursing note and vitals reviewed.     ED Treatments / Results  Labs (all labs ordered are listed, but only abnormal results are displayed) Labs Reviewed  CBG MONITORING, ED - Abnormal; Notable for the following:       Result Value   Glucose-Capillary 102 (*)    All other components within normal limits  BASIC METABOLIC PANEL  CBC  URINALYSIS, ROUTINE W REFLEX MICROSCOPIC  I-STAT TROPOININ, ED     EKG  EKG Interpretation  Date/Time:  Wednesday November 13 2016 11:34:32 EDT Ventricular Rate:  59 PR Interval:    QRS Duration: 94 QT Interval:  447 QTC Calculation: 443 R Axis:   -6 Text Interpretation:  Sinus rhythm RSR' in V1 or V2, right VCD or RVH No significant change since last tracing Confirmed by Mamie Diiorio MD, Mithcell Schumpert [phone] on  11/13/2016 12:43:12 PM         Radiology Dg Chest 2 View  Result Date: 11/13/2016 CLINICAL DATA:  Confusion and dysarthria EXAM: CHEST  2 VIEW COMPARISON:  October 11, 2016 FINDINGS: No edema or consolidation. The heart size and pulmonary vascularity are normal. No adenopathy. There is atherosclerotic calcification in the aorta. No bone lesions. IMPRESSION: No edema or consolidation.  There is aortic atherosclerosis. Electronically Signed   By: Bretta BangWilliam  Woodruff III M.D.   On: 11/13/2016 12:05   Ct Head Wo Contrast  Result Date: 11/13/2016 CLINICAL DATA:  Trouble speaking for 3 days, intermittent confusion, generalized weakness, history dementia EXAM: CT HEAD WITHOUT CONTRAST TECHNIQUE: Contiguous axial images were obtained from the base of the skull through the vertex without intravenous contrast. COMPARISON:  None FINDINGS: Brain: Generalized atrophy. Normal ventricular morphology. No midline shift or mass effect. Small vessel chronic ischemic changes of deep cerebral white matter. No intracranial hemorrhage, mass lesion, evidence of acute infarction, or extra-axial fluid collection. Vascular: Minimal atherosclerotic calcification at the carotid siphons Skull: Intact Sinuses/Orbits: Clear Other: N/A IMPRESSION: Atrophy with small vessel chronic ischemic changes of deep cerebral white matter. No acute intracranial abnormalities. Electronically Signed   By: Ulyses Southward M.D.   On: 11/13/2016 15:00    Procedures Procedures (including critical care time) Procedures  Medications Ordered in ED  Medications - No data to display   Initial Impression / Assessment and Plan / ED Course  I have reviewed the triage vital signs and the nursing notes.  Pertinent labs & imaging results that were available during my care of the patient were reviewed by me and considered in my medical decision making (see chart for details).     PT brought in by husband for several weeks of progressive generalized  weakness, recent falls and balance problems, and reports of intermittent difficulty getting words out over the past 3-4 days. On exam, she was awake and alert, comfortable with no complaints. She does endorse balance problems while walking. She had a very subtle decreased grip strength on right with mild dysmetria on right finger to nose testing. The remainder of her neurologic exam was normal. Her lab work is unremarkable although UA is still pending. CXR negative for infection or other acute process. CT negative acute.  Discussed with neurology, Dr. Amada Jupiter who saw patient in consultation. He felt that her balance problems and recent falls were more concerning than the report of possible aphasia. He has recommended admission for brain MRI and physical therapy evaluation. TIA/stroke is on his differential but he feels this is less likely thanother neurologic process. Discussed admission with hospitalist, Dr. Elisabeth Pigeon, and pt admitted for further w/u.   Final Clinical Impressions(s) / ED Diagnoses   Final diagnoses:  Balance problems  Generalized weakness  Aphasia     New Prescriptions   No medications on file       Laurence Spates, MD 11/13/16 1545

## 2016-11-14 ENCOUNTER — Encounter (HOSPITAL_COMMUNITY): Payer: Self-pay | Admitting: Radiology

## 2016-11-14 ENCOUNTER — Observation Stay (HOSPITAL_COMMUNITY): Payer: Medicare Other

## 2016-11-14 ENCOUNTER — Other Ambulatory Visit (HOSPITAL_COMMUNITY): Payer: Self-pay

## 2016-11-14 DIAGNOSIS — R2681 Unsteadiness on feet: Secondary | ICD-10-CM | POA: Diagnosis not present

## 2016-11-14 DIAGNOSIS — G308 Other Alzheimer's disease: Secondary | ICD-10-CM | POA: Diagnosis not present

## 2016-11-14 DIAGNOSIS — R531 Weakness: Secondary | ICD-10-CM | POA: Diagnosis not present

## 2016-11-14 DIAGNOSIS — F0280 Dementia in other diseases classified elsewhere without behavioral disturbance: Secondary | ICD-10-CM | POA: Diagnosis not present

## 2016-11-14 DIAGNOSIS — E784 Other hyperlipidemia: Secondary | ICD-10-CM | POA: Diagnosis not present

## 2016-11-14 DIAGNOSIS — F325 Major depressive disorder, single episode, in full remission: Secondary | ICD-10-CM | POA: Diagnosis not present

## 2016-11-14 LAB — COMPREHENSIVE METABOLIC PANEL
ALBUMIN: 3.3 g/dL — AB (ref 3.5–5.0)
ALK PHOS: 68 U/L (ref 38–126)
ALT: 19 U/L (ref 14–54)
ANION GAP: 8 (ref 5–15)
AST: 24 U/L (ref 15–41)
BUN: 12 mg/dL (ref 6–20)
CALCIUM: 9.1 mg/dL (ref 8.9–10.3)
CO2: 25 mmol/L (ref 22–32)
Chloride: 104 mmol/L (ref 101–111)
Creatinine, Ser: 0.66 mg/dL (ref 0.44–1.00)
GFR calc Af Amer: >60 mL/min (ref >60)
GFR calc non Af Amer: >60 mL/min (ref >60)
Glucose, Bld: 91 mg/dL (ref 65–99)
POTASSIUM: 4.1 mmol/L (ref 3.5–5.1)
SODIUM: 137 mmol/L (ref 135–145)
Total Bilirubin: 0.7 mg/dL (ref 0.3–1.2)
Total Protein: 6 g/dL — ABNORMAL LOW (ref 6.5–8.1)

## 2016-11-14 LAB — CBC
HCT: 38 % (ref 36.0–46.0)
Hemoglobin: 12.1 g/dL (ref 12.0–15.0)
MCH: 30.6 pg (ref 26.0–34.0)
MCHC: 31.8 g/dL (ref 30.0–36.0)
MCV: 96.2 fL (ref 78.0–100.0)
PLATELETS: 249 10*3/uL (ref 150–400)
RBC: 3.95 MIL/uL (ref 3.87–5.11)
RDW: 12.3 % (ref 11.5–15.5)
WBC: 7.7 10*3/uL (ref 4.0–10.5)

## 2016-11-14 LAB — GLUCOSE, CAPILLARY: Glucose-Capillary: 90 mg/dL (ref 65–99)

## 2016-11-14 NOTE — Care Management Note (Signed)
Case Management Note  Patient Details  Name: Kim Hernandez MRN: [id number] Date of Birth: [birth year]  Subjective/Objective:                    Action/Plan: Plan is for patient to discharge home with [facility] services. CM met with the patient and her family and provided a list of [place] agencies. They selected [place]. Santiago Glad with [facility] notified and accepted the referral.  Family to provide transportation home.   Expected Discharge Date:  11/14/16               Expected Discharge Plan:  [place]  In-House Referral:     Discharge planning Services  CM Consult  Post Acute Care Choice:  Home Health Choice offered to:  Patient, Adult Children, Spouse  DME Arranged:    DME Agency:     HH Arranged:  PT, OT, RN [place] Agency:  [place]  Status of Service:  Completed, signed off  If discussed at [place] of Stay Meetings, dates discussed:    Additional Comments:  Pollie Friar, RN 11/14/2016, 3:28 PM

## 2016-11-14 NOTE — Progress Notes (Signed)
Patient feels well this am. I observed her walking in the hall with PT, and doing well. She is awake and alert. She is pleasantly confused, as when I tell her that she is doing better than yesterday, she responds "I don't remember yesterday."   I suspect that her gait difficulty continues to be multifactorial with deconditioning, possibly some mild presyncope, still playing a role.   No further workup at this time. Please call with any further questions or concerns for neurology.   Ritta SlotMcNeill Claris Pech, MD [facility] [phone]  If 7pm- 7am, please page neurology on call as listed in [facility].

## 2016-11-14 NOTE — Evaluation (Signed)
Physical Therapy Evaluation Patient Details Name: Kim Hernandez MRN: [id number] DOB: 1941-06-02 Today's Date: 11/14/2016   History of Present Illness  Kim Hernandez is a 76 y.o. female with medical history significant for dementia, postural hypotension, dyslipidemia admitted due to being weak and some speech problems, more confusion and trouble walking.  Clinical Impression  Patient presents with some evidence for mild gait dysfunction, though no LOB this session.  Does demonstrate fall risk per DGI and would benefit from follow up HHPT at d/c to address.  Given dementia history doubt if assistive device a safe idea.  Would continue with spouse assist as needed, though currently at S level for community mobility more due to safety awareness.     Follow Up Recommendations Home health PT    Equipment Recommendations  None recommended by PT    Recommendations for Other Services       Precautions / Restrictions Precautions Precautions: Fall      Mobility  Bed Mobility Overal bed mobility: Modified Independent                Transfers Overall transfer level: Needs assistance   Transfers: Sit to/from Stand Sit to Stand: Supervision         General transfer comment: for safety as with IV, cues and assist to manage  Ambulation/Gait Ambulation/Gait assistance: Supervision Ambulation Distance (Feet): 170 Feet Assistive device: None Gait Pattern/deviations: Step-through pattern;Decreased stride length     General Gait Details: some decreased foot clearance, but no shuffling noted this session, had decreased safety awareness of socks not pulled up enough, but corrected with cues and assist, see DGI  Stairs Stairs: Yes Stairs assistance: Supervision Stair Management: One rail Right;Step to pattern;Forwards;Alternating pattern Number of Stairs: 2 General stair comments: step through for ascending, step to for descending  Wheelchair Mobility    Modified Rankin  (Stroke Patients Only)       Balance Overall balance assessment: Needs assistance   Sitting balance-Leahy Scale: Good       Standing balance-Leahy Scale: Good Standing balance comment: able to move around over BOS no LOB                 Standardized Balance Assessment Standardized Balance Assessment : Dynamic Gait Index   Dynamic Gait Index Level Surface: Mild Impairment Change in Gait Speed: Mild Impairment Gait with Horizontal Head Turns: Normal Gait with Vertical Head Turns: Normal Gait and Pivot Turn: Mild Impairment Step Over Obstacle: Mild Impairment Step Around Obstacles: Normal Steps: Moderate Impairment Total Score: 18       Pertinent Vitals/Pain Pain Assessment: No/denies pain    Home Living Family/patient expects to be discharged to:: Private residence Living Arrangements: Spouse/significant other Available Help at Discharge: Family;Available 24 hours/day Type of Home: House Home Access: Stairs to enter   [employer] of Steps: 1 Home Layout: One level Home Equipment: None      Prior Function Level of Independence: Independent         Comments: per chart recently needing assist with ambulation, patient poor historian and spouse not available     Hand Dominance        Extremity/Trunk Assessment   Upper Extremity Assessment Upper Extremity Assessment: Overall WFL for tasks assessed    Lower Extremity Assessment Lower Extremity Assessment: Overall WFL for tasks assessed       Communication   Communication: No difficulties  Cognition Arousal/Alertness: Awake/alert Behavior During Therapy: WFL for tasks assessed/performed Overall Cognitive Status: History of cognitive  impairments - at baseline                                        General Comments General comments (skin integrity, edema, etc.): educated with handout for fall prevention in the home    Exercises     Assessment/Plan    PT  Assessment All further PT needs can be met in the next venue of care  PT Problem List Decreased balance;Decreased mobility;Decreased cognition;Decreased safety awareness       PT Treatment Interventions      PT Goals (Current goals can be found in the Care Plan section)  Acute Rehab PT Goals PT Goal Formulation: All assessment and education complete, DC therapy    Frequency     Barriers to discharge        Co-evaluation               End of Session Equipment Utilized During Treatment: Gait belt Activity Tolerance: Patient tolerated treatment well Patient left: in bed;with call bell/phone within reach;with bed alarm set   PT Visit Diagnosis: Other abnormalities of gait and mobility (R26.89)    Time: 0931-1216 PT Time Calculation (min) (ACUTE ONLY): 16 min   Charges:   PT Evaluation $PT Eval Moderate Complexity: 1 Procedure     PT G Codes:   PT G-Codes **NOT FOR INPATIENT CLASS** Functional Assessment Tool Used: AM-PAC 6 Clicks Basic Mobility Functional Limitation: Mobility: Walking and moving around Mobility: Walking and Moving Around Current Status (K4469): At least 20 percent but less than 40 percent impaired, limited or restricted Mobility: Walking and Moving Around Goal Status [phone]): At least 20 percent but less than 40 percent impaired, limited or restricted Mobility: Walking and Moving Around Discharge Status [phone]): At least 20 percent but less than 40 percent impaired, limited or restricted    Kim Hernandez 11/14/2016   [name] 11/14/2016, 10:17 AM

## 2016-11-14 NOTE — Care Management Obs Status (Signed)
MEDICARE OBSERVATION STATUS NOTIFICATION   Patient Details  Name: Kim PutnamBlanche C Okey MRN: [id number] Date of Birth: 08/23/1940   Medicare Observation Status Notification Given:  Yes    Kermit BaloKelli F Fredric Slabach, RN 11/14/2016, 3:27 PM

## 2016-11-14 NOTE — Progress Notes (Addendum)
RN discussed discharge instructions with patient and patient's husband and daughter. All verbalized understanding of f/u appt with pcp, no medication changes. Dr. Elisabeth Pigeonevine spoke with patient, patient's husband and daughter about discharge instructions.   RN discussed fall risk prevention in the home. Patient's husband verbalized understanding.

## 2016-11-14 NOTE — Patient Instructions (Signed)
Fall Prevention in the Home Falls can cause injuries. They can happen to people of all ages. There are many things you can do to make your home safe and to help prevent falls. What can I do on the outside of my home?  Regularly fix the edges of walkways and driveways and fix any cracks.  Remove anything that might make you trip as you walk through a door, such as a raised step or threshold.  Trim any bushes or trees on the path to your home.  Use bright outdoor lighting.  Clear any walking paths of anything that might make someone trip, such as rocks or tools.  Regularly check to see if handrails are loose or broken. Make sure that both sides of any steps have handrails.  Any raised decks and porches should have guardrails on the edges.  Have any leaves, snow, or ice cleared regularly.  Use sand or salt on walking paths during winter.  Clean up any spills in your garage right away. This includes oil or grease spills. What can I do in the bathroom?  Use night lights.  Install grab bars by the toilet and in the tub and shower. Do not use towel bars as grab bars.  Use non-skid mats or decals in the tub or shower.  If you need to sit down in the shower, use a plastic, non-slip stool.  Keep the floor dry. Clean up any water that spills on the floor as soon as it happens.  Remove soap buildup in the tub or shower regularly.  Attach bath mats securely with double-sided non-slip rug tape.  Do not have throw rugs and other things on the floor that can make you trip. What can I do in the bedroom?  Use night lights.  Make sure that you have a light by your bed that is easy to reach.  Do not use any sheets or blankets that are too big for your bed. They should not hang down onto the floor.  Have a firm chair that has side arms. You can use this for support while you get dressed.  Do not have throw rugs and other things on the floor that can make you trip. What can I do in the  kitchen?  Clean up any spills right away.  Avoid walking on wet floors.  Keep items that you use a lot in easy-to-reach places.  If you need to reach something above you, use a strong step stool that has a grab bar.  Keep electrical cords out of the way.  Do not use floor polish or wax that makes floors slippery. If you must use wax, use non-skid floor wax.  Do not have throw rugs and other things on the floor that can make you trip. What can I do with my stairs?  Do not leave any items on the stairs.  Make sure that there are handrails on both sides of the stairs and use them. Fix handrails that are broken or loose. Make sure that handrails are as long as the stairways.  Check any carpeting to make sure that it is firmly attached to the stairs. Fix any carpet that is loose or worn.  Avoid having throw rugs at the top or bottom of the stairs. If you do have throw rugs, attach them to the floor with carpet tape.  Make sure that you have a light switch at the top of the stairs and the bottom of the stairs. If you do   not have them, ask someone to add them for you. What else can I do to help prevent falls?  Wear shoes that:  Do not have high heels.  Have rubber bottoms.  Are comfortable and fit you well.  Are closed at the toe. Do not wear sandals.  If you use a stepladder:  Make sure that it is fully opened. Do not climb a closed stepladder.  Make sure that both sides of the stepladder are locked into place.  Ask someone to hold it for you, if possible.  Clearly mark and make sure that you can see:  Any grab bars or handrails.  First and last steps.  Where the edge of each step is.  Use tools that help you move around (mobility aids) if they are needed. These include:  Canes.  Walkers.  Scooters.  Crutches.  Turn on the lights when you go into a dark area. Replace any light bulbs as soon as they burn out.  Set up your furniture so you have a clear path.  Avoid moving your furniture around.  If any of your floors are uneven, fix them.  If there are any pets around you, be aware of where they are.  Review your medicines with your doctor. Some medicines can make you feel dizzy. This can increase your chance of falling. Ask your doctor what other things that you can do to help prevent falls. This information is not intended to replace advice given to you by your health care provider. Make sure you discuss any questions you have with your health care provider. Document Released: 06/01/2009 Document Revised: 01/11/2016 Document Reviewed: 09/09/2014 Elsevier Interactive Patient Education  2017 [employer]  

## 2016-11-14 NOTE — Discharge Summary (Signed)
Physician Discharge Summary  Kim Hernandez ZOX:[id number] DOB: [birth year] DOA: 11/13/2016  PCP: Gaspar Garbe, MD  Admit date: 11/13/2016 Discharge date: 11/14/2016  Recommendations for Outpatient Follow-up:  1. Continue mediations as prescribed 2. Follow up with PCP in 1 week to make sure symptoms are stable   Discharge Diagnoses:  Principal Problem:   Gait instability Active Problems:   Major depression in remission (HCC)   Alzheimer's disease   Hyperlipidemia    Discharge Condition: stable   Diet recommendation: as tolerated   History of present illness:  76 y.o. female with medical history significant for dementia, postural hypotension, dyslipidemia who presented to [facility] with reports of being weak and some speech problems, more confusion and trouble walking for past few days prior to this admissions. She needed help with ambulating because she would fall without assistance. No loss of consciousness.   In ED, pt was hemodynamically stable. She had mild bradycardia with HR of 55. Her blood work was unremarkable. CT head showed no acute intracranial findings. Seen by neuro in consultation.  Hospital Course:   Principal Problem:   Gait instability - Unclear etiology, possibly from bradycardia verus dementia; also pt had few episode of diarrhea in a week prior to admission but this has now resolved so gait instability possibly from dehydration from GI losses  - PT eval ordered - Appreciate neuro recommendations - CT head negative for acute intracranial abnormalities  - MRI brain negative for acute intracranial findings  - ECHO showed EF 60% - TSH is WNL  Active Problems:   Major depression in remission (HCC) - Continue Cymbalta    Alzheimer's dementia  - Resume home meds     History of postural hypotension - Continue florinef    Hyperlipidemia - Resume statin therapy on discharge       Signed:  Manson Passey, MD  [facility] 11/14/2016, 3:08  PM  Pager #: [phone]  Time spent in minutes: less than 30 minutes   Discharge Exam: Vitals:   11/14/16 0921 11/14/16 1309  BP: (!) 110/51 (!) 141/56  Pulse: 66 63  Resp: 20 16  Temp: 98.5 F (36.9 C) 98.7 F (37.1 C)   Vitals:   11/14/16 0106 11/14/16 0521 11/14/16 0921 11/14/16 1309  BP: (!) 133/57 136/65 (!) 110/51 (!) 141/56  Pulse: 64 66 66 63  Resp: 18 18 20 16   Temp: 98.2 F (36.8 C) 98 F (36.7 C) 98.5 F (36.9 C) 98.7 F (37.1 C)  TempSrc: Oral Oral Oral Oral  SpO2: 96% 98% 98% 96%  Weight:  62.6 kg (138 lb)    Height:        General: Pt is alert, follows commands appropriately, not in acute distress Cardiovascular: Regular rate and rhythm, S1/S2 + Respiratory: Clear to auscultation bilaterally, no wheezing, no crackles, no rhonchi Abdominal: Soft, non tender, non distended, bowel sounds +, no guarding Extremities: no edema, no cyanosis, pulses palpable bilaterally DP and PT Neuro: Grossly nonfocal  Discharge Instructions  Discharge Instructions    Call MD for:  persistant nausea and vomiting    Complete by:  As directed    Call MD for:  redness, tenderness, or signs of infection (pain, swelling, redness, odor or green/yellow discharge around incision site)    Complete by:  As directed    Call MD for:  severe uncontrolled pain    Complete by:  As directed    Diet - low sodium heart healthy    Complete by:  As  directed    Increase activity slowly    Complete by:  As directed      Allergies as of 11/14/2016      Reactions   Poison Ivy Extract Rash      Medication List    TAKE these medications   aspirin 81 MG tablet Take 81 mg by mouth daily.   atorvastatin 40 MG tablet Commonly known as:  LIPITOR TAKE 1 TABLET(40 MG) BY MOUTH DAILY   citalopram 20 MG tablet Commonly known as:  CELEXA Take 1 tablet (20 mg total) by mouth daily.   donepezil 10 MG tablet Commonly known as:  ARICEPT Take 1 tablet (10 mg total) by mouth at bedtime.    fludrocortisone 0.1 MG tablet Commonly known as:  FLORINEF TK 1 T PO D   memantine 28 MG Cp24 24 hr capsule Commonly known as:  NAMENDA XR Take 1 capsule (28 mg total) by mouth daily.   Vitamin D3 1000 units Caps Take 1 capsule by mouth daily.      Follow-up Information    Gaspar Garbe, MD. Schedule an appointment as soon as possible for a visit in 1 week(s).   Specialty:  Internal Medicine Contact information: [address] [place] [address] [phone]            The results of significant diagnostics from this hospitalization (including imaging, microbiology, ancillary and laboratory) are listed below for reference.    Significant Diagnostic Studies: Dg Chest 2 View  Result Date: 11/13/2016 CLINICAL DATA:  Confusion and dysarthria EXAM: CHEST  2 VIEW COMPARISON:  October 11, 2016 FINDINGS: No edema or consolidation. The heart size and pulmonary vascularity are normal. No adenopathy. There is atherosclerotic calcification in the aorta. No bone lesions. IMPRESSION: No edema or consolidation.  There is aortic atherosclerosis. Electronically Signed   By: Bretta Bang III M.D.   On: 11/13/2016 12:05   Ct Head Wo Contrast  Result Date: 11/13/2016 CLINICAL DATA:  Trouble speaking for 3 days, intermittent confusion, generalized weakness, history dementia EXAM: CT HEAD WITHOUT CONTRAST TECHNIQUE: Contiguous axial images were obtained from the base of the skull through the vertex without intravenous contrast. COMPARISON:  None FINDINGS: Brain: Generalized atrophy. Normal ventricular morphology. No midline shift or mass effect. Small vessel chronic ischemic changes of deep cerebral white matter. No intracranial hemorrhage, mass lesion, evidence of acute infarction, or extra-axial fluid collection. Vascular: Minimal atherosclerotic calcification at the carotid siphons Skull: Intact Sinuses/Orbits: Clear Other: N/A IMPRESSION: Atrophy with small vessel chronic ischemic  changes of deep cerebral white matter. No acute intracranial abnormalities. Electronically Signed   By: Ulyses Southward M.D.   On: 11/13/2016 15:00    Microbiology: No results found for this or any previous visit (from the past 240 hour(s)).   Labs: Basic Metabolic Panel:  Recent Labs Lab 11/13/16 0945 11/14/16 0538  NA 139 137  K 3.9 4.1  CL 104 104  CO2 24 25  GLUCOSE 92 91  BUN 11 12  CREATININE 0.76 0.66  CALCIUM 9.4 9.1   Liver Function Tests:  Recent Labs Lab 11/14/16 0538  AST 24  ALT 19  ALKPHOS 68  BILITOT 0.7  PROT 6.0*  ALBUMIN 3.3*   No results for input(s): LIPASE, AMYLASE in the last 168 hours. No results for input(s): AMMONIA in the last 168 hours. CBC:  Recent Labs Lab 11/13/16 0945 11/14/16 0538  WBC 8.6 7.7  HGB 13.3 12.1  HCT 40.9 38.0  MCV 96.5 96.2  PLT 260 249   Cardiac Enzymes: No results for input(s): CKTOTAL, CKMB, CKMBINDEX, TROPONINI in the last 168 hours. BNP: BNP (last 3 results) No results for input(s): BNP in the last 8760 hours.  ProBNP (last 3 results) No results for input(s): PROBNP in the last 8760 hours.  CBG:  Recent Labs Lab 11/13/16 1220 11/14/16 0622  GLUCAP 102* 90

## 2016-11-23 DIAGNOSIS — F325 Major depressive disorder, single episode, in full remission: Secondary | ICD-10-CM | POA: Diagnosis not present

## 2016-11-23 DIAGNOSIS — F419 Anxiety disorder, unspecified: Secondary | ICD-10-CM | POA: Diagnosis not present

## 2016-11-23 DIAGNOSIS — G309 Alzheimer's disease, unspecified: Secondary | ICD-10-CM | POA: Diagnosis not present

## 2016-11-23 DIAGNOSIS — K219 Gastro-esophageal reflux disease without esophagitis: Secondary | ICD-10-CM | POA: Diagnosis not present

## 2016-11-23 DIAGNOSIS — E785 Hyperlipidemia, unspecified: Secondary | ICD-10-CM | POA: Diagnosis not present

## 2016-11-23 DIAGNOSIS — G308 Other Alzheimer's disease: Secondary | ICD-10-CM | POA: Diagnosis not present

## 2016-11-23 DIAGNOSIS — F418 Other specified anxiety disorders: Secondary | ICD-10-CM | POA: Diagnosis not present

## 2016-11-23 DIAGNOSIS — F0280 Dementia in other diseases classified elsewhere without behavioral disturbance: Secondary | ICD-10-CM | POA: Diagnosis not present

## 2016-11-27 DIAGNOSIS — H524 Presbyopia: Secondary | ICD-10-CM | POA: Diagnosis not present

## 2016-12-15 DIAGNOSIS — F321 Major depressive disorder, single episode, moderate: Secondary | ICD-10-CM | POA: Diagnosis not present

## 2016-12-15 DIAGNOSIS — I959 Hypotension, unspecified: Secondary | ICD-10-CM | POA: Diagnosis not present

## 2016-12-15 DIAGNOSIS — R2689 Other abnormalities of gait and mobility: Secondary | ICD-10-CM | POA: Diagnosis not present

## 2016-12-15 DIAGNOSIS — F0390 Unspecified dementia without behavioral disturbance: Secondary | ICD-10-CM | POA: Diagnosis not present

## 2017-01-03 ENCOUNTER — Encounter: Payer: Self-pay | Admitting: Neurology

## 2017-01-06 ENCOUNTER — Other Ambulatory Visit: Payer: Self-pay

## 2017-01-06 MED ORDER — DONEPEZIL HCL 10 MG PO TABS: 10.0000 mg | ORAL_TABLET | Freq: Every day | ORAL | 3 refills | Status: DC

## 2017-01-20 ENCOUNTER — Other Ambulatory Visit: Payer: Self-pay | Admitting: Neurology

## 2017-01-31 ENCOUNTER — Other Ambulatory Visit: Payer: Self-pay | Admitting: Internal Medicine

## 2017-01-31 ENCOUNTER — Other Ambulatory Visit: Payer: Self-pay | Admitting: Neurology

## 2017-01-31 NOTE — Telephone Encounter (Signed)
Routing to dr. Wylene Simmerisovec, patient's pcp to handle

## 2017-03-26 ENCOUNTER — Ambulatory Visit (INDEPENDENT_AMBULATORY_CARE_PROVIDER_SITE_OTHER): Payer: Medicare Other | Admitting: Neurology

## 2017-03-26 ENCOUNTER — Encounter: Payer: Self-pay | Admitting: Neurology

## 2017-03-26 VITALS — BP 121/69 | HR 62 | Ht 61.0 in | Wt 134.0 lb

## 2017-03-26 DIAGNOSIS — R55 Syncope and collapse: Secondary | ICD-10-CM | POA: Insufficient documentation

## 2017-03-26 DIAGNOSIS — F0280 Dementia in other diseases classified elsewhere without behavioral disturbance: Secondary | ICD-10-CM

## 2017-03-26 DIAGNOSIS — G301 Alzheimer's disease with late onset: Secondary | ICD-10-CM | POA: Diagnosis not present

## 2017-03-26 DIAGNOSIS — Z8639 Personal history of other endocrine, nutritional and metabolic disease: Secondary | ICD-10-CM | POA: Insufficient documentation

## 2017-03-26 DIAGNOSIS — I9589 Other hypotension: Secondary | ICD-10-CM | POA: Diagnosis not present

## 2017-03-26 DIAGNOSIS — I959 Hypotension, unspecified: Secondary | ICD-10-CM | POA: Insufficient documentation

## 2017-03-26 DIAGNOSIS — R2681 Unsteadiness on feet: Secondary | ICD-10-CM | POA: Diagnosis not present
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
## 2017-04-08 ENCOUNTER — Encounter: Payer: Self-pay | Admitting: Neurology

## 2017-04-10 DIAGNOSIS — F0390 Unspecified dementia without behavioral disturbance: Secondary | ICD-10-CM | POA: Diagnosis not present

## 2017-04-10 DIAGNOSIS — F321 Major depressive disorder, single episode, moderate: Secondary | ICD-10-CM | POA: Diagnosis not present

## 2017-04-10 DIAGNOSIS — K219 Gastro-esophageal reflux disease without esophagitis: Secondary | ICD-10-CM | POA: Diagnosis not present

## 2017-04-10 DIAGNOSIS — E7800 Pure hypercholesterolemia, unspecified: Secondary | ICD-10-CM | POA: Diagnosis not present

## 2017-04-17 ENCOUNTER — Ambulatory Visit: Payer: Medicare Other | Attending: Neurology | Admitting: Rehabilitation

## 2017-04-17 ENCOUNTER — Encounter: Payer: Self-pay | Admitting: Rehabilitation

## 2017-04-17 DIAGNOSIS — R2689 Other abnormalities of gait and mobility: Secondary | ICD-10-CM | POA: Diagnosis present

## 2017-04-17 DIAGNOSIS — R2681 Unsteadiness on feet: Secondary | ICD-10-CM | POA: Insufficient documentation

## 2017-04-17 DIAGNOSIS — M6281 Muscle weakness (generalized): Secondary | ICD-10-CM | POA: Insufficient documentation

## 2017-04-17 NOTE — Therapy (Signed)
[facility] [address] [place], [place], [address] Phone: [phone]   Fax:  [phone]  Physical Therapy Evaluation  Patient Details  Name: Kim PutnamBlanche C Hernandez MRN: [id number] Date of Birth: 04/24/1941 Referring Provider: Melvyn Novasarmen Dohmeier, MD  Encounter Date: 04/17/2017      PT End of Session - 04/17/17 1554    Visit Number 1   Number of Visits 8   Date for PT Re-Evaluation 06/16/17  only plan to see for 4 weeks   Authorization Type Blue MCR G code on every 10th visit   PT Start Time 0931   PT Stop Time 1015   PT Time Calculation (min) 44 min   Activity Tolerance Patient tolerated treatment well   Behavior During Therapy [facility] for tasks assessed/performed      Past Medical History:  Diagnosis Date  . Alcohol abuse   . Alzheimer's disease 05/23/2010   ?  . ANXIETY DEPRESSION 05/23/2010  . Arthritis   . DEMENTIA 07/20/2008  . Depression    monopolar  . GERD 05/11/2007   ?  Marland Kitchen. Hyperlipidemia   . Kidney stone   . Memory loss   . Migraine    pt denies  . OSTEOARTHRITIS, KNEE 05/23/2010  . Osteopenia   . OSTEOPOROSIS 12/17/2007  . Wrist pain, right 04/17/2011    Past Surgical History:  Procedure Laterality Date  . DILATION AND CURETTAGE OF UTERUS     forty years ago  . TONSILLECTOMY    . TOTAL KNEE ARTHROPLASTY Right     There were no vitals filed for this visit.       Subjective Assessment - 04/17/17 0934    Subjective "My husband, against my will, said I need to come here."  "Ask him what he thinks."  Husband states that she had some serious illnesses approx 4 months ago.  She was admitted to hospital, but she didn't see PT because she started feeling better and convinced me that she didn't need therapy.  He states she has not been the same physically since then and getting in/out of the car is difficult.     Patient is accompained by: Family member  Husband, Philip   Pertinent History Hx of  Alzheimer's disease, hypotension   Limitations House hold activities;Walking   Patient Stated Goals "I want her to strengthen her legs so she can move better." (Per husband)    Currently in Pain? No/denies            [facility] PT Assessment - 04/17/17 0001      Assessment   Medical Diagnosis gait instability   Referring Provider Melvyn Novasarmen Dohmeier, MD   Onset Date/Surgical Date --  about 4 months ago when hospitalized   Prior Therapy None     Precautions   Precautions Fall   Precaution Comments Note history of Alzheimer's     Restrictions   Weight Bearing Restrictions No     Balance Screen   Has the patient fallen in the past 6 months Yes   How many times? 1   Has the patient had a decrease in activity level because of a fear of falling?  No   Is the patient reluctant to leave their home because of a fear of falling?  No     Home Environment   Living Environment Private residence   Living Arrangements Spouse/significant other   Available Help at Discharge Family;Available PRN/intermittently  husband runs errands, works out 3days/week 2 hours   Type  of Home House   Home Access Stairs to enter   Entrance Stairs-Number of Steps 2   Entrance Stairs-Rails None   Home Layout One level   Home Equipment Walker - 2 wheels  walk in shower      Prior Function   Level of Independence Independent with basic ADLs   Vocation Retired   Psychiatrist, watching TV, going out to eat      Cognition   Overall Cognitive Status History of cognitive impairments - at baseline     Sensation   Light Touch Appears Intact  Has pain just below TKA incision   Hot/Cold Appears Intact   Proprioception Appears Intact     Coordination   Gross Motor Movements are Fluid and Coordinated Yes   Fine Motor Movements are Fluid and Coordinated Yes     ROM / Strength   AROM / PROM / Strength Strength     Strength   Overall Strength Deficits   Overall Strength Comments B hip flex 3+ to 4/5,  otherwise 4/5 to 5/5 all remaining muscle groups     Transfers   Transfers Sit to Stand;Stand to Sit   Sit to Stand 6: Modified independent (Device/Increase time)   Five time sit to stand comments  11.66 with lack of controlled descent despite cues (did without UE support)    Stand to Sit 6: Modified independent (Device/Increase time)     Ambulation/Gait   Ambulation/Gait Yes   Gait velocity 2.70 ft/sec (in shoes with heel)      Standardized Balance Assessment   Standardized Balance Assessment Dynamic Gait Index     Dynamic Gait Index   Level Surface Normal   Change in Gait Speed Mild Impairment   Gait with Horizontal Head Turns Moderate Impairment   Gait with Vertical Head Turns Moderate Impairment   Gait and Pivot Turn Mild Impairment   Step Over Obstacle Moderate Impairment   Step Around Obstacles Mild Impairment   Steps Mild Impairment   Total Score 14   DGI comment: Scores of 19 or less are predictive of falls in older community living adults            Objective measurements completed on examination: See above findings.          [facility] Adult PT Treatment/Exercise - 04/17/17 0001      Ambulation/Gait   Ambulation Distance (Feet) 300 Feet   Assistive device None   Gait Pattern Step-through pattern;Decreased stride length;Shuffle;Trunk flexed  tends to walk on outside of R foot, however had heeled shoe   Ambulation Surface Level;Indoor   Stairs Yes   Stairs Assistance 5: Supervision   Stair Management Technique Two rails;Alternating pattern;Forwards   Number of Stairs 4   Height of Stairs 6                PT Education - 04/17/17 1553    Education provided Yes   Education Details POC, goals, importance of compliance with HEP to make gains in therapy.    Person(s) Educated Patient;Spouse   Methods Explanation   Comprehension Verbalized understanding          PT Short Term Goals - 04/17/17 1601      PT SHORT TERM GOAL #1   Title =LTGs            PT Long Term Goals - 04/17/17 1601      PT LONG TERM GOAL #1   Title Pt/spouse will be independent with HEP in  order to indicate improved functional mobility and balance.     Time 4   Period Weeks   Status New   Target Date 05/17/17     PT LONG TERM GOAL #2   Title Pt will improve DGI to >/=17/24 in order to indicate decreased fall risk.     Time 4   Period Weeks   Status New   Target Date 05/17/17     PT LONG TERM GOAL #3   Title Pt will ambulate up to 1000' over varying outdoor surfaces at distant S level (due to cognition) in order to indicate return to community and leisure activity.     Time 4   Period Weeks   Status New   Target Date 05/17/17     PT LONG TERM GOAL #4   Title Pt/spouse will verbalize return to walking up to 30 mins at a time for continued community fitness once therapy is complete.     Time 4   Period Weeks   Status New   Target Date 05/17/17                Plan - 04/20/17 1555    Clinical Impression Statement Pt presents with generalized weakness and decreased balance beginning approx 4 months ago following several illnesses and admission to hospital.  Pt with history of Alzheimer's Disease, postural hypotension, and osteopenia which could impact progress in therapy.   Note during eval, gait speed 2.70 ft/sec with S due to pt being in heeled shoe, 5TSS of 11.66 secs without UE support but with inability to complete controlled descent, and DGI of 14/24 indicative of elevated fall risk (again note that she had heeled shoes on which likely effected her balance).  Pt will benefit from OP neuro PT to address deficits, however am cautious of her progress due to cognitive decline.     History and Personal Factors relevant to plan of care: Alzheimer's disease   Clinical Presentation Evolving   Clinical Presentation due to: cognitive status   Clinical Decision Making Low   Rehab Potential Fair   Clinical Impairments Affecting Rehab Potential  hx of Alzheimer's    PT Frequency 2x / week   PT Duration 4 weeks   PT Treatment/Interventions ADLs/Self Care Home Management;Gait training;Stair training;Functional mobility training;Therapeutic activities;Therapeutic exercise;Balance training;Neuromuscular re-education;Patient/family education;Vestibular   PT Next Visit Plan They were provided education to begin walking at home for up to 10 mins if she could tolerate so check compliance, initiate HEP for high level balance and strengthening   Consulted and Agree with Plan of Care Patient;Family member/caregiver   Family Member Consulted Husband Philip      Patient will benefit from skilled therapeutic intervention in order to improve the following deficits and impairments:  Decreased activity tolerance, Decreased balance, Decreased endurance, Decreased mobility, Decreased strength  Visit Diagnosis: Unsteadiness on feet - Plan: PT plan of care cert/re-cert  Muscle weakness (generalized) - Plan: PT plan of care cert/re-cert  Other abnormalities of gait and mobility - Plan: PT plan of care cert/re-cert      G-Codes - 2017/04/20 1604    Functional Assessment Tool Used (Outpatient Only) DGI: 14/24   Functional Limitation Mobility: Walking and moving around   Mobility: Walking and Moving Around Current Status (Z6109) At least 40 percent but less than 60 percent impaired, limited or restricted   Mobility: Walking and Moving Around Goal Status (U0454) At least 20 percent but less than 40 percent impaired, limited or restricted  Problem List Patient Active Problem List   Diagnosis Date Noted  . History of dehydration 03/26/2017  . Arterial hypotension 03/26/2017  . Syncope and collapse 03/26/2017  . Gait instability 11/13/2016  . Hyperlipidemia 04/17/2011  . Major depression in remission (HCC) 05/23/2010  . Alzheimer's disease 05/23/2010    Harriet Butte, PT, MPT [facility] [address]  [address] [place], [place], [address] Phone: [phone]   Fax:  [phone] 04/17/17, 4:06 PM  Name: Kim Hernandez MRN: [id number] Date of Birth: 07-13-1941

## 2017-04-22 ENCOUNTER — Ambulatory Visit: Payer: Medicare Other | Attending: Neurology | Admitting: Physical Therapy

## 2017-04-22 ENCOUNTER — Encounter: Payer: Self-pay | Admitting: Physical Therapy

## 2017-04-22 DIAGNOSIS — M6281 Muscle weakness (generalized): Secondary | ICD-10-CM | POA: Diagnosis not present

## 2017-04-22 DIAGNOSIS — R2689 Other abnormalities of gait and mobility: Secondary | ICD-10-CM | POA: Insufficient documentation

## 2017-04-22 DIAGNOSIS — R2681 Unsteadiness on feet: Secondary | ICD-10-CM | POA: Insufficient documentation

## 2017-04-22 NOTE — Patient Instructions (Addendum)
Functional Quadriceps: Sit to Stand    Sit on edge of chair, feet flat on floor and arms across chest. Stand upright, extending knees fully, then slowly sit back down.  Repeat _10___ times per set. Do __1__ sessions per day.  http://orth.exer.us/735   Copyright  VHI. All rights reserved.   Perform these at counter top so to hold for balance as needed:  Heel Raises    Stand with support.riase heels up into air standing on toes. Hold this position for 5 seconds, then slowly lower heels down.  Repeat __10_ times. Do __1_ times a day.  Copyright  VHI. All rights reserved.   Side-Stepping    Facing the kitchen counter top holding with light hand support: step sideway along counter and then side step back the other way. Perform 3 laps toward each side. 1 time a day.   Copyright  VHI. All rights reserved.    "I love a Engineering geologistarade" Lift    Holding on: slowly march forward along counter top and then slowly march backwards along counter top to start position.  Repeat _3_ laps each way. Do _1-2_ sessions per day.  http://gt2.exer.us/345   Copyright  VHI. All rights reserved.    Feet Heel-Toe "Tandem"    Arms at sides/holding counter for support/balance:  walk a straight line forward by brining one foot directly in front of the other and then walk a straight line backwards by bringing one foot directly behind the other one. Repeat for 3 laps each way. Do __1-2__ sessions per day.  Copyright  VHI. All rights reserved.

## 2017-04-23 NOTE — Therapy (Signed)
[facility] [address] [place], [place], [address] Phone: [phone]   Fax:  [phone]  Physical Therapy Treatment  Patient Details  Name: Kim Hernandez MRN: [id number] Date of Birth: 1941/06/01 Referring Provider: Melvyn Novas, MD  Encounter Date: 04/22/2017   04/22/17 1108  PT Visits / Re-Eval  Visit Number 2  Number of Visits 8  Date for PT Re-Evaluation 06/16/17 (only plan to see for 4 weeks)  Authorization  Authorization Type Blue MCR G code on every 10th visit  PT Time Calculation  PT Start Time 1104  PT Stop Time 1145  PT Time Calculation (min) 41 min  PT - End of Session  Activity Tolerance Patient tolerated treatment well  Behavior During Therapy [facility] for tasks assessed/performed     Past Medical History:  Diagnosis Date  . Alcohol abuse   . Alzheimer's disease 05/23/2010   ?  . ANXIETY DEPRESSION 05/23/2010  . Arthritis   . DEMENTIA 07/20/2008  . Depression    monopolar  . GERD 05/11/2007   ?  Marland Kitchen Hyperlipidemia   . Kidney stone   . Memory loss   . Migraine    pt denies  . OSTEOARTHRITIS, KNEE 05/23/2010  . Osteopenia   . OSTEOPOROSIS 12/17/2007  . Wrist pain, right 04/17/2011    Past Surgical History:  Procedure Laterality Date  . DILATION AND CURETTAGE OF UTERUS     forty years ago  . TONSILLECTOMY    . TOTAL KNEE ARTHROPLASTY Right     There were no vitals filed for this visit.     04/22/17 1107  Symptoms/Limitations  Subjective Have not walked at home as they keep forgetting due to having such a set routine. Trying to find a way to work the walking into the am as it's too hot in the pm. Denies any falls. Does report some soreness in left knee today.   Patient is accompained by: Family member (spouse, Aneta Mins)  Pertinent History Hx of Alzheimer's disease, hypotension  Limitations House hold activities;Walking  Patient Stated Goals "I want her to strengthen her legs so she  can move better." (Per husband)   Pain Assessment  Currently in Pain? No/denies      04/22/17 1130  Transfers  Transfers Sit to Stand;Stand to Sit  Sit to Stand 6: Modified independent (Device/Increase time)  Stand to Sit 6: Modified independent (Device/Increase time)  Ambulation/Gait  Ambulation/Gait Yes  Ambulation/Gait Assistance 4: Min guard;4: Min assist  Assistive device None  Ambulation Surface Level;Indoor  Gait Comments gait along ~50 foot hallway: Forward gait with head movements left<>fwd<>right and up<>fwd<>down x 4 laps each with min guard to min assist for balance    issued the following to pt's HEP: Functional Quadriceps: Sit to Stand    Sit on edge of chair, feet flat on floor and arms across chest. Stand upright, extending knees fully, then slowly sit back down.  Repeat _10___ times per set. Do __1__ sessions per day.  http://orth.exer.us/735   Copyright  VHI. All rights reserved.   Perform these at counter top so to hold for balance as needed:  Heel Raises    Stand with support.riase heels up into air standing on toes. Hold this position for 5 seconds, then slowly lower heels down.  Repeat __10_ times. Do __1_ times a day.  Copyright  VHI. All rights reserved.   Side-Stepping    Facing the kitchen counter top holding with light hand support: step sideway along counter  and then side step back the other way. Perform 3 laps toward each side. 1 time a day.   Copyright  VHI. All rights reserved.    "I love a Engineering geologistarade" Lift    Holding on: slowly march forward along counter top and then slowly march backwards along counter top to start position.  Repeat _3_ laps each way. Do _1-2_ sessions per day.  http://gt2.exer.us/345   Copyright  VHI. All rights reserved.    Feet Heel-Toe "Tandem"    Arms at sides/holding counter for support/balance:  walk a straight line forward by brining one foot directly in front of the other and then walk a  straight line backwards by bringing one foot directly behind the other one. Repeat for 3 laps each way. Do __1-2__ sessions per day.  Copyright  VHI. All rights reserved.      04/22/17 2201  PT Education  Education provided Yes  Education Details HEP for balance  Person(s) Educated Patient;Spouse  Methods Explanation;Demonstration;Verbal cues;Handout  Comprehension Verbalized understanding;Returned demonstration;Verbal cues required;Need further instruction          PT Short Term Goals - 04/17/17 1601      PT SHORT TERM GOAL #1   Title =LTGs           PT Long Term Goals - 04/17/17 1601      PT LONG TERM GOAL #1   Title Pt/spouse will be independent with HEP in order to indicate improved functional mobility and balance.     Time 4   Period Weeks   Status New   Target Date 05/17/17     PT LONG TERM GOAL #2   Title Pt will improve DGI to >/=17/24 in order to indicate decreased fall risk.     Time 4   Period Weeks   Status New   Target Date 05/17/17     PT LONG TERM GOAL #3   Title Pt will ambulate up to 1000' over varying outdoor surfaces at distant S level (due to cognition) in order to indicate return to community and leisure activity.     Time 4   Period Weeks   Status New   Target Date 05/17/17     PT LONG TERM GOAL #4   Title Pt/spouse will verbalize return to walking up to 30 mins at a time for continued community fitness once therapy is complete.     Time 4   Period Weeks   Status New   Target Date 05/17/17        04/22/17 1109  Plan  Clinical Impression Statement Today's skilled session focused on establishing an HEP for balance with no issues reported with performance in session. Pt did need cues to remain on task. Remainder of session addressed dynamic gait/balance with no issues reported. Pt is progressing toward goals and should benefit from continued PT to progress toward unmet goals.               Pt will benefit from skilled therapeutic  intervention in order to improve on the following deficits Decreased activity tolerance;Decreased balance;Decreased endurance;Decreased mobility;Decreased strength  Rehab Potential Fair  Clinical Impairments Affecting Rehab Potential hx of Alzheimer's   PT Frequency 2x / week  PT Duration 4 weeks  PT Treatment/Interventions ADLs/Self Care Home Management;Gait training;Stair training;Functional mobility training;Therapeutic activities;Therapeutic exercise;Balance training;Neuromuscular re-education;Patient/family education;Vestibular  PT Next Visit Plan inquire about walking at home to see if they have been able to incorporate this into their schedules; continue to work on  LE strengthening and balance, gait as pt is willing to participate                           Consulted and Agree with Plan of Care Patient;Family member/caregiver  Family Member Consulted Husband Philip          Patient will benefit from skilled therapeutic intervention in order to improve the following deficits and impairments:  Decreased activity tolerance, Decreased balance, Decreased endurance, Decreased mobility, Decreased strength  Visit Diagnosis: Unsteadiness on feet  Muscle weakness (generalized)  Other abnormalities of gait and mobility     Problem List Patient Active Problem List   Diagnosis Date Noted  . History of dehydration 03/26/2017  . Arterial hypotension 03/26/2017  . Syncope and collapse 03/26/2017  . Gait instability 11/13/2016  . Hyperlipidemia 04/17/2011  . Major depression in remission (HCC) 05/23/2010  . Alzheimer's disease 05/23/2010    Sallyanne Kuster, PTA, [facility] [address] [place], [place] [address] [phone] 04/23/17, 9:58 PM   Name: Kim Hernandez MRN: [id number] Date of Birth: 12-18-1940

## 2017-04-27 ENCOUNTER — Other Ambulatory Visit: Payer: Self-pay | Admitting: Neurology

## 2017-04-28 ENCOUNTER — Encounter: Payer: Self-pay | Admitting: Physical Therapy

## 2017-04-28 ENCOUNTER — Ambulatory Visit: Payer: Medicare Other | Admitting: Physical Therapy

## 2017-04-28 DIAGNOSIS — R2689 Other abnormalities of gait and mobility: Secondary | ICD-10-CM

## 2017-04-28 DIAGNOSIS — M6281 Muscle weakness (generalized): Secondary | ICD-10-CM

## 2017-04-28 DIAGNOSIS — R2681 Unsteadiness on feet: Secondary | ICD-10-CM

## 2017-04-28 NOTE — Therapy (Signed)
[facility] [address] [place], [place], [address] Phone: [phone]   Fax:  [phone]  Physical Therapy Treatment  Patient Details  Name: Kim Hernandez MRN: [id number] Date of Birth: 1940/08/30 Referring Provider: Melvyn Novas, MD  Encounter Date: 04/28/2017      PT End of Session - 04/28/17 2153    Visit Number 3   Number of Visits 8   Date for PT Re-Evaluation 06/16/17  only plan to see for 4 weeks   Authorization Type Blue MCR G code on every 10th visit   PT Start Time 1322   PT Stop Time 1400   PT Time Calculation (min) 38 min   Activity Tolerance Patient tolerated treatment well   Behavior During Therapy [facility] for tasks assessed/performed      Past Medical History:  Diagnosis Date  . Alcohol abuse   . Alzheimer's disease 05/23/2010   ?  . ANXIETY DEPRESSION 05/23/2010  . Arthritis   . DEMENTIA 07/20/2008  . Depression    monopolar  . GERD 05/11/2007   ?  Marland Kitchen Hyperlipidemia   . Kidney stone   . Memory loss   . Migraine    pt denies  . OSTEOARTHRITIS, KNEE 05/23/2010  . Osteopenia   . OSTEOPOROSIS 12/17/2007  . Wrist pain, right 04/17/2011    Past Surgical History:  Procedure Laterality Date  . DILATION AND CURETTAGE OF UTERUS     forty years ago  . TONSILLECTOMY    . TOTAL KNEE ARTHROPLASTY Right     There were no vitals filed for this visit.      Subjective Assessment - 04/28/17 1324    Subjective She reports weather has prevented walking program. Husband feels it is she has not bought into PT yet.      Patient is accompained by: Family member   Pertinent History Hx of Alzheimer's disease, hypotension   Limitations House hold activities;Walking   Patient Stated Goals "I want her to strengthen her legs so she can move better." (Per husband)    Currently in Pain? No/denies                         [facility] Adult PT Treatment/Exercise - 04/28/17 1322      High Level  Balance   High Level Balance Activities Side stepping;Braiding;Backward walking;Tandem walking;Marching forwards;Marching backwards;Other (comment)  heel walking & toe walking forward & backward   High Level Balance Comments Tactile & verbal cues for balance reactions                PT Education - 04/28/17 1320    Education provided Yes   Education Details community based exercise programs ([facility] at [facility] near home & [facility] / chair Yoga), LE stretches hamstrings & heelcords   Person(s) Educated Patient;Spouse   Methods Explanation;Demonstration;Tactile cues;Verbal cues;Handout   Comprehension Verbalized understanding;Returned demonstration;Need further instruction          PT Short Term Goals - 04/17/17 1601      PT SHORT TERM GOAL #1   Title =LTGs           PT Long Term Goals - 04/17/17 1601      PT LONG TERM GOAL #1   Title Pt/spouse will be independent with HEP in order to indicate improved functional mobility and balance.     Time 4   Period Weeks   Status New   Target Date  05/17/17     PT LONG TERM GOAL #2   Title Pt will improve DGI to >/=17/24 in order to indicate decreased fall risk.     Time 4   Period Weeks   Status New   Target Date 05/17/17     PT LONG TERM GOAL #3   Title Pt will ambulate up to 1000' over varying outdoor surfaces at distant S level (due to cognition) in order to indicate return to community and leisure activity.     Time 4   Period Weeks   Status New   Target Date 05/17/17     PT LONG TERM GOAL #4   Title Pt/spouse will verbalize return to walking up to 30 mins at a time for continued community fitness once therapy is complete.     Time 4   Period Weeks   Status New   Target Date 05/17/17               Plan - 04/28/17 2154    Clinical Impression Statement Patient's husband reports she may do better with exercises in group setting with other seniors. Pt improved balance reactions with  advanced activities with skilled cues.    Rehab Potential Fair   Clinical Impairments Affecting Rehab Potential hx of Alzheimer's    PT Frequency 2x / week   PT Duration 4 weeks   PT Treatment/Interventions ADLs/Self Care Home Management;Gait training;Stair training;Functional mobility training;Therapeutic activities;Therapeutic exercise;Balance training;Neuromuscular re-education;Patient/family education;Vestibular   PT Next Visit Plan check on community exercise programs,  continue to work on LE strengthening and balance, gait as pt is willing to participate                            Consulted and Agree with Plan of Care Patient;Family member/caregiver   Family Member Consulted Husband Philip      Patient will benefit from skilled therapeutic intervention in order to improve the following deficits and impairments:  Decreased activity tolerance, Decreased balance, Decreased endurance, Decreased mobility, Decreased strength  Visit Diagnosis: Unsteadiness on feet  Muscle weakness (generalized)  Other abnormalities of gait and mobility     Problem List Patient Active Problem List   Diagnosis Date Noted  . History of dehydration 03/26/2017  . Arterial hypotension 03/26/2017  . Syncope and collapse 03/26/2017  . Gait instability 11/13/2016  . Hyperlipidemia 04/17/2011  . Major depression in remission (HCC) 05/23/2010  . Alzheimer's disease 05/23/2010    Vladimir FasterWALDRON,Braelee Herrle PT, DPT 04/28/2017, 10:06 PM  [facility] [address] [place], [place], [address] Phone: [phone]   Fax:  [phone]  Name: Kim Hernandez MRN: [id number] Date of Birth: 05/22/1941

## 2017-04-28 NOTE — Patient Instructions (Addendum)
Chair Sitting    Sit at edge of seat, spine straight, one leg extended. Put a hand on each thigh and bend forward from the hip, keeping spine straight. Allow hand on extended leg to reach toward toes. Support upper body with other arm. Hold _30_ seconds. Repeat __1-2_ times on each leg per session. Do _1-2_ sessions per day.  Copyright  VHI. All rights reserved.  Gastroc / Heel Cord Stretch - Seated With Towel    Sit with leg out straight with heel on floor, towel around ball of foot. Gently pull foot in toward body, stretching heel cord and calf. Hold for __30_ seconds. Repeat on involved leg. Repeat _1-2__ times on each leg. Do _1-2__ times per day.  Copyright  VHI. All rights reserved.   Combine the 2 exercises above. 1. Pull foot up with towel or sheet. 2. Lean trunk forward. 3. Hold 30 seconds. Repeat 1-2 times on each leg.

## 2017-05-01 ENCOUNTER — Encounter: Payer: Self-pay | Admitting: Rehabilitation

## 2017-05-01 ENCOUNTER — Ambulatory Visit: Payer: Medicare Other | Admitting: Rehabilitation

## 2017-05-01 DIAGNOSIS — R2689 Other abnormalities of gait and mobility: Secondary | ICD-10-CM | POA: Diagnosis not present

## 2017-05-01 DIAGNOSIS — M6281 Muscle weakness (generalized): Secondary | ICD-10-CM

## 2017-05-01 DIAGNOSIS — R2681 Unsteadiness on feet: Secondary | ICD-10-CM

## 2017-05-01 NOTE — Therapy (Signed)
[facility] [address] [place], [place], [address] Phone: [phone]   Fax:  [phone]  Physical Therapy Treatment  Patient Details  Name: Kim Hernandez MRN: [id number] Date of Birth: 1940-09-27 Referring Provider: Melvyn Novas, MD  Encounter Date: 05/01/2017      PT End of Session - 05/01/17 1322    Visit Number 4   Number of Visits 8   Date for PT Re-Evaluation 06/16/17  only plan to see for 4 weeks   Authorization Type Blue MCR G code on every 10th visit   PT Start Time 1318   PT Stop Time 1400   PT Time Calculation (min) 42 min   Activity Tolerance Patient tolerated treatment well   Behavior During Therapy [facility] for tasks assessed/performed      Past Medical History:  Diagnosis Date  . Alcohol abuse   . Alzheimer's disease 05/23/2010   ?  . ANXIETY DEPRESSION 05/23/2010  . Arthritis   . DEMENTIA 07/20/2008  . Depression    monopolar  . GERD 05/11/2007   ?  Marland Kitchen Hyperlipidemia   . Kidney stone   . Memory loss   . Migraine    pt denies  . OSTEOARTHRITIS, KNEE 05/23/2010  . Osteopenia   . OSTEOPOROSIS 12/17/2007  . Wrist pain, right 04/17/2011    Past Surgical History:  Procedure Laterality Date  . DILATION AND CURETTAGE OF UTERUS     forty years ago  . TONSILLECTOMY    . TOTAL KNEE ARTHROPLASTY Right     There were no vitals filed for this visit.      Subjective Assessment - 05/01/17 1321    Subjective Pt and husband report that pt has began working out more at home and is motivated to do this so that she doesn't have to continue PT.    Patient is accompained by: Family member   Pertinent History Hx of Alzheimer's disease, hypotension   Limitations House hold activities;Walking   Patient Stated Goals "I want her to strengthen her legs so she can move better." (Per husband)    Currently in Pain? No/denies                 TE and NMR:  Went through current HEP and  updated/modified as needed to increase difficulty.  Added two corner balance tasks to challenge vestibular system.                  PT Education - 05/01/17 1322    Education provided Yes   Education Details Discussion of coming to therapy for at least one more week and next Thursday checking HEP.  If needing to update, can do, and work towards D/C if they agree.  Updated HEP   Person(s) Educated Patient;Spouse   Methods Explanation;Demonstration;Handout   Comprehension Verbalized understanding;Returned demonstration          PT Short Term Goals - 04/17/17 1601      PT SHORT TERM GOAL #1   Title =LTGs           PT Long Term Goals - 04/17/17 1601      PT LONG TERM GOAL #1   Title Pt/spouse will be independent with HEP in order to indicate improved functional mobility and balance.     Time 4   Period Weeks   Status New   Target Date 05/17/17     PT LONG TERM GOAL #2   Title Pt will improve DGI to >/=17/24  in order to indicate decreased fall risk.     Time 4   Period Weeks   Status New   Target Date 05/17/17     PT LONG TERM GOAL #3   Title Pt will ambulate up to 1000' over varying outdoor surfaces at distant S level (due to cognition) in order to indicate return to community and leisure activity.     Time 4   Period Weeks   Status New   Target Date 05/17/17     PT LONG TERM GOAL #4   Title Pt/spouse will verbalize return to walking up to 30 mins at a time for continued community fitness once therapy is complete.     Time 4   Period Weeks   Status New   Target Date 05/17/17               Plan - 05/01/17 1322    Clinical Impression Statement Skilled session went over current HEP, updated and modified to increase difficulty.  Also discussed doing exercises for one week and will re-check to work towards D/C.     Rehab Potential Fair   Clinical Impairments Affecting Rehab Potential hx of Alzheimer's    PT Frequency 2x / week   PT Duration 4 weeks    PT Treatment/Interventions ADLs/Self Care Home Management;Gait training;Stair training;Functional mobility training;Therapeutic activities;Therapeutic exercise;Balance training;Neuromuscular re-education;Patient/family education;Vestibular   PT Next Visit Plan Check HEP on Thursday 9/20 and work towards DC.  continue to work on LE strengthening and balance, gait as pt is willing to participate                            Consulted and Agree with Plan of Care Patient;Family member/caregiver   Family Member Consulted Husband Philip      Patient will benefit from skilled therapeutic intervention in order to improve the following deficits and impairments:  Decreased activity tolerance, Decreased balance, Decreased endurance, Decreased mobility, Decreased strength  Visit Diagnosis: Unsteadiness on feet  Muscle weakness (generalized)  Other abnormalities of gait and mobility     Problem List Patient Active Problem List   Diagnosis Date Noted  . History of dehydration 03/26/2017  . Arterial hypotension 03/26/2017  . Syncope and collapse 03/26/2017  . Gait instability 11/13/2016  . Hyperlipidemia 04/17/2011  . Major depression in remission (HCC) 05/23/2010  . Alzheimer's disease 05/23/2010    Harriet ButteEmily Raoul Ciano, PT, MPT [facility] [address] [place], [place], [address] Phone: [phone]   Fax:  [phone] 05/01/17, 7:25 PM  Name: Kim Hernandez MRN: [id number] Date of Birth: 06/23/1941

## 2017-05-01 NOTE — Patient Instructions (Addendum)
Functional Quadriceps: Sit to Stand    Sit on edge of chair, feet flat on floor and arms across chest. Stand upright, extending knees fully, then slowly sit back down.  If the chair you are doing this from at home is easy, find a lower surface (we used 14" in clinic so anywhere from 14-16" would be great).  Repeat _10___ times per set. Do __1__ sessions per day.  http://orth.exer.us/735   Copyright  VHI. All rights reserved.   Perform these at counter top so to hold for balance as needed:  Heel Raises    Stand with support but only use counter top if you need it,riase heels up into air standing on toes. Hold this position for 5 seconds, then slowly lower heels down.  Repeat __10_ times. Do __1_ times a day.  Copyright  VHI. All rights reserved.  Band Walk: Side Stepping    Tie band around legs, just above knees. Step side to side along the counter top.   Hold on as little as possible to increase balance challenge.  Stand tall.  Repeat x 3 laps down and back.    Note: Small towel between band and skin eases rubbing.  http://plyo.exer.us/76   Copyright  VHI. All rights reserved.       Copyright  VHI. All rights reserved.    "I love a Engineering geologistarade" Lift    Holding on with one hand only: slowly march in place with yellow band tied around your feet (have Phil help you place the band).   Repeat _3_ laps each way. Do _1-2_ sessions per day.  http://gt2.exer.us/345   Copyright  VHI. All rights reserved.    Feet Heel-Toe "Tandem"    Arms at sides/holding counter for support/balance:  walk a straight line forward by brining one foot directly in front of the other and then walk a straight line backwards by bringing one foot directly behind the other one. Repeat for 3 laps each way. Do __1-2__ sessions per day.    For these exercises:  Stand in a corner with chair in front of you.  Pillow should be just thick enough to not feel the ground.   Feet Apart  (Compliant Surface) Varied Arm Positions - Eyes Closed    Stand on compliant surface: ___pillow_____ with feet shoulder width apart and arms by your side. Close eyes and visualize upright position. Hold__20__ seconds. Repeat __3_ times per session. Do _2__ sessions per day.  Copyright  VHI. All rights reserved.   Feet Together (Compliant Surface) Varied Arm Positions - Eyes Open    With eyes open, standing on compliant surface: __pillow_____, feet together and arms by your side, look at a stationary object. Hold __20__ seconds. Repeat __3__ times per session. Do __2__ sessions per day.  Copyright  VHI. All rights reserved.

## 2017-05-05 ENCOUNTER — Encounter: Payer: Self-pay | Admitting: Rehabilitation

## 2017-05-05 ENCOUNTER — Ambulatory Visit: Payer: Medicare Other | Admitting: Rehabilitation

## 2017-05-05 DIAGNOSIS — M6281 Muscle weakness (generalized): Secondary | ICD-10-CM

## 2017-05-05 DIAGNOSIS — R2681 Unsteadiness on feet: Secondary | ICD-10-CM | POA: Diagnosis not present

## 2017-05-05 DIAGNOSIS — R2689 Other abnormalities of gait and mobility: Secondary | ICD-10-CM

## 2017-05-05 NOTE — Therapy (Signed)
[facility] [address] [place], [place], [address] Phone: [phone]   Fax:  [phone]  Physical Therapy Treatment  Patient Details  Name: Kim Hernandez MRN: [id number] Date of Birth: 05-07-1941 Referring Provider: Melvyn Novas, MD  Encounter Date: 05/05/2017      PT End of Session - 05/05/17 1323    Visit Number 5   Number of Visits 8   Date for PT Re-Evaluation 06/16/17  only plan to see for 4 weeks   Authorization Type Blue MCR G code on every 10th visit   PT Start Time 1317   PT Stop Time 1401   PT Time Calculation (min) 44 min   Activity Tolerance Patient tolerated treatment well   Behavior During Therapy [facility] for tasks assessed/performed      Past Medical History:  Diagnosis Date  . Alcohol abuse   . Alzheimer's disease 05/23/2010   ?  . ANXIETY DEPRESSION 05/23/2010  . Arthritis   . DEMENTIA 07/20/2008  . Depression    monopolar  . GERD 05/11/2007   ?  Marland Kitchen Hyperlipidemia   . Kidney stone   . Memory loss   . Migraine    pt denies  . OSTEOARTHRITIS, KNEE 05/23/2010  . Osteopenia   . OSTEOPOROSIS 12/17/2007  . Wrist pain, right 04/17/2011    Past Surgical History:  Procedure Laterality Date  . DILATION AND CURETTAGE OF UTERUS     forty years ago  . TONSILLECTOMY    . TOTAL KNEE ARTHROPLASTY Right     There were no vitals filed for this visit.      Subjective Assessment - 05/05/17 1322    Subjective Pt and husband continue to report that exercises are going well at home.    Patient is accompained by: Family member   Pertinent History Hx of Alzheimer's disease, hypotension   Limitations House hold activities;Walking   Patient Stated Goals "I want her to strengthen her legs so she can move better." (Per husband)    Currently in Pain? No/denies                         [facility] Adult PT Treatment/Exercise - 05/05/17 0001      Self-Care   Self-Care Other Self-Care  Comments   Other Self-Care Comments  Discussed possible D/C from therapy following Thursdays visit if they feel that exercises are going well and PT can make updates/increase challenges within session so that they may progress at home.  Pt even stated at one point that she would be willing to visit gym with husband so that they could work out together.  Will let them discuss this more, but both verbalized understanding.       Neuro Re-ed    Neuro Re-ed Details  High level balance challenges in // bars standing on foam balance beam facing forward maintaining balance x 2 sets of 30 secs.  Progressed to performing head turns up/down and side/side x 10 reps each.  Max cues for turning head only and not body.  Standing on foam balance beam in tandem stance x 30 secs each way.  Progressed to standing on rocker board forward facing holding balance x 30 secs progressing to head turns up/down and side/side x 10 reps each.  Intermittent UE support as needed.  Cone tapping alternating LEs moving laterally to next cone.  Pt requires mod cues throughout to perform tapping very lightly as to increase balance challenge.  Progressed to tipping cone over and back upright, again mod cues from a cognitive standpoint, but requires min/guard to min A to complete task.       Exercises   Exercises Other Exercises   Other Exercises  Seated nustep x 5 mins at level 4 resistance with BUEs/LEs maintaining steps per minute between 50-60 to encourage improved cardiovascular endurance.  Pt tolerated well.                  PT Education - 05/05/17 1323    Education provided Yes   Education Details Discussion of possible D/C on Thursday following PT if they feel HEP is adequate.    Person(s) Educated Patient   Methods Explanation   Comprehension Verbalized understanding          PT Short Term Goals - 04/17/17 1601      PT SHORT TERM GOAL #1   Title =LTGs           PT Long Term Goals - 04/17/17 1601      PT  LONG TERM GOAL #1   Title Pt/spouse will be independent with HEP in order to indicate improved functional mobility and balance.     Time 4   Period Weeks   Status New   Target Date 05/17/17     PT LONG TERM GOAL #2   Title Pt will improve DGI to >/=17/24 in order to indicate decreased fall risk.     Time 4   Period Weeks   Status New   Target Date 05/17/17     PT LONG TERM GOAL #3   Title Pt will ambulate up to 1000' over varying outdoor surfaces at distant S level (due to cognition) in order to indicate return to community and leisure activity.     Time 4   Period Weeks   Status New   Target Date 05/17/17     PT LONG TERM GOAL #4   Title Pt/spouse will verbalize return to walking up to 30 mins at a time for continued community fitness once therapy is complete.     Time 4   Period Weeks   Status New   Target Date 05/17/17               Plan - 05/05/17 1601    Clinical Impression Statement Skilled session focused on high level balance, vestibular challenges, and SLS.  Pt tolerated very well just needs increased cuing at time due to cognitive deficits.     Rehab Potential Fair   Clinical Impairments Affecting Rehab Potential hx of Alzheimer's    PT Frequency 2x / week   PT Duration 4 weeks   PT Treatment/Interventions ADLs/Self Care Home Management;Gait training;Stair training;Functional mobility training;Therapeutic activities;Therapeutic exercise;Balance training;Neuromuscular re-education;Patient/family education;Vestibular   PT Next Visit Plan Check HEP on Thursday 9/20 and work towards DC if they are okay with this, if not continue next week.  continue to work on LE strengthening and balance, gait as pt is willing to participate                            Consulted and Agree with Plan of Care Patient;Family member/caregiver   Family Member Consulted Husband Philip      Patient will benefit from skilled therapeutic intervention in order to improve the following  deficits and impairments:  Decreased activity tolerance, Decreased balance, Decreased endurance, Decreased mobility, Decreased strength  Visit Diagnosis: Unsteadiness on feet  Muscle weakness (generalized)  Other abnormalities of gait and mobility     Problem List Patient Active Problem List   Diagnosis Date Noted  . History of dehydration 03/26/2017  . Arterial hypotension 03/26/2017  . Syncope and collapse 03/26/2017  . Gait instability 11/13/2016  . Hyperlipidemia 04/17/2011  . Major depression in remission (HCC) 05/23/2010  . Alzheimer's disease 05/23/2010    Harriet Butte, PT, MPT [facility] [address] [place], [place], [address] Phone: [phone]   Fax:  [phone] 05/05/17, 4:06 PM  Name: TYNIESHA HOWALD MRN: [id number] Date of Birth: 05-Jan-1941

## 2017-05-08 ENCOUNTER — Ambulatory Visit: Payer: Medicare Other | Admitting: Physical Therapy

## 2017-05-09 ENCOUNTER — Ambulatory Visit: Payer: Medicare Other | Admitting: Rehabilitation

## 2017-05-09 ENCOUNTER — Encounter: Payer: Self-pay | Admitting: Rehabilitation

## 2017-05-09 DIAGNOSIS — R2681 Unsteadiness on feet: Secondary | ICD-10-CM | POA: Diagnosis not present

## 2017-05-09 DIAGNOSIS — M6281 Muscle weakness (generalized): Secondary | ICD-10-CM | POA: Diagnosis not present

## 2017-05-09 DIAGNOSIS — R2689 Other abnormalities of gait and mobility: Secondary | ICD-10-CM

## 2017-05-09 NOTE — Patient Instructions (Addendum)
Functional Quadriceps: Sit to Stand    Sit on edge of chair, feet flat on floor and arms across chest. Stand upright, extending knees fully, then slowly sit back down.  If the chair you are doing this from at home is easy, find a lower surface (we used 14" in clinic so anywhere from 14-16" would be great).  Repeat _10___ times per set. Do __1__ sessions per day.  http://orth.exer.us/735  Copyright  VHI. All rights reserved.  Perform these at counter top so to hold for balance as needed:   Copyright  VHI. All rights reserved.       Hip Abduction with Band  Start in slight squat position with feet pointing straight forward.  Step sideways, slowly bringing feet away from each other.   Slowly return to start position.  Take 10 steps each direction or along the counter top x 3 reps.       http://plyo.exer.us/76     Copyright  VHI. All rights reserved.       Copyright  VHI. All rights reserved.   "I love a Engineering geologist on with one hand only: slowly march in place with yellow band tied around your feet (have Phil help you place the band).   Repeat _3_ laps each way. Do _1-2_ sessions per day.  http://gt2.exer.us/345  Copyright  VHI. All rights reserved.   Feet Heel-Toe "Tandem"    Arms at sides/holding counter for support/balance: walk a straight line forward by brining one foot directly in front of the other and then walk a straight line backwards by bringing one foot directly behind the other one. Repeat for 3 laps each way. Do __1-2__ sessions per day.   For these exercises:  Stand in a corner with chair in front of you.  Pillow should be just thick enough to not feel the ground.   Feet Together (Compliant Surface) Varied Arm Positions - Eyes Closed    Stand on compliant surface: __pillow______ with feet together and arms by your side. Close eyes and visualize upright position. Hold___30_ seconds. Repeat __3__  times per session. Do _1-2___ sessions per day.  Copyright  VHI. All rights reserved.   Once the exercise above gets easier, add head turns as exercise states below:   Feet Together (Compliant Surface) Head Motion - Eyes Closed    Stand on compliant surface: ___pillow _____ with feet together. Close eyes and move head slowly, up and down x 10 reps, and side to side x 10 reps  Repeat ___1_ times per session. Do _1-2___ sessions per day.  Copyright  VHI. All rights reserved.

## 2017-05-09 NOTE — Therapy (Signed)
[facility] [address] [place], [place], [address] Phone: [phone]   Fax:  [phone]  Physical Therapy Treatment and D/C Summary  Patient Details  Name: Kim Hernandez MRN: [id number] Date of Birth: [birth year] Referring Provider: Larey Seat, MD  Encounter Date: 05/09/2017      PT End of Session - 05/09/17 1650    Visit Number 6   Number of Visits 8   Date for PT Re-Evaluation 06/16/17  only plan to see for 4 weeks   Authorization Type Blue MCR G code on every 10th visit   PT Start Time 1530   PT Stop Time 1615   PT Time Calculation (min) 45 min   Activity Tolerance Patient tolerated treatment well   Behavior During Therapy [facility] for tasks assessed/performed      Past Medical History:  Diagnosis Date  . Alcohol abuse   . Alzheimer's disease 05/23/2010   ?  . ANXIETY DEPRESSION 05/23/2010  . Arthritis   . DEMENTIA 07/20/2008  . Depression    monopolar  . GERD 05/11/2007   ?  Marland Kitchen Hyperlipidemia   . Kidney stone   . Memory loss   . Migraine    pt denies  . OSTEOARTHRITIS, KNEE 05/23/2010  . Osteopenia   . OSTEOPOROSIS 12/17/2007  . Wrist pain, right 04/17/2011    Past Surgical History:  Procedure Laterality Date  . DILATION AND CURETTAGE OF UTERUS     forty years ago  . TONSILLECTOMY    . TOTAL KNEE ARTHROPLASTY Right     There were no vitals filed for this visit.      Subjective Assessment - 05/09/17 1536    Subjective Pts husband reports that he was busy this week and therefore has not been performing exercises with pt regularly.    Patient is accompained by: Family member   Pertinent History Hx of Alzheimer's disease, hypotension   Limitations House hold activities;Walking   Patient Stated Goals "I want her to strengthen her legs so she can move better." (Per husband)    Currently in Pain? No/denies                         [facility] Adult PT Treatment/Exercise - 05/09/17  1607      Standardized Balance Assessment   Standardized Balance Assessment Dynamic Gait Index     Dynamic Gait Index   Level Surface Normal   Change in Gait Speed Mild Impairment   Gait with Horizontal Head Turns Mild Impairment   Gait with Vertical Head Turns Mild Impairment   Gait and Pivot Turn Mild Impairment   Step Over Obstacle Normal   Step Around Obstacles Mild Impairment   Steps Mild Impairment   Total Score 18     Self-Care   Self-Care Other Self-Care Comments   Other Self-Care Comments  Discussed returning to community fitness with walking outdoors as weather allows and indoors at rec center.  Pts spouse with questions regarding how long to walk.  Recommend she begin with 5-10 mins and work up in 2-3 mins from there as she tolerates it.  Also educated on new updated HEP and how her just being more active doing things she enjoys will be most beneficial to her due to cognitive deficits.  Pts spouse states she used to [place] yard however he is fearful of her doing this now.  Recommend that they try her slowly returning to this with small level patch  of yard with S from husband.  Husband states they need to get mower fixed.  Also states she likes to rake leaves and will be able to do this soon as fall comes.  PT also recommends this.       Neuro Re-ed    Neuro Re-ed Details  See pt instruction for updated corner balance exercises.      Exercises   Exercises Other Exercises   Other Exercises  See pt instruction for updated list of exercises.                  PT Education - 05/09/17 1650    Education provided Yes   Education Details see self care, updated HEP   Person(s) Educated Patient;Spouse   Methods Demonstration;Explanation;Handout   Comprehension Returned demonstration;Verbalized understanding          PT Short Term Goals - 04/17/17 1601      PT SHORT TERM GOAL #1   Title =LTGs           PT Long Term Goals - 05/09/17 1601      PT LONG TERM GOAL #1    Title Pt/spouse will be independent with HEP in order to indicate improved functional mobility and balance.     Baseline met (PT updated 05/09/17)   Time 4   Period Weeks   Status Achieved     PT LONG TERM GOAL #2   Title Pt will improve DGI to >/=17/24 in order to indicate decreased fall risk.     Baseline 18/24 on 05/09/17   Time 4   Period Weeks   Status Achieved     PT LONG TERM GOAL #3   Title Pt will ambulate up to 1000' over varying outdoor surfaces at distant S level (due to cognition) in order to indicate return to community and leisure activity.     Baseline Met per husband report, did not have time to formally address   Time 4   Period Weeks   Status Achieved     PT LONG TERM GOAL #4   Title Pt/spouse will verbalize return to walking up to 30 mins at a time for continued community fitness once therapy is complete.     Baseline They plan to return to walking next week.    Time 4   Period Weeks   Status Not Met               Plan - 05/09/17 1650    Clinical Impression Statement Skilled session focused on going over HEP and updated to make appropriate and assessing LTGs for D/C.  Pt has met 3/4 LTGs and is ready for D/C.  Pt and husband agree to D/C and feel that HEP is adequate and will begin walking and perhaps going to gym.     Rehab Potential Fair   Clinical Impairments Affecting Rehab Potential hx of Alzheimer's    PT Frequency 2x / week   PT Duration 4 weeks   PT Treatment/Interventions ADLs/Self Care Home Management;Gait training;Stair training;Functional mobility training;Therapeutic activities;Therapeutic exercise;Balance training;Neuromuscular re-education;Patient/family education;Vestibular   PT Next Visit Plan D/C   Consulted and Agree with Plan of Care Patient;Family member/caregiver   Family Member Consulted Husband Philip      Patient will benefit from skilled therapeutic intervention in order to improve the following deficits and impairments:   Decreased activity tolerance, Decreased balance, Decreased endurance, Decreased mobility, Decreased strength  Visit Diagnosis: Unsteadiness on feet  Muscle weakness (generalized)  Other  abnormalities of gait and mobility       G-Codes - 06/06/2017 1653    Functional Assessment Tool Used (Outpatient Only) DGI: 18/24   Functional Limitation Mobility: Walking and moving around   Mobility: Walking and Moving Around Current Status [phone]) At least 20 percent but less than 40 percent impaired, limited or restricted   Mobility: Walking and Moving Around Goal Status [phone]) At least 20 percent but less than 40 percent impaired, limited or restricted   Mobility: Walking and Moving Around Discharge Status [phone]) At least 20 percent but less than 40 percent impaired, limited or restricted       PHYSICAL THERAPY DISCHARGE SUMMARY  Visits from Start of Care: 6  Current functional level related to goals / functional outcomes: See LTGs above   Remaining deficits: Pt continues to have balance and gait deficits, has HEP to address and education on how husband can assist with cuing.     Education / Equipment: HEP  Plan: Patient agrees to discharge.  Patient goals were partially met. Patient is being discharged due to meeting the stated rehab goals.  ?????         Problem List Patient Active Problem List   Diagnosis Date Noted  . History of dehydration 03/26/2017  . Arterial hypotension 03/26/2017  . Syncope and collapse 03/26/2017  . Gait instability 11/13/2016  . Hyperlipidemia 04/17/2011  . Major depression in remission ([place]) 05/23/2010  . Alzheimer's disease 05/23/2010    Cameron Sprang, PT, MPT [facility] [address] [place], [place], [address] Phone: [phone]   Fax:  [phone] 2017/06/06, 4:55 PM  Name: Kim Hernandez MRN: [id number] Date of Birth: 05/24/1941

## 2017-05-12 ENCOUNTER — Ambulatory Visit: Payer: Medicare Other | Admitting: Rehabilitation

## 2017-05-15 ENCOUNTER — Ambulatory Visit: Payer: Self-pay | Admitting: Rehabilitation

## 2017-06-01 ENCOUNTER — Emergency Department (HOSPITAL_COMMUNITY): Payer: Medicare Other

## 2017-06-01 ENCOUNTER — Encounter (HOSPITAL_COMMUNITY): Payer: Self-pay | Admitting: *Deleted

## 2017-06-01 ENCOUNTER — Emergency Department (HOSPITAL_COMMUNITY)
Admission: EM | Admit: 2017-06-01 | Discharge: 2017-06-01 | Disposition: A | Discharge status: Home / Self Care | Payer: Medicare Other | Attending: Emergency Medicine | Admitting: Emergency Medicine

## 2017-06-01 DIAGNOSIS — R079 Chest pain, unspecified: Secondary | ICD-10-CM | POA: Diagnosis not present

## 2017-06-01 DIAGNOSIS — F0280 Dementia in other diseases classified elsewhere without behavioral disturbance: Secondary | ICD-10-CM | POA: Diagnosis not present

## 2017-06-01 DIAGNOSIS — Y939 Activity, unspecified: Secondary | ICD-10-CM | POA: Insufficient documentation

## 2017-06-01 DIAGNOSIS — S2231XA Fracture of one rib, right side, initial encounter for closed fracture: Secondary | ICD-10-CM | POA: Insufficient documentation

## 2017-06-01 DIAGNOSIS — S299XXA Unspecified injury of thorax, initial encounter: Secondary | ICD-10-CM | POA: Diagnosis present

## 2017-06-01 DIAGNOSIS — Z79899 Other long term (current) drug therapy: Secondary | ICD-10-CM | POA: Diagnosis not present

## 2017-06-01 DIAGNOSIS — W19XXXA Unspecified fall, initial encounter: Secondary | ICD-10-CM | POA: Diagnosis not present

## 2017-06-01 DIAGNOSIS — Y999 Unspecified external cause status: Secondary | ICD-10-CM | POA: Diagnosis not present

## 2017-06-01 DIAGNOSIS — G309 Alzheimer's disease, unspecified: Secondary | ICD-10-CM | POA: Diagnosis not present

## 2017-06-01 DIAGNOSIS — Y929 Unspecified place or not applicable: Secondary | ICD-10-CM | POA: Diagnosis not present

## 2017-06-01 NOTE — ED Triage Notes (Addendum)
Pt fell on Friday and it was witnessed by husband that she landed on her right side and she continues to have times when she screams out in pain with movement to her right rib area.  Pt has dementia and is with her husband in the waiting room.

## 2017-06-01 NOTE — Discharge Instructions (Signed)
For pain, you can use Tylenol 650 mg every 4 hours, or 1000 mg every 6 hours, as needed for pain.  The rib fracture will likely take 6 weeks to heal.

## 2017-06-01 NOTE — ED Provider Notes (Signed)
[facility]-EMERGENCY DEPT Provider Note   CSN: [id number] Arrival date & time: 06/01/17  4098     History   Chief Complaint Chief Complaint  Patient presents with  . Fall  . Chest Pain    HPI Kim Hernandez is a 76 y.o. female.  She presents for examination of right anterior chest pain, since falling, yesterday.  Pain is persistent but occurs only with movement or palpation.  There is been no complaint of shortness of breath, nausea, vomiting, fever, chills, weakness or dizziness.  The patient is unable to give history.  Husband describes patient tripping and falling with her right arm against her chest, the day prior to evaluation.  Level 5 caveat-dementia  HPI  Past Medical History:  Diagnosis Date  . Alcohol abuse   . Alzheimer's disease 05/23/2010   ?  . ANXIETY DEPRESSION 05/23/2010  . Arthritis   . DEMENTIA 07/20/2008  . Depression    monopolar  . GERD 05/11/2007   ?  Marland Kitchen Hyperlipidemia   . Kidney stone   . Memory loss   . Migraine    pt denies  . OSTEOARTHRITIS, KNEE 05/23/2010  . Osteopenia   . OSTEOPOROSIS 12/17/2007  . Wrist pain, right 04/17/2011    Patient Active Problem List   Diagnosis Date Noted  . History of dehydration 03/26/2017  . Arterial hypotension 03/26/2017  . Syncope and collapse 03/26/2017  . Gait instability 11/13/2016  . Hyperlipidemia 04/17/2011  . Major depression in remission ([facility]) 05/23/2010  . Alzheimer's disease 05/23/2010    Past Surgical History:  Procedure Laterality Date  . DILATION AND CURETTAGE OF UTERUS     forty years ago  . TONSILLECTOMY    . TOTAL KNEE ARTHROPLASTY Right     OB History    No data available       Home Medications    Prior to Admission medications   Medication Sig Start Date End Date Taking? Authorizing Provider  atorvastatin (LIPITOR) 40 MG tablet TAKE 1 TABLET(40 MG) BY MOUTH DAILY 10/28/16  Yes Myrlene Broker, MD  Cholecalciferol (VITAMIN D3) 1000 UNITS CAPS Take 1 capsule by mouth  daily.     Yes [provider]  citalopram (CELEXA) 20 MG tablet Take 1 tablet (20 mg total) by mouth daily. 10/30/15  Yes Myrlene Broker, MD  donepezil (ARICEPT) 10 MG tablet Take 1 tablet (10 mg total) by mouth at bedtime. 01/06/17  Yes Dohmeier, Porfirio Mylar, MD  fludrocortisone (FLORINEF) 0.1 MG tablet TK 1 T PO D 10/17/16  Yes [provider]  memantine (NAMENDA XR) 28 MG CP24 24 hr capsule TAKE 1 CAPSULE(28 MG) BY MOUTH DAILY 04/28/17  Yes Dohmeier, Porfirio Mylar, MD  vitamin B-12 (CYANOCOBALAMIN) 500 MCG tablet Take 500 mcg by mouth daily.   Yes [provider]    Family History Family History  Problem Relation Age of Onset  . Anemia Mother   . Alzheimer's disease Mother   . Emphysema Mother        smoker  . Obesity Sister   . Emphysema Sister        smoker  . Coronary artery disease Brother        smoker  . Heart attack Brother   . Obesity Daughter   . Depression Daughter   . Alcohol abuse Son   . Depression Son   . Suicidality Son        10/12-commited suicide in a manic depressive episode    Social History Social History  Substance Use Topics  . Smoking status: Never Smoker  . Smokeless tobacco: Never Used  . Alcohol use Yes     Comment: quit- had a problem w/ alcohol and has abstained for years     Allergies   Poison ivy extract   Review of Systems Review of Systems  Unable to perform ROS: Dementia     Physical Exam Updated Vital Signs BP (!) 155/81   Pulse (!) 59   Temp 98 F (36.7 C) (Oral)   Resp 16   SpO2 98%   Physical Exam  Constitutional: She appears well-developed.  Elderly, frail  HENT:  Head: Normocephalic and atraumatic.  Eyes: Pupils are equal, round, and reactive to light. Conjunctivae and EOM are normal.  Neck: Normal range of motion and phonation normal. Neck supple.  Cardiovascular: Normal rate and regular rhythm.   Pulmonary/Chest: Effort normal and breath sounds normal. She exhibits no tenderness (Right  anterior chest wall tenderness without crepitation or deformity).  Abdominal: Soft. She exhibits no distension. There is no tenderness. There is no guarding.  Musculoskeletal: Normal range of motion.  Neurological: She is alert. She exhibits normal muscle tone.  Skin: Skin is warm and dry.  Psychiatric: She has a normal mood and affect. Her behavior is normal.  Nursing note and vitals reviewed.    ED Treatments / Results  Labs (all labs ordered are listed, but only abnormal results are displayed) Labs Reviewed - No data to display  EKG  EKG Interpretation None       Radiology Dg Chest 2 View  Result Date: 06/01/2017 CLINICAL DATA:  Fall.  Right-sided chest pain.  Initial encounter. EXAM: CHEST  2 VIEW COMPARISON:  None. FINDINGS: The heart size and mediastinal contours are within normal limits. There is no evidence of pulmonary edema, consolidation, pneumothorax, nodule or pleural fluid. There may be a subtle lateral right-sided rib fracture roughly at the level of the sixth rib. Oblique right rib films may be helpful. IMPRESSION: Possible subtle right-sided rib fracture around the level of the right lateral sixth rib. No evidence of pneumothorax or other acute pulmonary findings. Electronically Signed   By: Irish Lack M.D.   On: 06/01/2017 10:59    Procedures Procedures (including critical care time)  Medications Ordered in ED Medications - No data to display   Initial Impression / Assessment and Plan / ED Course  I have reviewed the triage vital signs and the nursing notes.  Pertinent labs & imaging results that were available during my care of the patient were reviewed by me and considered in my medical decision making (see chart for details).      Patient Vitals for the past 24 hrs:  BP Pulse Resp SpO2  06/01/17 1345 (!) 155/81 (!) 59 16 98 %  06/01/17 1330 (!) 158/81 (!) 58 16 98 %  06/01/17 1315 (!) 147/85 (!) 58 16 98 %  06/01/17 1215 (!) 143/81 60 16 98 %     At discharge- reevaluation with update and discussion. After initial assessment and treatment, an updated evaluation reveals she remains comfortable and has no further complaints. [name]    Final Clinical Impressions(s) / ED Diagnoses   Final diagnoses:  Fall, initial encounter  Closed fracture of one rib of right side, initial encounter   Rib fracture, secondary to mechanical fall.  No evidence for pulmonary contusion or pneumothorax.  No indication for further evaluation or treatment at this time.  Nursing Notes Reviewed/ Care Coordinated Applicable Imaging  Reviewed Interpretation of Laboratory Data incorporated into ED treatment  The patient appears reasonably screened and/or stabilized for discharge and I doubt any other medical condition or other [facility] requiring further screening, evaluation, or treatment in the ED at this time prior to discharge. OTC analgesia, continue current medications Plan: Home Medications-OTC analgesia, continue current medications; Home Treatments-rest, fluids; return here if the recommended treatment, does not improve the symptoms; Recommended follow up-PCP, as needed   New Prescriptions Discharge Medication List as of 06/01/2017  2:11 PM       Mancel Bale, MD 06/02/17 [phone]

## 2017-06-10 ENCOUNTER — Other Ambulatory Visit: Payer: Self-pay

## 2017-06-10 ENCOUNTER — Telehealth: Payer: Self-pay | Admitting: Gastroenterology

## 2017-06-10 DIAGNOSIS — R197 Diarrhea, unspecified: Secondary | ICD-10-CM

## 2017-06-10 MED ORDER — NA SULFATE-K SULFATE-MG SULF 17.5-3.13-1.6 GM/177ML PO SOLN: 1.0000 | Freq: Once | ORAL | 0 refills | Status: AC

## 2017-06-10 NOTE — Telephone Encounter (Signed)
Is it okay to schedule colonoscopy?

## 2017-06-10 NOTE — Telephone Encounter (Signed)
Spoke to patient's husband, she is scheduled for 07/01/17. I have mailed prep instructions and consent. Instructed to bring consent back on the day of procedure. Patient has alzheimer's.

## 2017-06-10 NOTE — Telephone Encounter (Signed)
Yes that's fine. We have openings next week if she wishes to do it then. Can you schedule her? Thanks

## 2017-06-11 ENCOUNTER — Emergency Department (HOSPITAL_BASED_OUTPATIENT_CLINIC_OR_DEPARTMENT_OTHER)
Admission: EM | Admit: 2017-06-11 | Discharge: 2017-06-11 | Disposition: A | Discharge status: Home / Self Care | Payer: Medicare Other | Attending: Emergency Medicine | Admitting: Emergency Medicine

## 2017-06-11 ENCOUNTER — Encounter (HOSPITAL_BASED_OUTPATIENT_CLINIC_OR_DEPARTMENT_OTHER): Payer: Self-pay | Admitting: *Deleted

## 2017-06-11 DIAGNOSIS — R748 Abnormal levels of other serum enzymes: Secondary | ICD-10-CM | POA: Diagnosis not present

## 2017-06-11 DIAGNOSIS — Z79899 Other long term (current) drug therapy: Secondary | ICD-10-CM | POA: Diagnosis not present

## 2017-06-11 DIAGNOSIS — G309 Alzheimer's disease, unspecified: Secondary | ICD-10-CM | POA: Diagnosis not present

## 2017-06-11 DIAGNOSIS — E876 Hypokalemia: Secondary | ICD-10-CM

## 2017-06-11 DIAGNOSIS — R7401 Elevation of levels of liver transaminase levels: Secondary | ICD-10-CM

## 2017-06-11 DIAGNOSIS — R740 Nonspecific elevation of levels of transaminase and lactic acid dehydrogenase [LDH]: Secondary | ICD-10-CM | POA: Diagnosis not present

## 2017-06-11 DIAGNOSIS — R197 Diarrhea, unspecified: Secondary | ICD-10-CM | POA: Diagnosis not present

## 2017-06-11 DIAGNOSIS — Z96651 Presence of right artificial knee joint: Secondary | ICD-10-CM | POA: Diagnosis not present

## 2017-06-11 LAB — CBC WITH DIFFERENTIAL/PLATELET
Basophils Absolute: 0 10*3/uL (ref 0.0–0.1)
Basophils Relative: 0 %
EOS PCT: 1 %
Eosinophils Absolute: 0 10*3/uL (ref 0.0–0.7)
HCT: 38.4 % (ref 36.0–46.0)
HEMOGLOBIN: 12.8 g/dL (ref 12.0–15.0)
LYMPHS ABS: 0.5 10*3/uL — AB (ref 0.7–4.0)
LYMPHS PCT: 7 %
MCH: 32.1 pg (ref 26.0–34.0)
MCHC: 33.3 g/dL (ref 30.0–36.0)
MCV: 96.2 fL (ref 78.0–100.0)
MONO ABS: 0.8 10*3/uL (ref 0.1–1.0)
MONOS PCT: 11 %
NEUTROS ABS: 5.4 10*3/uL (ref 1.7–7.7)
Neutrophils Relative %: 81 %
Platelets: 216 10*3/uL (ref 150–400)
RBC: 3.99 MIL/uL (ref 3.87–5.11)
RDW: 12.2 % (ref 11.5–15.5)
WBC: 6.7 10*3/uL (ref 4.0–10.5)

## 2017-06-11 LAB — COMPREHENSIVE METABOLIC PANEL
ALBUMIN: 3.7 g/dL (ref 3.5–5.0)
ALK PHOS: 111 U/L (ref 38–126)
ALT: 156 U/L — AB (ref 14–54)
ANION GAP: 8 (ref 5–15)
AST: 196 U/L — AB (ref 15–41)
BUN: 15 mg/dL (ref 6–20)
CO2: 27 mmol/L (ref 22–32)
Calcium: 8.9 mg/dL (ref 8.9–10.3)
Chloride: 102 mmol/L (ref 101–111)
Creatinine, Ser: 0.58 mg/dL (ref 0.44–1.00)
GFR calc Af Amer: >60 mL/min (ref >60)
GFR calc non Af Amer: >60 mL/min (ref >60)
GLUCOSE: 113 mg/dL — AB (ref 65–99)
POTASSIUM: 2.8 mmol/L — AB (ref 3.5–5.1)
SODIUM: 137 mmol/L (ref 135–145)
TOTAL PROTEIN: 7 g/dL (ref 6.5–8.1)
Total Bilirubin: 0.6 mg/dL (ref 0.3–1.2)

## 2017-06-11 LAB — LIPASE, BLOOD: Lipase: 283 U/L — ABNORMAL HIGH (ref 11–51)

## 2017-06-11 MED ORDER — MAGNESIUM 30 MG PO TABS: 30.0000 mg | ORAL_TABLET | Freq: Every day | ORAL | 0 refills | Status: AC

## 2017-06-11 MED ORDER — SODIUM CHLORIDE 0.9 % IV BOLUS (SEPSIS)
1000.0000 mL | Freq: Once | INTRAVENOUS | Status: AC
Start: 2017-06-11 — End: 2017-06-11
  Administered 2017-06-11: 1000 mL via INTRAVENOUS

## 2017-06-11 MED ORDER — POTASSIUM CHLORIDE CRYS ER 20 MEQ PO TBCR: 20.0000 meq | EXTENDED_RELEASE_TABLET | Freq: Two times a day (BID) | ORAL | 0 refills | Status: DC

## 2017-06-11 MED ORDER — POTASSIUM CHLORIDE CRYS ER 20 MEQ PO TBCR
40.0000 meq | EXTENDED_RELEASE_TABLET | Freq: Once | ORAL | Status: AC
  Administered 2017-06-11: 40 meq via ORAL
  Filled 2017-06-11: qty 2

## 2017-06-11 MED FILL — POTASSIUM CL ER 20 MEQ TABL: 20 | 7 days supply | Qty: 14 | Fill #0

## 2017-06-11 NOTE — ED Provider Notes (Signed)
[facility] EMERGENCY DEPARTMENT Provider Note  CSN: [id number] Arrival date & time: 06/11/17 1024  Chief Complaint(s) Diarrhea  HPI Kim Hernandez is a 76 y.o. female   The history is provided by the patient.  Diarrhea   This is a recurrent problem. Episode onset: several months. The problem occurs 2 to 4 times per day. The problem has been gradually improving. The stool consistency is described as watery. There has been no fever. Pertinent negatives include no abdominal pain, no vomiting, no chills, no sweats, no URI and no cough. She has tried anti-motility drugs for the symptoms. The treatment provided moderate relief.    Past Medical History Past Medical History:  Diagnosis Date  . Alcohol abuse   . Alzheimer's disease 05/23/2010   ?  . ANXIETY DEPRESSION 05/23/2010  . Arthritis   . DEMENTIA 07/20/2008  . Depression    monopolar  . GERD 05/11/2007   ?  Marland Kitchen Hyperlipidemia   . Kidney stone   . Memory loss   . Migraine    pt denies  . OSTEOARTHRITIS, KNEE 05/23/2010  . Osteopenia   . OSTEOPOROSIS 12/17/2007  . Wrist pain, right 04/17/2011   Patient Active Problem List   Diagnosis Date Noted  . History of dehydration 03/26/2017  . Arterial hypotension 03/26/2017  . Syncope and collapse 03/26/2017  . Gait instability 11/13/2016  . Hyperlipidemia 04/17/2011  . Major depression in remission ([facility]) 05/23/2010  . Alzheimer's disease 05/23/2010   Home Medication(s) Prior to Admission medications   Medication Sig Start Date End Date Taking? Authorizing Provider  atorvastatin (LIPITOR) 40 MG tablet TAKE 1 TABLET(40 MG) BY MOUTH DAILY 10/28/16   Myrlene Broker, MD  Cholecalciferol (VITAMIN D3) 1000 UNITS CAPS Take 1 capsule by mouth daily.      [provider]  citalopram (CELEXA) 20 MG tablet Take 1 tablet (20 mg total) by mouth daily. 10/30/15   Myrlene Broker, MD  donepezil (ARICEPT) 10 MG tablet Take 1 tablet (10 mg total) by mouth at  bedtime. 01/06/17   Dohmeier, Porfirio Mylar, MD  fludrocortisone (FLORINEF) 0.1 MG tablet TK 1 T PO D 10/17/16   [provider]  memantine (NAMENDA XR) 28 MG CP24 24 hr capsule TAKE 1 CAPSULE(28 MG) BY MOUTH DAILY 04/28/17   Dohmeier, Porfirio Mylar, MD  vitamin B-12 (CYANOCOBALAMIN) 500 MCG tablet Take 500 mcg by mouth daily.    [provider]                                                                                                                                    Past Surgical History Past Surgical History:  Procedure Laterality Date  . DILATION AND CURETTAGE OF UTERUS     forty years ago  . TONSILLECTOMY    . TOTAL KNEE ARTHROPLASTY Right    Family History Family History  Problem Relation Age of Onset  . Anemia Mother   .  Alzheimer's disease Mother   . Emphysema Mother        smoker  . Obesity Sister   . Emphysema Sister        smoker  . Coronary artery disease Brother        smoker  . Heart attack Brother   . Obesity Daughter   . Depression Daughter   . Alcohol abuse Son   . Depression Son   . Suicidality Son        10/12-commited suicide in a manic depressive episode    Social History Social History  Substance Use Topics  . Smoking status: Never Smoker  . Smokeless tobacco: Never Used  . Alcohol use Yes     Comment: quit- had a problem w/ alcohol and has abstained for years   Allergies Poison ivy extract  Review of Systems Review of Systems  Constitutional: Negative for chills.  Respiratory: Negative for cough.   Gastrointestinal: Positive for diarrhea. Negative for abdominal pain and vomiting.   All other systems are reviewed and are negative for acute change except as noted in the HPI  Physical Exam Vital Signs  I have reviewed the triage vital signs BP 139/73 (BP Location: Right Arm)   Pulse 77   Temp 99 F (37.2 C) (Oral)   Resp 18   Ht 5\' 2"  (1.575 m)   SpO2 95%   Physical Exam  Constitutional: She is oriented to person, place, and  time. She appears well-developed and well-nourished. No distress.  HENT:  Head: Normocephalic and atraumatic.  Nose: Nose normal.  Eyes: Pupils are equal, round, and reactive to light. Conjunctivae and EOM are normal. Right eye exhibits no discharge. Left eye exhibits no discharge. No scleral icterus.  Neck: Normal range of motion. Neck supple.  Cardiovascular: Normal rate and regular rhythm.  Exam reveals no gallop and no friction rub.   No murmur heard. Pulmonary/Chest: Effort normal and breath sounds normal. No stridor. No respiratory distress. She has no rales.  Abdominal: Soft. She exhibits no distension. There is no tenderness. There is no rigidity, no rebound, no guarding, no CVA tenderness and negative Murphy's sign.  Musculoskeletal: She exhibits no edema or tenderness.  Neurological: She is alert and oriented to person, place, and time.  Skin: Skin is warm and dry. No rash noted. She is not diaphoretic. No erythema.  Psychiatric: She has a normal mood and affect.  Vitals reviewed.   ED Results and Treatments Labs (all labs ordered are listed, but only abnormal results are displayed) Labs Reviewed - No data to display                                                                                                                       EKG  EKG Interpretation  Date/Time:    Ventricular Rate:    PR Interval:    QRS Duration:   QT Interval:    QTC Calculation:   R Axis:     Text Interpretation:  Radiology No results found. Pertinent labs & imaging results that were available during my care of the patient were reviewed by me and considered in my medical decision making (see chart for details).  Medications Ordered in ED Medications - No data to display                                                                                                                                  Procedures Procedures  (including critical care time)  Medical Decision Making / ED  Course I have reviewed the nursing notes for this encounter and the patient's prior records (if available in EHR or on provided paperwork).     Patient with persistent diarrhea.  No abdominal pain, nausea, vomiting.  Patient is afebrile with stable vital signs.  Well hydrated, well-appearing, nontoxic.  Labs grossly reassuring without leukocytosis.  However we did note hypokalemia, elevated lipase and transaminitis. on review of records, CT scan from 2011 did not reveal evidence of pancreatic cancer.  It did note, cholelithiasis.  Given the fact the patient is asymptomatic at this time, I do not feel that emergent advanced imaging is warranted at this time however I did stress the importance of close follow-up with primary care provider or gastroenterologist for reevaluation of these elevated enzymes.  Patient given oral potassium for repletion.  She is able to tolerate oral intake.  The patient is safe for discharge with strict return precautions.   Final Clinical Impression(s) / ED Diagnoses Final diagnoses:  None   Disposition: Discharge  Condition: Good  I have discussed the results, Dx and Tx plan with the patient and husband who expressed understanding and agree(s) with the plan. Discharge instructions discussed at great length. The patient and husband were given strict return precautions who verbalized understanding of the instructions. No further questions at time of discharge.    New Prescriptions   MAGNESIUM 30 MG TABLET    Take 1 tablet (30 mg total) by mouth daily.   POTASSIUM CHLORIDE SA (K-DUR,KLOR-CON) 20 MEQ TABLET    Take 1 tablet (20 mEq total) by mouth 2 (two) times daily.    Follow Up: Gaspar Garbeisovec, Richard W, MD [address] [place] [id number] [phone]  Schedule an appointment as soon as possible for a visit  in 3-5 days for close follow up to assess for elevated pancreatic and liver enzymes in setting of persistent diarrhea. Also to follow up for low  potassium.      This chart was dictated using voice recognition software.  Despite best efforts to proofread,  errors can occur which can change the documentation meaning.   Nira Connardama, Pedro Eduardo, MD 06/11/17 [phone]

## 2017-06-11 NOTE — ED Triage Notes (Signed)
Pt husband reports pt with diarrhea x 1 week, he has been giving immodium and pedialyte, pt cont with diarrhea. Pt is pleasantly confused, smiling and denies any c/o, "I feel fine." pt husband states pt had same diarrhea in April, saw a gi specialist who recommended colonoscopy and biopsy. Husband states pt just recently agreed to have colonoscopy and biopsy, scheduled for a few weeks from now.

## 2017-06-17 ENCOUNTER — Encounter: Payer: Self-pay | Admitting: Gastroenterology

## 2017-06-17 DIAGNOSIS — R82998 Other abnormal findings in urine: Secondary | ICD-10-CM | POA: Diagnosis not present

## 2017-06-17 DIAGNOSIS — F0390 Unspecified dementia without behavioral disturbance: Secondary | ICD-10-CM | POA: Diagnosis not present

## 2017-06-17 DIAGNOSIS — N390 Urinary tract infection, site not specified: Secondary | ICD-10-CM | POA: Diagnosis not present

## 2017-06-17 DIAGNOSIS — R197 Diarrhea, unspecified: Secondary | ICD-10-CM | POA: Diagnosis not present

## 2017-06-17 DIAGNOSIS — E538 Deficiency of other specified B group vitamins: Secondary | ICD-10-CM | POA: Diagnosis not present

## 2017-06-17 DIAGNOSIS — R300 Dysuria: Secondary | ICD-10-CM | POA: Diagnosis not present

## 2017-06-19 ENCOUNTER — Emergency Department (HOSPITAL_BASED_OUTPATIENT_CLINIC_OR_DEPARTMENT_OTHER)
Admission: EM | Admit: 2017-06-19 | Discharge: 2017-06-19 | Disposition: A | Discharge status: Home / Self Care | Payer: Medicare Other | Attending: Emergency Medicine | Admitting: Emergency Medicine

## 2017-06-19 ENCOUNTER — Encounter (HOSPITAL_BASED_OUTPATIENT_CLINIC_OR_DEPARTMENT_OTHER): Payer: Self-pay | Admitting: *Deleted

## 2017-06-19 DIAGNOSIS — M6281 Muscle weakness (generalized): Secondary | ICD-10-CM | POA: Insufficient documentation

## 2017-06-19 DIAGNOSIS — R197 Diarrhea, unspecified: Secondary | ICD-10-CM | POA: Insufficient documentation

## 2017-06-19 DIAGNOSIS — E860 Dehydration: Secondary | ICD-10-CM | POA: Insufficient documentation

## 2017-06-19 DIAGNOSIS — R531 Weakness: Secondary | ICD-10-CM

## 2017-06-19 DIAGNOSIS — R5383 Other fatigue: Secondary | ICD-10-CM | POA: Diagnosis present

## 2017-06-19 DIAGNOSIS — G309 Alzheimer's disease, unspecified: Secondary | ICD-10-CM | POA: Insufficient documentation

## 2017-06-19 DIAGNOSIS — R112 Nausea with vomiting, unspecified: Secondary | ICD-10-CM | POA: Diagnosis not present

## 2017-06-19 LAB — COMPREHENSIVE METABOLIC PANEL
ALBUMIN: 3.7 g/dL (ref 3.5–5.0)
ALK PHOS: 132 U/L — AB (ref 38–126)
ALT: 51 U/L (ref 14–54)
ANION GAP: 7 (ref 5–15)
AST: 38 U/L (ref 15–41)
BUN: 7 mg/dL (ref 6–20)
CALCIUM: 8.8 mg/dL — AB (ref 8.9–10.3)
CO2: 29 mmol/L (ref 22–32)
CREATININE: 0.61 mg/dL (ref 0.44–1.00)
Chloride: 99 mmol/L — ABNORMAL LOW (ref 101–111)
GFR calc Af Amer: >60 mL/min (ref >60)
GFR calc non Af Amer: >60 mL/min (ref >60)
GLUCOSE: 101 mg/dL — AB (ref 65–99)
Potassium: 3.6 mmol/L (ref 3.5–5.1)
SODIUM: 135 mmol/L (ref 135–145)
Total Bilirubin: 0.5 mg/dL (ref 0.3–1.2)
Total Protein: 7.4 g/dL (ref 6.5–8.1)

## 2017-06-19 LAB — CBC WITH DIFFERENTIAL/PLATELET
BASOS PCT: 0 %
Basophils Absolute: 0 10*3/uL (ref 0.0–0.1)
Eosinophils Absolute: 0 10*3/uL (ref 0.0–0.7)
Eosinophils Relative: 0 %
HEMATOCRIT: 39.7 % (ref 36.0–46.0)
Hemoglobin: 12.9 g/dL (ref 12.0–15.0)
LYMPHS ABS: 1 10*3/uL (ref 0.7–4.0)
Lymphocytes Relative: 9 %
MCH: 31.6 pg (ref 26.0–34.0)
MCHC: 32.5 g/dL (ref 30.0–36.0)
MCV: 97.3 fL (ref 78.0–100.0)
MONO ABS: 0.7 10*3/uL (ref 0.1–1.0)
MONOS PCT: 7 %
NEUTROS ABS: 8.7 10*3/uL — AB (ref 1.7–7.7)
Neutrophils Relative %: 84 %
Platelets: 275 10*3/uL (ref 150–400)
RBC: 4.08 MIL/uL (ref 3.87–5.11)
RDW: 12 % (ref 11.5–15.5)
WBC: 10.4 10*3/uL (ref 4.0–10.5)

## 2017-06-19 MED ORDER — ONDANSETRON 4 MG PO TBDP: 4.0000 mg | ORAL_TABLET | Freq: Three times a day (TID) | ORAL | 0 refills | Status: DC | PRN

## 2017-06-19 MED ORDER — SODIUM CHLORIDE 0.9 % IV BOLUS (SEPSIS)
1000.0000 mL | Freq: Once | INTRAVENOUS | Status: AC
Start: 2017-06-19 — End: 2017-06-19
  Administered 2017-06-19: 1000 mL via INTRAVENOUS

## 2017-06-19 MED ORDER — ONDANSETRON HCL 4 MG/2ML IJ SOLN
4.0000 mg | Freq: Once | INTRAMUSCULAR | Status: AC
  Administered 2017-06-19: 4 mg via INTRAVENOUS
  Filled 2017-06-19: qty 2

## 2017-06-19 MED FILL — ONDANSETRON ODT 4 MG TABLET: 4 | 7 days supply | Qty: 20 | Fill #0

## 2017-06-19 NOTE — ED Triage Notes (Signed)
Pt's husband reports he feels pt is dehydrated. States she has been weak and dizzy. States he has been giving her oral rehydration fluids. Pt vomited x 3 pta this am. Pt alert, denies pain

## 2017-06-19 NOTE — ED Provider Notes (Signed)
Emergency Department Provider Note   I have reviewed the triage vital signs and the nursing notes.   HISTORY  Chief Complaint Fatigue   HPI Kim Hernandez is a 76 y.o. female with PMH of Alzheimer's dementia and chronic diarrhea evaluation generalized fatigue in the setting of continued diarrhea.  The patient's husband states he has been trying to rehydrate her with oral fluids but this morning she had several episodes of vomiting when he was attempting this.  The emesis was nonbloody.  She has not had fevers or chills.  Patient is not complaining of chest pain, abdominal pain, burning with urination but she has Alzheimer's dementia which complicates this picture.  The patient is followed by gastroenterology and had has a colonoscopy scheduled for later this month.  No recent antibiotics.  No modifying factors.  Level 5 caveat: Dementia.    Past Medical History:  Diagnosis Date  . Alcohol abuse   . Alzheimer's disease 05/23/2010   ?  . ANXIETY DEPRESSION 05/23/2010  . Arthritis   . DEMENTIA 07/20/2008  . Depression    monopolar  . GERD 05/11/2007   ?  Marland Kitchen Hyperlipidemia   . Kidney stone   . Memory loss   . Migraine    pt denies  . OSTEOARTHRITIS, KNEE 05/23/2010  . Osteopenia   . OSTEOPOROSIS 12/17/2007  . Wrist pain, right 04/17/2011    Patient Active Problem List   Diagnosis Date Noted  . History of dehydration 03/26/2017  . Arterial hypotension 03/26/2017  . Syncope and collapse 03/26/2017  . Gait instability 11/13/2016  . Hyperlipidemia 04/17/2011  . Major depression in remission (HCC) 05/23/2010  . Alzheimer's disease 05/23/2010    Past Surgical History:  Procedure Laterality Date  . DILATION AND CURETTAGE OF UTERUS     forty years ago  . TONSILLECTOMY    . TOTAL KNEE ARTHROPLASTY Right     Current Outpatient Rx  . Order #: [id number] Class: Normal  . Order #: [id number] Class: Historical Med  . Order #: [id number] Class: Normal  . Order #: [id number] Class:  Normal  . Order #: [id number] Class: Historical Med  . Order #: [id number] Class: Historical Med  . Order #: [id number] Class: Normal  . Order #: [id number] Class: Print  . Order #: [id number] Class: Historical Med  . Order #: [id number] Class: Print    Allergies Poison ivy extract  Family History  Problem Relation Age of Onset  . Anemia Mother   . Alzheimer's disease Mother   . Emphysema Mother        smoker  . Obesity Sister   . Emphysema Sister        smoker  . Coronary artery disease Brother        smoker  . Heart attack Brother   . Obesity Daughter   . Depression Daughter   . Alcohol abuse Son   . Depression Son   . Suicidality Son        10/12-commited suicide in a manic depressive episode    Social History Social History  Substance Use Topics  . Smoking status: Never Smoker  . Smokeless tobacco: Never Used  . Alcohol use Yes     Comment: quit- had a problem w/ alcohol and has abstained for years    Review of Systems  Level 5 caveat: Dementia.   ____________________________________________   PHYSICAL EXAM:  VITAL SIGNS: ED Triage Vitals  Enc Vitals Group     BP 06/19/17 1134 (!) 147/83     Pulse Rate 06/19/17 1134 68  Resp 06/19/17 1134 18     Temp 06/19/17 1134 98.2 F (36.8 C)     Temp Source 06/19/17 1134 Oral     SpO2 06/19/17 1134 96 %     Pain Score 06/19/17 1143 0   Constitutional: Alert but confused.  Well appearing and in no acute distress. Eyes: Conjunctivae are normal.  Head: Atraumatic. Nose: No congestion/rhinnorhea. Mouth/Throat: Mucous membranes are slightly dry.  Neck: No stridor.  Cardiovascular: Normal rate, regular rhythm. Good peripheral circulation. Grossly normal heart sounds.   Respiratory: Normal respiratory effort.  No retractions. Lungs CTAB. Gastrointestinal: Soft and nontender. No distention.  Musculoskeletal: No lower extremity tenderness nor edema. No gross deformities of extremities. Neurologic:  Normal speech and  language. No gross focal neurologic deficits are appreciated.  Skin:  Skin is warm, dry and intact. No rash noted.  ____________________________________________   LABS (all labs ordered are listed, but only abnormal results are displayed)  Labs Reviewed  COMPREHENSIVE METABOLIC PANEL - Abnormal; Notable for the following:       Result Value   Chloride 99 (*)    Glucose, Bld 101 (*)    Calcium 8.8 (*)    Alkaline Phosphatase 132 (*)    All other components within normal limits  CBC WITH DIFFERENTIAL/PLATELET - Abnormal; Notable for the following:    Neutro Abs 8.7 (*)    All other components within normal limits   ____________________________________________  EKG   EKG Interpretation  Date/Time:  Thursday June 19 2017 11:29:37 EDT Ventricular Rate:  62 PR Interval:    QRS Duration: 104 QT Interval:  454 QTC Calculation: 462 R Axis:   -7 Text Interpretation:  Sinus rhythm RSR' in V1 or V2, right VCD or RVH No STEMI.  Confirmed by Alona BeneLong, Mihira Tozzi [phone]([id number]) on 06/19/2017 11:34:24 AM       ____________________________________________  RADIOLOGY  None ____________________________________________   PROCEDURES  Procedure(s) performed:   Procedures  None ____________________________________________   INITIAL IMPRESSION / ASSESSMENT AND PLAN / ED COURSE  Pertinent labs & imaging results that were available during my care of the patient were reviewed by me and considered in my medical decision making (see chart for details).  Patient with chronic diarrhea presents with dehydration and difficulty with PO hydration this AM due to vomiting. No vomiting in the ED. No CP or SOB. Abdomen is completely soft and non-distended. Diarrhea is chronic in nature and actively followed by GI. Plan for IVF hydration and screening labs to assess for dehydration.   01:39 PM Patient is tolerating PO and in no acute distress. Plan for discharge with Zofran. She will continue taking  Immodium and f/u with GI.   At this time, I do not feel there is any life-threatening condition present. I have reviewed and discussed all results (EKG, imaging, lab, urine as appropriate), exam findings with patient. I have reviewed nursing notes and appropriate previous records.  I feel the patient is safe to be discharged home without further emergent workup. Discussed usual and customary return precautions. Patient and family (if present) verbalize understanding and are comfortable with this plan.  Patient will follow-up with their primary care provider. If they do not have a primary care provider, information for follow-up has been provided to them. All questions have been answered.   ____________________________________________  FINAL CLINICAL IMPRESSION(S) / ED DIAGNOSES  Final diagnoses:  Dehydration  Generalized weakness  Diarrhea, unspecified type  Non-intractable vomiting with nausea, unspecified vomiting type     MEDICATIONS GIVEN DURING  THIS VISIT:  Medications  sodium chloride 0.9 % bolus 1,000 mL (0 mLs Intravenous Stopped 06/19/17 1405)  ondansetron (ZOFRAN) injection 4 mg (4 mg Intravenous Given 06/19/17 1236)     NEW OUTPATIENT MEDICATIONS STARTED DURING THIS VISIT:  Discharge Medication List as of 06/19/2017  1:40 PM    START taking these medications   Details  ondansetron (ZOFRAN ODT) 4 MG disintegrating tablet Take 1 tablet (4 mg total) by mouth every 8 (eight) hours as needed for nausea or vomiting., Starting Thu 06/19/2017, Print        Note:  This document was prepared using Dragon voice recognition software and may include unintentional dictation errors.  Alona Bene, MD Emergency Medicine    Eleazar Kimmey, Arlyss Repress, MD 06/19/17 [phone]

## 2017-06-19 NOTE — Discharge Instructions (Signed)

## 2017-06-19 NOTE — ED Notes (Signed)
Pt tolerating POs

## 2017-06-25 ENCOUNTER — Telehealth: Payer: Self-pay | Admitting: Gastroenterology

## 2017-06-25 NOTE — Telephone Encounter (Signed)
I would hold immodium 3-4 days in advance. Otherwise, yes, biopsies will be taken of the colon even if normal to rule out microscopic inflammation. Thanks

## 2017-06-25 NOTE — Telephone Encounter (Signed)
Spoke to patient's husband and he is aware of recommendations.

## 2017-06-25 NOTE — Telephone Encounter (Signed)
Patient has colonoscopy scheduled for 11/13, please advise.

## 2017-06-26 ENCOUNTER — Encounter: Payer: Self-pay | Admitting: Neurology

## 2017-06-26 ENCOUNTER — Ambulatory Visit: Payer: Medicare Other | Admitting: Neurology

## 2017-06-26 VITALS — BP 109/69 | HR 69 | Ht 61.0 in | Wt 134.0 lb

## 2017-06-26 DIAGNOSIS — R55 Syncope and collapse: Secondary | ICD-10-CM | POA: Insufficient documentation

## 2017-06-26 DIAGNOSIS — E860 Dehydration: Secondary | ICD-10-CM | POA: Insufficient documentation

## 2017-06-26 DIAGNOSIS — F0281 Dementia in other diseases classified elsewhere with behavioral disturbance: Secondary | ICD-10-CM

## 2017-06-26 DIAGNOSIS — R3981 Functional urinary incontinence: Secondary | ICD-10-CM | POA: Diagnosis not present

## 2017-06-26 DIAGNOSIS — G301 Alzheimer's disease with late onset: Secondary | ICD-10-CM

## 2017-06-26 HISTORY — DX: Syncope and collapse: R55

## 2017-06-26 NOTE — Patient Instructions (Signed)
Alzheimer Disease Caregiver Guide A person who has Alzheimer disease may not be able to take care of himself or herself. He or she may need help with simple tasks. The tips below can help you care for the person. Memory loss and confusion If the person is confused or cannot remember things:  Stay calm.  Respond with a short answer.  Avoid correcting him or her in a way that sounds like scolding.  Try not to take it personally, even if he or she forgets your name.  Behavior changes The person may go through behavior changes. This can include depression, anxiety, anger, or seeing things that are not there. When behavior changes:  Try not to take behavior changes personally.  Stay calm and patient.  Do not argue or try to convince the person about a specific point.  Know that these changes are part of the disease process. Try to work through it.  Tips to lessen frustration  Make appointments and do daily tasks when the person is at his or her best.  Take your time. Simple tasks may take longer. Allow plenty of time to complete tasks.  Limit choices for the person.  Involve the person in what you are doing.  Stick to a routine.  Avoid new or crowded places, if possible.  Use simple words, short sentences, and a calm voice. Only give 1 direction at a time.  Buy clothes and shoes that are easy to put on and take off.  Let people help if they offer. Home safety  Keep floors clear. Remove rugs, magazine racks, and floor lamps.  Keep hallways well lit.  Put a handrail and nonslip mat in the bathtub or shower.  Put childproof locks on cabinets that have dangerous items in them. These items include medicine, alcohol, guns, toxic cleaning items, sharp tools, matches, or lighters.  Place locks on doors where the person cannot see or reach them. This helps the person to not wander out of the house and get lost.  Be prepared for emergencies. Keep a list of emergency phone  numbers and addresses in a handy area. Plans for the future  Talk about finances. ? Talk about money management. People with Alzheimer disease have trouble managing their money as the disease gets worse. ? Get help from professional advisors about financial and legal matters.  Talk about future care. ? Choose a power of attorney. This is someone who can make decisions for the person with Alzheimer disease when he or she can no longer do so. ? Talk about driving and when it is the right time to stop. The person's doctor can help with this. ? If the person lives alone, make sure he or she is safe. Some people need extra help at home. Other people need more care at a nursing home or care center. Support groups Some benefits of joining a support group include:  Learning ways to manage stress.  Sharing experiences with others.  Getting emotional comfort and support.  Learning new caregiving skills as the disease progresses.  Knowing what community resources are available and taking advantage of them.  Get help if:  The person has a fever.  The person has a sudden behavior change that does not get better with calming strategies.  The person is unable to manage his or her living situation.  The person threatens you or anyone else, including himself or herself.  You are no longer able to care for the person. This information is not   intended to replace advice given to you by your health care provider. Make sure you discuss any questions you have with your health care provider. Document Released: 10/28/2011 Document Revised: 01/11/2016 Document Reviewed: 09/25/2011 Elsevier Interactive Patient Education  2017 Elsevier Inc.  
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
## 2017-06-30 ENCOUNTER — Telehealth: Payer: Self-pay | Admitting: Gastroenterology

## 2017-06-30 NOTE — Telephone Encounter (Signed)
Returned patients call. Loistine Chancehilip was concerned because [name] has alzheimer's and can not answer questions. Loistine Chancehilip states that if [name] gets confused or scared she can become very agitated. Loistine Chancehilip wanted to know if it was possible for [facility] to [facility]. I reassured him that he could be with her anytime he was needed. He was also told that he would not be needed during the actual procedure because she will be sedated. Patient was relieved to hear that he could be with his wife.   Janalee DaneNancy Makisha Marrin, LPN

## 2017-06-30 NOTE — Telephone Encounter (Signed)
Patient husband states pt has alzheimer's and wants to know if he can come back with her if needed.

## 2017-07-01 ENCOUNTER — Ambulatory Visit (AMBULATORY_SURGERY_CENTER): Payer: Medicare Other | Admitting: Gastroenterology

## 2017-07-01 ENCOUNTER — Encounter: Payer: Self-pay | Admitting: Gastroenterology

## 2017-07-01 VITALS — BP 105/60 | HR 54 | Temp 97.8°F | Resp 13 | Ht 61.0 in | Wt 134.0 lb

## 2017-07-01 DIAGNOSIS — R197 Diarrhea, unspecified: Secondary | ICD-10-CM | POA: Diagnosis not present

## 2017-07-01 DIAGNOSIS — K52832 Lymphocytic colitis: Secondary | ICD-10-CM | POA: Diagnosis not present

## 2017-07-01 DIAGNOSIS — F341 Dysthymic disorder: Secondary | ICD-10-CM | POA: Diagnosis not present

## 2017-07-01 DIAGNOSIS — G309 Alzheimer's disease, unspecified: Secondary | ICD-10-CM | POA: Diagnosis not present

## 2017-07-01 HISTORY — PX: COLONOSCOPY: SHX174

## 2017-07-01 MED ORDER — SODIUM CHLORIDE 0.9 % IV SOLN: 500.0000 mL | INTRAVENOUS | Status: DC

## 2017-07-01 NOTE — Op Note (Signed)
[facility] Patient Name: Kim Hernandez Procedure Date: 07/01/2017 1:43 PM MRN: [id number] Endoscopist: Viviann Spare P. Armbruster MD, MD Age: 76 Referring MD:  Date of Birth: 08-Dec-1940 Gender: Female Account #: [id number] Procedure:                Colonoscopy Indications:              Chronic diarrhea of unclear etiology Medicines:                Monitored Anesthesia Care Procedure:                Pre-Anesthesia Assessment:                           - Prior to the procedure, a History and Physical                            was performed, and patient medications and                            allergies were reviewed. The patient's tolerance of                            previous anesthesia was also reviewed. The risks                            and benefits of the procedure and the sedation                            options and risks were discussed with the patient.                            All questions were answered, and informed consent                            was obtained. Prior Anticoagulants: The patient has                            taken no previous anticoagulant or antiplatelet                            agents. ASA Grade Assessment: II - A patient with                            mild systemic disease. After reviewing the risks                            and benefits, the patient was deemed in                            satisfactory condition to undergo the procedure.                           After obtaining informed consent, the colonoscope  was passed under direct vision. Throughout the                            procedure, the patient's blood pressure, pulse, and                            oxygen saturations were monitored continuously. The                            Colonoscope was introduced through the anus and                            advanced to the the terminal ileum, with                            identification of the  appendiceal orifice and IC                            valve. The colonoscopy was performed without                            difficulty. The patient tolerated the procedure                            well. The quality of the bowel preparation was                            excellent. The terminal ileum, ileocecal valve,                            appendiceal orifice, and rectum were photographed. Scope In: 1:53:07 PM Scope Out: 2:06:17 PM Scope Withdrawal Time: 0 hours 9 minutes 47 seconds  Total Procedure Duration: 0 hours 13 minutes 10 seconds  Findings:                 The perianal exam findings include a skin tag.                           The terminal ileum appeared normal.                           The exam was otherwise without abnormality. No                            inflammatory changes. No polyps. Retroflexion not                            performed given small rectal vault.                           Biopsies for histology were taken with a cold                            forceps from the right colon, left colon and  transverse colon for evaluation of microscopic                            colitis. Complications:            No immediate complications. Estimated blood loss:                            Minimal. Estimated Blood Loss:     Estimated blood loss was minimal. Impression:               - Perianal skin tag found on perianal exam.                           - The examined portion of the ileum was normal.                           - The examination was otherwise normal. No                            inflammatory changes or polyps.                           - Biopsies were taken with a cold forceps from the                            right colon, left colon and transverse colon for                            evaluation of microscopic colitis.                           - Retroflexed views not performed due to small                             rectal vault. Recommendation:           - Patient has a contact number available for                            emergencies. The signs and symptoms of potential                            delayed complications were discussed with the                            patient. Return to normal activities tomorrow.                            Written discharge instructions were provided to the                            patient.                           - Resume previous diet.                           -  Continue present medications.                           - Await pathology results.                           - No further colonoscopy is needed for screening                            purposes. Viviann Spare P. Armbruster MD, MD 07/01/2017 2:10:44 PM This report has been signed electronically.

## 2017-07-01 NOTE — Progress Notes (Signed)
TO PACU  Awake and alert report to RN

## 2017-07-01 NOTE — Progress Notes (Signed)
Called to room to assist during endoscopic procedure.  Patient ID and intended procedure confirmed with present staff. Received instructions for my participation in the procedure from the performing physician.  

## 2017-07-01 NOTE — Patient Instructions (Addendum)
YOU HAD AN ENDOSCOPIC PROCEDURE TODAY AT THE [facility]:   Refer to the procedure report that was given to you for any specific questions about what was found during the examination.  If the procedure report does not answer your questions, please call your gastroenterologist to clarify.  If you requested that your care partner not be given the details of your procedure findings, then the procedure report has been included in a sealed envelope for you to review at your convenience later.  YOU SHOULD EXPECT: Some feelings of bloating in the abdomen. Passage of more gas than usual.  Walking can help get rid of the air that was put into your GI tract during the procedure and reduce the bloating. If you had a lower endoscopy (such as a colonoscopy or flexible sigmoidoscopy) you may notice spotting of blood in your stool or on the toilet paper. If you underwent a bowel prep for your procedure, you may not have a normal bowel movement for a few days.  Please Note:  You might notice some irritation and congestion in your nose or some drainage.  This is from the oxygen used during your procedure.  There is no need for concern and it should clear up in a day or so.  SYMPTOMS TO REPORT IMMEDIATELY:   Following lower endoscopy (colonoscopy or flexible sigmoidoscopy):  Excessive amounts of blood in the stool  Significant tenderness or worsening of abdominal pains  Swelling of the abdomen that is new, acute  Fever of 100F or higher   For urgent or emergent issues, a gastroenterologist can be reached at any hour by calling [phone].   DIET:  We do recommend a small meal at first, but then you may proceed to your regular diet.  Drink plenty of fluids but you should avoid alcoholic beverages for 24 hours.  ACTIVITY:  You should plan to take it easy for the rest of today and you should NOT DRIVE or use heavy machinery until tomorrow (because of the sedation medicines used during the test).     FOLLOW UP: Our staff will call the number listed on your records the next business day following your procedure to check on you and address any questions or concerns that you may have regarding the information given to you following your procedure. If we do not reach you, we will leave a message.  However, if you are feeling well and you are not experiencing any problems, there is no need to return our call.  We will assume that you have returned to your regular daily activities without incident.  If any biopsies were taken you will be contacted by phone or by letter within the next 1-3 weeks.  Please call us at [phone] if you have not heard about the biopsies in 3 weeks.    SIGNATURES/CONFIDENTIALITY: You and/or your care partner have signed paperwork which will be entered into your electronic medical record.  These signatures attest to the fact that that the information above on your After Visit Summary has been reviewed and is understood.  Full responsibility of the confidentiality of this discharge information lies with you and/or your care-partner.  Thank you for letting us take care of your healthcare needs today. 

## 2017-07-02 ENCOUNTER — Telehealth: Payer: Self-pay

## 2017-07-02 NOTE — Telephone Encounter (Signed)
  Follow up Call-  Call back number 07/01/2017  Post procedure Call Back phone  # [phone]  Permission to leave phone message Yes  Some recent data might be hidden     Patient questions:  Do you have a fever, pain , or abdominal swelling? No. Pain Score  0 *  Have you tolerated food without any problems? Yes.    Have you been able to return to your normal activities? Yes.    Do you have any questions about your discharge instructions: Diet   No. Medications  No. Follow up visit  No.  Do you have questions or concerns about your Care? No.  Actions: * If pain score is 4 or above: No action needed, pain <4.

## 2017-07-02 NOTE — Telephone Encounter (Signed)
  Follow up Call-  Call back number 07/01/2017  Post procedure Call Back phone  # [phone]  Permission to leave phone message Yes  Some recent data might be hidden     Tried to leave a message but was unable to.

## 2017-07-07 ENCOUNTER — Telehealth: Payer: Self-pay | Admitting: Gastroenterology

## 2017-07-07 NOTE — Telephone Encounter (Signed)
Spoke to patient's husband, let him know Dr. Lanetta InchArmbruster's recommendations. Husband states that her pain is now gone. Will call back if they have questions or concerns.

## 2017-07-07 NOTE — Telephone Encounter (Signed)
Patient husband calling back in regarding this.

## 2017-07-07 NOTE — Telephone Encounter (Signed)
No if the pain just started today I don't think it would be related to her procedure, which was done 6 days ago, no polyps removed only random biopsies, uncomplicated colonoscopy. Okay to eat if she is not nauseated. If pain persist she can call us back or seek evaluation.

## 2017-07-07 NOTE — Telephone Encounter (Signed)
Husband called this morning states patient woke up this a.m. with LLQ abdominal pain, rated 5/10. Pain has eased up a little, 4/10. She has had a bm since procedure on 11/13, denies any blood in stool. Husband did not want to give her anything to eat this morning, even though she is hungry, okayed to let her eat something as she is not nauseated.

## 2017-07-08 ENCOUNTER — Emergency Department (HOSPITAL_COMMUNITY)
Admission: EM | Admit: 2017-07-08 | Discharge: 2017-07-09 | Disposition: A | Discharge status: Home / Self Care | Payer: Medicare Other | Attending: Emergency Medicine | Admitting: Emergency Medicine

## 2017-07-08 ENCOUNTER — Encounter (HOSPITAL_COMMUNITY): Payer: Self-pay | Admitting: Nurse Practitioner

## 2017-07-08 ENCOUNTER — Other Ambulatory Visit: Payer: Self-pay

## 2017-07-08 ENCOUNTER — Emergency Department (HOSPITAL_COMMUNITY): Payer: Medicare Other

## 2017-07-08 ENCOUNTER — Telehealth: Payer: Self-pay | Admitting: Neurology

## 2017-07-08 ENCOUNTER — Encounter: Payer: Self-pay | Admitting: Neurology

## 2017-07-08 DIAGNOSIS — R319 Hematuria, unspecified: Secondary | ICD-10-CM | POA: Diagnosis not present

## 2017-07-08 DIAGNOSIS — N202 Calculus of kidney with calculus of ureter: Secondary | ICD-10-CM | POA: Diagnosis not present

## 2017-07-08 DIAGNOSIS — R1032 Left lower quadrant pain: Secondary | ICD-10-CM | POA: Insufficient documentation

## 2017-07-08 DIAGNOSIS — G309 Alzheimer's disease, unspecified: Secondary | ICD-10-CM | POA: Insufficient documentation

## 2017-07-08 DIAGNOSIS — Z79899 Other long term (current) drug therapy: Secondary | ICD-10-CM | POA: Insufficient documentation

## 2017-07-08 DIAGNOSIS — R10814 Left lower quadrant abdominal tenderness: Secondary | ICD-10-CM | POA: Insufficient documentation

## 2017-07-08 DIAGNOSIS — K8020 Calculus of gallbladder without cholecystitis without obstruction: Secondary | ICD-10-CM | POA: Diagnosis not present

## 2017-07-08 DIAGNOSIS — N201 Calculus of ureter: Secondary | ICD-10-CM | POA: Diagnosis not present

## 2017-07-08 DIAGNOSIS — R350 Frequency of micturition: Secondary | ICD-10-CM | POA: Diagnosis present

## 2017-07-08 DIAGNOSIS — R030 Elevated blood-pressure reading, without diagnosis of hypertension: Secondary | ICD-10-CM | POA: Diagnosis not present

## 2017-07-08 LAB — URINALYSIS, ROUTINE W REFLEX MICROSCOPIC
BACTERIA UA: NONE SEEN
BILIRUBIN URINE: NEGATIVE
Glucose, UA: NEGATIVE mg/dL
Ketones, ur: 5 mg/dL — AB
Leukocytes, UA: NEGATIVE
NITRITE: NEGATIVE
PROTEIN: 30 mg/dL — AB
SPECIFIC GRAVITY, URINE: 1.02 (ref 1.005–1.030)
SQUAMOUS EPITHELIAL / LPF: NONE SEEN
pH: 6 (ref 5.0–8.0)

## 2017-07-08 LAB — CBC
HEMATOCRIT: 40.7 % (ref 36.0–46.0)
Hemoglobin: 13.7 g/dL (ref 12.0–15.0)
MCH: 32.5 pg (ref 26.0–34.0)
MCHC: 33.7 g/dL (ref 30.0–36.0)
MCV: 96.4 fL (ref 78.0–100.0)
Platelets: 206 10*3/uL (ref 150–400)
RBC: 4.22 MIL/uL (ref 3.87–5.11)
RDW: 12.5 % (ref 11.5–15.5)
WBC: 13.4 10*3/uL — ABNORMAL HIGH (ref 4.0–10.5)

## 2017-07-08 LAB — BASIC METABOLIC PANEL
Anion gap: 10 (ref 5–15)
BUN: 21 mg/dL — AB (ref 6–20)
CHLORIDE: 98 mmol/L — AB (ref 101–111)
CO2: 26 mmol/L (ref 22–32)
CREATININE: 1.11 mg/dL — AB (ref 0.44–1.00)
Calcium: 9 mg/dL (ref 8.9–10.3)
GFR calc Af Amer: 54 mL/min — ABNORMAL LOW (ref >60)
GFR calc non Af Amer: 47 mL/min — ABNORMAL LOW (ref >60)
GLUCOSE: 113 mg/dL — AB (ref 65–99)
POTASSIUM: 3.4 mmol/L — AB (ref 3.5–5.1)
SODIUM: 134 mmol/L — AB (ref 135–145)

## 2017-07-08 LAB — I-STAT CG4 LACTIC ACID, ED: Lactic Acid, Venous: 0.82 mmol/L (ref 0.5–1.9)

## 2017-07-08 MED ORDER — BUDESONIDE 3 MG PO CPEP: ORAL_CAPSULE | ORAL | 0 refills | Status: DC

## 2017-07-08 MED ORDER — SODIUM CHLORIDE 0.9 % IV SOLN: INTRAVENOUS | Status: DC

## 2017-07-08 MED ORDER — TAMSULOSIN HCL 0.4 MG PO CAPS: ORAL_CAPSULE | ORAL | 0 refills | Status: DC

## 2017-07-08 MED ORDER — TAMSULOSIN HCL 0.4 MG PO CAPS
0.4000 mg | ORAL_CAPSULE | Freq: Once | ORAL | Status: AC
  Administered 2017-07-09: 0.4 mg via ORAL
  Filled 2017-07-08: qty 1

## 2017-07-08 MED ORDER — IOPAMIDOL (ISOVUE-300) INJECTION 61%
INTRAVENOUS | Status: AC
  Administered 2017-07-08: 100 mL via INTRAVENOUS
  Filled 2017-07-08: qty 100

## 2017-07-08 MED ORDER — SODIUM CHLORIDE 0.9 % IV BOLUS (SEPSIS)
500.0000 mL | Freq: Once | INTRAVENOUS | Status: AC
  Administered 2017-07-08: 500 mL via INTRAVENOUS

## 2017-07-08 NOTE — ED Notes (Signed)
Pt seems to becoming increasingly confused, having to repeat same instructions over and over, and Pt seems surprised each time.

## 2017-07-08 NOTE — ED Provider Notes (Signed)
[facility]-EMERGENCY DEPT Provider Note   CSN: [id number] Arrival date & time: 07/08/17  1830     History   Chief Complaint No chief complaint on file.   HPI   Kim Hernandez is a 76 y.o. female.  Patient is here with family members to be evaluated for possible urinary tract infection.  Patient has apparently been urinating frequently and has an odor, in her urine.  Last evening she had some discomfort in her left lower abdomen.  It resolved spontaneously.  Her husband was concerned that she was dehydrated so has been giving her lots of fluids, orally but he feels like she just urinates it out.  She has not been vomiting.  She had a colonoscopy about 2 weeks ago and was diagnosed with lymphocytic colitis, as a result of biopsies which were taken.  It is felt that this was a source for her ongoing diarrhea.  She has not been complaining of cough or chest pain.  She is unable to ask questions about her history.  Level 5 caveat-dementia    HPI  Past Medical History:  Diagnosis Date  . Alcohol abuse   . Alzheimer's disease 05/23/2010   ?  . ANXIETY DEPRESSION 05/23/2010  . Arthritis   . DEMENTIA 07/20/2008  . Depression    monopolar  . GERD 05/11/2007   ?  Marland Kitchen Hyperlipidemia   . Kidney stone   . Memory loss   . Migraine    pt denies  . OSTEOARTHRITIS, KNEE 05/23/2010  . Osteopenia   . OSTEOPOROSIS 12/17/2007  . Wrist pain, right 04/17/2011    Patient Active Problem List   Diagnosis Date Noted  . Vasovagal syncope 06/26/2017  . Dehydration, moderate 06/26/2017  . Urinary incontinence due to cognitive impairment 06/26/2017  . History of dehydration 03/26/2017  . Arterial hypotension 03/26/2017  . Syncope and collapse 03/26/2017  . Gait instability 11/13/2016  . Hyperlipidemia 04/17/2011  . Major depression in remission (HCC) 05/23/2010  . Alzheimer's disease 05/23/2010    Past Surgical History:  Procedure Laterality Date  . COLONOSCOPY    .  DILATION AND CURETTAGE OF UTERUS     forty years ago  . TONSILLECTOMY    . TOTAL KNEE ARTHROPLASTY Right     OB History    No data available       Home Medications    Prior to Admission medications   Medication Sig Start Date End Date Taking? Authorizing Provider  atorvastatin (LIPITOR) 40 MG tablet TAKE 1 TABLET(40 MG) BY MOUTH DAILY 10/28/16  Yes Myrlene Broker, MD  Cholecalciferol (VITAMIN D3) 1000 UNITS CAPS Take 1 capsule by mouth daily.     Yes [provider]  citalopram (CELEXA) 20 MG tablet Take 1 tablet (20 mg total) by mouth daily. 10/30/15  Yes Myrlene Broker, MD  donepezil (ARICEPT) 10 MG tablet Take 1 tablet (10 mg total) by mouth at bedtime. 01/06/17  Yes Dohmeier, Porfirio Mylar, MD  fludrocortisone (FLORINEF) 0.1 MG tablet take 0.1mg  by mouth daily 10/17/16  Yes [provider]  loperamide (IMODIUM) 2 MG capsule Take 4 mg by mouth daily.   Yes [provider]  memantine (NAMENDA XR) 28 MG CP24 24 hr capsule TAKE 1 CAPSULE(28 MG) BY MOUTH DAILY 04/28/17  Yes Dohmeier, Porfirio Mylar, MD  ondansetron (ZOFRAN ODT) 4 MG disintegrating tablet Take 1 tablet (4 mg total) by mouth every 8 (eight) hours as needed for nausea or vomiting. 06/19/17  Yes Long, Arlyss Repress,  MD  vitamin B-12 (CYANOCOBALAMIN) 500 MCG tablet Take 500 mcg by mouth daily.   Yes [provider]  budesonide (ENTOCORT EC) 3 MG 24 hr capsule Take by mouth 9 mg (3 capsules) daily for 1 month, then 6 mg (2 caps) daily for 2 weeks, then 3 mg daily for 2 weeks 07/08/17   Armbruster, Willaim RayasSteven P, MD  potassium chloride SA (K-DUR,KLOR-CON) 20 MEQ tablet Take 1 tablet (20 mEq total) by mouth 2 (two) times daily. Patient not taking: Reported on 07/08/2017 06/11/17 06/19/17  Nira Connardama, Pedro Eduardo, MD  tamsulosin [facility](FLOMAX) 0.4 MG CAPS capsule 1 q HS to aid stone passage 07/08/17   Mancel BaleWentz, Judah Carchi, MD    Family History Family History  Problem Relation Age of Onset  . Anemia Mother   . Alzheimer's  disease Mother   . Emphysema Mother        smoker  . Obesity Sister   . Emphysema Sister        smoker  . Coronary artery disease Brother        smoker  . Heart attack Brother   . Obesity Daughter   . Depression Daughter   . Alcohol abuse Son   . Depression Son   . Suicidality Son        10/12-commited suicide in a manic depressive episode  . Colon cancer Neg Hx   . Esophageal cancer Neg Hx   . Rectal cancer Neg Hx   . Stomach cancer Neg Hx     Social History Social History   Tobacco Use  . Smoking status: Never Smoker  . Smokeless tobacco: Never Used  Substance Use Topics  . Alcohol use: Yes    Alcohol/week: 0.6 oz    Types: 1 Shots of liquor per week    Comment: quit- had a problem w/ alcohol and has abstained for years  . Drug use: No     Allergies   Poison ivy extract   Review of Systems Review of Systems  Unable to perform ROS: Dementia     Physical Exam Updated Vital Signs BP (!) 159/85 (BP Location: Left Arm)   Pulse 75   Temp 99.1 F (37.3 C) (Oral)   Resp 18   Ht 5\' 1"  (1.549 m)   Wt 60.8 kg (134 lb)   SpO2 96%   BMI 25.32 kg/m   Physical Exam  Constitutional: She appears well-developed. No distress.  Elderly, frail  HENT:  Head: Normocephalic and atraumatic.  Right Ear: External ear normal.  Left Ear: External ear normal.  Eyes: Conjunctivae and EOM are normal. Pupils are equal, round, and reactive to light.  Neck: Normal range of motion and phonation normal. Neck supple.  Cardiovascular: Normal rate, regular rhythm and normal heart sounds.  Pulmonary/Chest: Effort normal and breath sounds normal. She exhibits no bony tenderness.  Abdominal: Soft. There is tenderness (Left lower quadrant, mild).  Musculoskeletal: Normal range of motion.  Neurological: She is alert. No cranial nerve deficit or sensory deficit. She exhibits normal muscle tone. Coordination normal.  Skin: Skin is warm, dry and intact.  Psychiatric: She has a normal mood  and affect. Her behavior is normal.  Nursing note and vitals reviewed.    ED Treatments / Results  Labs (all labs ordered are listed, but only abnormal results are displayed) Labs Reviewed  URINALYSIS, ROUTINE W REFLEX MICROSCOPIC - Abnormal; Notable for the following components:      Result Value   Hgb urine dipstick MODERATE (*)  Ketones, ur 5 (*)    Protein, ur 30 (*)    All other components within normal limits  BASIC METABOLIC PANEL - Abnormal; Notable for the following components:   Sodium 134 (*)    Potassium 3.4 (*)    Chloride 98 (*)    Glucose, Bld 113 (*)    BUN 21 (*)    Creatinine, Ser 1.11 (*)    GFR calc non Af Amer 47 (*)    GFR calc Af Amer 54 (*)    All other components within normal limits  CBC - Abnormal; Notable for the following components:   WBC 13.4 (*)    All other components within normal limits  I-STAT CG4 LACTIC ACID, ED  I-STAT CG4 LACTIC ACID, ED    EKG  EKG Interpretation None       Radiology Ct Abdomen Pelvis W Contrast  Result Date: 07/08/2017 CLINICAL DATA:  Urinary tract infection, urinary frequency and malodorous urine x1 week. EXAM: CT ABDOMEN AND PELVIS WITH CONTRAST TECHNIQUE: Multidetector CT imaging of the abdomen and pelvis was performed using the standard protocol following bolus administration of intravenous contrast. CONTRAST:  100 cc Isovue-300. COMPARISON:  01/26/2010 FINDINGS: Lower chest: Normal heart size. No pericardial effusion. Coronary arteriosclerosis along the LAD. Atelectasis and/or scarring in both lower lobes and lingula. Hepatobiliary: Uncomplicated cholelithiasis with a 7 mm gallstone along the dependent wall. Homogeneous enhancement of the liver without mass or biliary dilatation. Pancreas: Normal Spleen: Normal Adrenals/Urinary Tract: Mild to moderate left-sided hydroureteronephrosis attributable to a 4 mm calculus at the left ureterovesical junction. Mild perinephric fat stranding on the left. Punctate  nonobstructing bilateral renal calculi are demonstrated. Normal bilateral adrenal glands. The urinary bladder is nondistended and without focal mural thickening. Stomach/Bowel: Stomach is within normal limits. Appendix is not confidently identified but no findings of acute appendicitis. No evidence of bowel wall thickening, distention, or inflammatory changes. Vascular/Lymphatic: Aortic atherosclerosis. No enlarged abdominal or pelvic lymph nodes. Reproductive: Uterus and bilateral adnexa are unremarkable. Other: Tiny fat containing umbilical hernia. No abdominopelvic ascites. Musculoskeletal: Lumbar spondylosis with multilevel disc space narrowing. No acute nor suspicious osseous abnormality. IMPRESSION: 1. Mild to moderate left-sided hydroureteronephrosis attributable to a 4 mm left ureterovesical junction stone. 2. Punctate nonobstructing bilateral renal calculi. 3. 7 mm nonobstructing gallstone. 4. Coronary arteriosclerosis and aortic atherosclerosis. 5. Lumbar spondylosis. Electronically Signed   By: Tollie Eth M.D.   On: 07/08/2017 22:24    Procedures Procedures (including critical care time)  Medications Ordered in ED Medications  0.9 %  sodium chloride infusion (not administered)  tamsulosin (FLOMAX) capsule 0.4 mg (not administered)  sodium chloride 0.9 % bolus 500 mL (0 mLs Intravenous Stopped 07/08/17 2340)  iopamidol (ISOVUE-300) 61 % injection (100 mLs Intravenous Contrast Given 07/08/17 2202)     Initial Impression / Assessment and Plan / ED Course  I have reviewed the triage vital signs and the nursing notes.  Pertinent labs & imaging results that were available during my care of the patient were reviewed by me and considered in my medical decision making (see chart for details).      Patient Vitals for the past 24 hrs:  BP Temp Temp src Pulse Resp SpO2 Height Weight  07/08/17 2152 (!) 159/85 - - 75 18 96 % - -  07/08/17 2145 (!) 159/85 - - 73 - 93 % - -  07/08/17 2130 (!)  163/80 - - 69 - 94 % - -  07/08/17 2100 (!) 165/80 - -  76 - 94 % - -  07/08/17 2000 (!) 156/75 - - 72 - 94 % - -  07/08/17 1900 (!) 153/85 - - 73 - 91 % - -  07/08/17 1841 (!) 158/88 99.1 F (37.3 C) Oral 78 16 90 % - -  07/08/17 1840 - - - - - - 5\' 1"  (1.549 m) 60.8 kg (134 lb)  07/08/17 1839 (!) 150/76 99.3 F (37.4 C) - 80 20 96 % - -    23:35 Reevaluation with update and discussion. After initial assessment and treatment, an updated evaluation reveals no change in clinical status.  Findings discussed with patient's husband and the patient, all questions answered. Mancel BaleElliott Davonda Ausley      Final Clinical Impressions(s) / ED Diagnoses   Final diagnoses:  Ureteral stone  Hematuria, unspecified type    Urinary frequency, with hematuria likely secondary to distal left ureteral stone.  Patient has minimal pain associated with the stone.  Doubt UTI.  Nursing Notes Reviewed/ Care Coordinated Applicable Imaging Reviewed Interpretation of Laboratory Data incorporated into ED treatment  The patient appears reasonably screened and/or stabilized for discharge and I doubt any other medical condition or other [facility] requiring further screening, evaluation, or treatment in the ED at this time prior to discharge.  Plan: Home Medications-continue usual medications; Home Treatments-strain urine to catch stone; return here if the recommended treatment, does not improve the symptoms; Recommended follow up-urology follow-up 1 week and as needed    ED Discharge Orders        Ordered    tamsulosin (FLOMAX) 0.4 MG CAPS capsule     07/08/17 2355       Mancel BaleWentz, Dmari Schubring, MD 07/08/17 2358

## 2017-07-08 NOTE — ED Notes (Signed)
Placed Pt on Purewick catheter per nurse request. Performed Pericare on Pt prior to external catheter placement.

## 2017-07-08 NOTE — ED Triage Notes (Signed)
Patient brought by EMS from home for possible UTI. Patient has a odor and frequent urination over the past week. Patient has no complaints of pain.

## 2017-07-08 NOTE — Discharge Instructions (Signed)
Continue your current medications.  Strain your urine with a urine strainer, to try and catch the stone.  The stone that we see on the CAT scan causing the problem is about 4 mm.  You have additional stones in both kidneys, that may be a problem later on.  The medication we are prescribing to help the stone pass might make you dizzy so be careful and sit down if needed for dizziness.

## 2017-07-08 NOTE — ED Notes (Signed)
Patient transported to CT 

## 2017-07-08 NOTE — Telephone Encounter (Signed)
Dear Mr. Centola,  Alzheimer's patient's can become clumsy and incoordinated, but they usually don't get weakness all over unless a secondary cause is present. There could be an infection such as a urinary tract infect. I am also considering dehydration and possible bradycardia a factor. Bradycardia is a side effect of Aricept . Can you try to get some salty broth into her and perhaps a higher caloric milk shake or similar?  I would still like her primary doctor to be informed and have input. Aeralyn Barna, MD  

## 2017-07-08 NOTE — Telephone Encounter (Signed)
Dear Mr. Bascom LevelsReeder,  Alzheimer's patient's can become clumsy and incoordinated, but they usually don't get weakness all over unless a secondary cause is present. There could be an infection such as a urinary tract infect. I am also considering dehydration and possible bradycardia a factor. Bradycardia is a side effect of Aricept . Can you try to get some salty broth into her and perhaps a higher caloric milk shake or similar?  I would still like her primary doctor to be informed and have input. Melvyn Novasarmen Mylinda Brook, MD

## 2017-07-08 NOTE — ED Notes (Signed)
Bed: WA25 Expected date:  Expected time:  Means of arrival:  Comments: EMS weakness 

## 2017-07-09 NOTE — Telephone Encounter (Signed)
I provided pt's husband with Dr. Oliva Bustardohmeier's response via a mychart message.

## 2017-07-16 ENCOUNTER — Telehealth: Payer: Self-pay | Admitting: Gastroenterology

## 2017-07-16 NOTE — Telephone Encounter (Signed)
Spoke to patient's husband, she is not having any more diarrhea. She is taking the Budesonide as prescribed. Also she is taking two tabs of imodium in the morning. Suggested that he try backing down on the imodium to see if symptoms have resolved on Budesonide.

## 2017-07-18 ENCOUNTER — Telehealth: Payer: Self-pay | Admitting: Gastroenterology

## 2017-07-18 NOTE — Telephone Encounter (Signed)
Spoke to patient's husband, again. She is not having any more diarrhea, is taking Budesonide 9 mg daily. Asked him to back her down to taking one tablet of imodium daily, then if controls her symptoms, stop the imodium and see how that goes. Cautioned him that we do not want her to get constipated. Currently having formed stools.

## 2017-07-31 DIAGNOSIS — N202 Calculus of kidney with calculus of ureter: Secondary | ICD-10-CM | POA: Diagnosis not present

## 2017-08-13 ENCOUNTER — Encounter: Payer: Self-pay | Admitting: Neurology

## 2017-08-18 ENCOUNTER — Telehealth: Payer: Self-pay | Admitting: Gastroenterology

## 2017-08-18 NOTE — Telephone Encounter (Signed)
The pt will continue to take imodium as needed, an appt with Dr Adela LankArmbruster was also scheduled per pt request to discuss budesonide.

## 2017-08-24 ENCOUNTER — Other Ambulatory Visit: Payer: Self-pay | Admitting: Gastroenterology

## 2017-08-25 ENCOUNTER — Other Ambulatory Visit: Payer: Self-pay | Admitting: Gastroenterology

## 2017-09-03 DIAGNOSIS — Z96651 Presence of right artificial knee joint: Secondary | ICD-10-CM | POA: Diagnosis not present

## 2017-09-03 DIAGNOSIS — M1712 Unilateral primary osteoarthritis, left knee: Secondary | ICD-10-CM | POA: Diagnosis not present

## 2017-09-08 ENCOUNTER — Telehealth: Payer: Self-pay | Admitting: Gastroenterology

## 2017-09-08 NOTE — Telephone Encounter (Signed)
Spoke to patient's husband and she only has 2-3 capsules left of budesonide. Her diarrhea started coming back last night, denies any blood in stool or fever.

## 2017-09-08 NOTE — Telephone Encounter (Signed)
Spoke to patient's husband, he is wanting to know if there might be some other suggestion/medication as the budesonide is very expensive and does not appear to be working to clear this up. He is aware that it will be tomorrow before I get back with him.

## 2017-09-08 NOTE — Telephone Encounter (Signed)
Kim FanningJulie, can you clarify how much of budesonide she is taking? She should have been tapering down, I'm assuming she's taking 3mg  / day. We can increase to 6mg  / day to see if that will recapture her and she can take that until she sees me again in clinic in a few weeks. If 6mg  / day of budesonide does not control symptoms, she can increase back to 9mg  / day. We can give her 1 month supply of 6mg  / day until she sees me again in clinic to discuss further. If she is taking more than 3mg  / day at this time, please let me know. Thanks

## 2017-09-08 NOTE — Telephone Encounter (Signed)
Patient husband states the pt is almost done taking medication budesonide but she is starting to have diarrhea again and they want to know what to do.

## 2017-09-08 NOTE — Telephone Encounter (Signed)
She was at the end of treatment, 3 mg daily and only 2-3 capsules left.

## 2017-09-08 NOTE — Telephone Encounter (Signed)
Okay. I was under the impression that she had improved on budesonide. If this did not help at all then we can consider other options. Some patients need to be maintained on a low dose to keep control of symptoms. [facility] or [facility] may have much cheaper rates on budesonide which is compounded Lavonna Rua(Sheri may have pricing on this). If she wants to take immodium as needed this is also fine, she can take 1-2 tabs q AM to see if this helps slow things down. I will discuss options with her during our clinic visit but budesonide is the first line recommended medication for this issue given it usually works the best

## 2017-09-08 NOTE — Telephone Encounter (Signed)
Okay thanks. Would increase back to 6mg  / day for a few days. If no response at that dose, then back to 9mg  / day. I will reassess her in clinic. Thanks

## 2017-09-09 ENCOUNTER — Telehealth: Payer: Self-pay | Admitting: Gastroenterology

## 2017-09-09 NOTE — Telephone Encounter (Signed)
Spoke to patient's husband discussed plan, patient has not had any further episodes of diarrhea since early yesterday. Discussed using imodium 1-2 q AM. He will check to see if his insurance will cover the medication at either of those pharmacies. He will monitor symptoms and let us know about medication if would like a refill. Will keep appointment in office to discuss options.

## 2017-09-09 NOTE — Telephone Encounter (Signed)
Left message for patient's husband to call back. Let him know that budesonide is the recommended medication of choice. May try imodium 1-2 tablets every morning. Let him know that there maybe better options for pricing. Asked him to call back to discuss.

## 2017-09-09 NOTE — Telephone Encounter (Signed)
Okay thanks for letting me know. Looks like Raynelle FanningJulie spoke with them and she is feeling a bit better. She can try immodium as needed and see me in clinic for follow up. Thanks

## 2017-09-24 ENCOUNTER — Telehealth: Payer: Self-pay | Admitting: Neurology

## 2017-09-24 NOTE — Telephone Encounter (Signed)
PT HAS BEEN COMPLETED THROUGH [facility]. WE WILL HEAR BACK WITHIN 3 BUSINESS DAYS KEY: Kim [id number]

## 2017-09-25 ENCOUNTER — Telehealth: Payer: Self-pay | Admitting: Neurology

## 2017-09-25 NOTE — Telephone Encounter (Signed)
FYI Mark @ [employer] has called re: the approval of the PA for memantine (NAMENDA XR) 28 MG CP24 24 hr capsule. Loraine LericheMark stated approved effective 09-24-2017 for 1 year .  They can be reached at [phone] option 5 no call back requested

## 2017-10-02 DIAGNOSIS — N202 Calculus of kidney with calculus of ureter: Secondary | ICD-10-CM | POA: Diagnosis not present

## 2017-10-06 ENCOUNTER — Encounter: Payer: Self-pay | Admitting: Gastroenterology

## 2017-10-06 ENCOUNTER — Ambulatory Visit: Payer: Self-pay | Admitting: Gastroenterology

## 2017-10-06 ENCOUNTER — Ambulatory Visit: Payer: Medicare Other | Admitting: Gastroenterology

## 2017-10-06 VITALS — BP 124/68 | HR 92 | Ht 62.0 in | Wt 130.0 lb

## 2017-10-06 DIAGNOSIS — K52832 Lymphocytic colitis: Secondary | ICD-10-CM | POA: Diagnosis not present

## 2017-10-06 NOTE — Progress Notes (Signed)
HPI :  77 y/o female with dementia, here for a follow up visit.   The patient was last seen in March 2018 with diarrhea.  Over time her symptoms persisted and following a negative infectious evaluation she underwent a colonoscopy in November.  The exam was grossly normal however biopsies were positive for lymphocytic colitis.  She had no clear medication precipitants to cause this.  She was given a course of budesonide at 9 mg a day and was tapered over a course of.  She states she stopped this in January.  Patient and husband state this normalized her bowel habits fairly quickly.  She is having 1 bowel movement per day at this point.  No blood in her stools. She denies any abdominal pains.  States her weight is stable.  She is not using any NSAIDs.  She is using Imodium as needed for diarrhea, and her husband states she has taken this once since January overall she is feeling well without any complaints today..    Colonoscopy 07/01/2017 - positive for lymphocytic colitis, otherwise normal exam, no polyps     Past Medical History:  Diagnosis Date  . Alcohol abuse   . Alzheimer's disease 05/23/2010   ?  . ANXIETY DEPRESSION 05/23/2010  . Arthritis   . DEMENTIA 07/20/2008  . Depression    monopolar  . GERD 05/11/2007   ?  Marland Kitchen Hyperlipidemia   . Kidney stone   . Memory loss   . Microscopic colitis   . Migraine    pt denies  . OSTEOARTHRITIS, KNEE 05/23/2010  . Osteopenia   . OSTEOPOROSIS 12/17/2007  . Wrist pain, right 04/17/2011     Past Surgical History:  Procedure Laterality Date  . COLONOSCOPY    . DILATION AND CURETTAGE OF UTERUS     forty years ago  . TONSILLECTOMY    . TOTAL KNEE ARTHROPLASTY Right    Family History  Problem Relation Age of Onset  . Anemia Mother   . Alzheimer's disease Mother   . Emphysema Mother        smoker  . Obesity Sister   . Emphysema Sister        smoker  . Coronary artery disease Brother        smoker  . Heart attack Brother   .  Obesity Daughter   . Depression Daughter   . Alcohol abuse Son   . Depression Son   . Suicidality Son        10/12-commited suicide in a manic depressive episode  . Colon cancer Neg Hx   . Esophageal cancer Neg Hx   . Rectal cancer Neg Hx   . Stomach cancer Neg Hx    Social History   Tobacco Use  . Smoking status: Never Smoker  . Smokeless tobacco: Never Used  Substance Use Topics  . Alcohol use: Yes    Alcohol/week: 0.6 oz    Types: 1 Shots of liquor per week    Comment: quit- had a problem w/ alcohol and has abstained for years  . Drug use: No   Current Outpatient Medications  Medication Sig Dispense Refill  . atorvastatin (LIPITOR) 40 MG tablet TAKE 1 TABLET(40 MG) BY MOUTH DAILY 90 tablet 0  . cholecalciferol (VITAMIN D) 1000 units tablet Take 1 capsule by mouth daily.      . citalopram (CELEXA) 20 MG tablet Take 1 tablet (20 mg total) by mouth daily. 90 tablet 3  . donepezil (ARICEPT) 10 MG tablet  Take 1 tablet (10 mg total) by mouth at bedtime. 30 tablet 3  . fludrocortisone (FLORINEF) 0.1 MG tablet take 0.1mg  by mouth daily  5  . loperamide (IMODIUM) 2 MG capsule Take 2 mg by mouth as needed.     . memantine (NAMENDA XR) 28 MG CP24 24 hr capsule TAKE 1 CAPSULE(28 MG) BY MOUTH DAILY 90 capsule 2  . ondansetron (ZOFRAN ODT) 4 MG disintegrating tablet Take 1 tablet (4 mg total) by mouth every 8 (eight) hours as needed for nausea or vomiting. 20 tablet 0   Current Facility-Administered Medications  Medication Dose Route Frequency Provider Last Rate Last Dose  . 0.9 %  sodium chloride infusion  500 mL Intravenous Continuous Arlyn Bumpus, Willaim RayasSteven P, MD       Allergies  Allergen Reactions  . Poison Ivy Extract Rash     Review of Systems: All systems reviewed and negative except where noted in HPI.   Lab Results  Component Value Date   WBC 13.4 (H) 07/08/2017   HGB 13.7 07/08/2017   HCT 40.7 07/08/2017   MCV 96.4 07/08/2017   PLT 206 07/08/2017     Physical  Exam: BP 124/68 (BP Location: Left Arm, Patient Position: Sitting, Cuff Size: Normal)   Pulse 92   Ht 5\' 2"  (1.575 m) Comment: height measured without shoes  Wt 130 lb (59 kg)   BMI 23.78 kg/m  Constitutional: Pleasant,well-developed, female in no acute distress. HEENT: Normocephalic and atraumatic. Conjunctivae are normal. No scleral icterus. Neck supple.  Cardiovascular: Normal rate, regular rhythm.  Pulmonary/chest: Effort normal and breath sounds normal. No wheezing, rales or rhonchi. Abdominal: Soft, nondistended, nontender. There are no masses palpable. No hepatomegaly. Extremities: no edema Lymphadenopathy: No cervical adenopathy noted. Neurological: Alert and oriented to person place and time. Skin: Skin is warm and dry. No rashes noted. Psychiatric: Normal mood and affect. Behavior is normal.   ASSESSMENT AND PLAN: 77 year old female here for reassessment for lymphocytic colitis.  She had severe symptoms in 2018 however following her diagnosis she responded fairly quickly to budesonide, she has finished the budesonide taper and has had no recurrence of symptoms.  I discussed what lymphocytic colitis is with the patient and her husband.  We reviewed potential causes, I do not see any clear medicines to cause this at present (some specific statins and SSRIs have been associated with lymphocytic colitis, however atorvastatin and celexa are not listed as common precipitants). She denies NSAID use.  Fortunately she has responded quite well to therapy.  I discussed with them that there is risk that this could recur in the future.  She will use Imodium initially as needed for symptoms.  If its recurs or becomes severe or fails to respond to Imodium she may need another course of budesonide.  Patient and husband will keep an eye on symptoms and let me know if they recur.  Otherwise she does not need any further colon cancer screening.  All questions answered  Ileene PatrickSteven [name],  MD [facility] Pager [phone]

## 2017-10-06 NOTE — Patient Instructions (Signed)
If you are age 78 or older, your body mass index should be between 23-30. Your Body mass index is 23.78 kg/m. If this is out of the aforementioned range listed, please consider follow up with your Primary Care Provider.  If you are age 78 or younger, your body mass index should be between 19-25. Your Body mass index is 23.78 kg/m. If this is out of the aformentioned range listed, please consider follow up with your Primary Care Provider.   Thank you for entrusting me with your care and for choosing [facility], Dr. Ileene PatrickSteven Armbruster

## 2017-10-14 ENCOUNTER — Encounter: Payer: Self-pay | Admitting: Gastroenterology

## 2017-10-14 NOTE — Telephone Encounter (Signed)
Error

## 2017-10-15 DIAGNOSIS — E538 Deficiency of other specified B group vitamins: Secondary | ICD-10-CM | POA: Diagnosis not present

## 2017-10-15 DIAGNOSIS — E7800 Pure hypercholesterolemia, unspecified: Secondary | ICD-10-CM | POA: Diagnosis not present

## 2017-10-15 DIAGNOSIS — M1712 Unilateral primary osteoarthritis, left knee: Secondary | ICD-10-CM | POA: Diagnosis not present

## 2017-10-15 DIAGNOSIS — R82998 Other abnormal findings in urine: Secondary | ICD-10-CM | POA: Diagnosis not present

## 2017-10-22 DIAGNOSIS — E7800 Pure hypercholesterolemia, unspecified: Secondary | ICD-10-CM | POA: Diagnosis not present

## 2017-10-22 DIAGNOSIS — M25579 Pain in unspecified ankle and joints of unspecified foot: Secondary | ICD-10-CM | POA: Diagnosis not present

## 2017-10-22 DIAGNOSIS — F0390 Unspecified dementia without behavioral disturbance: Secondary | ICD-10-CM | POA: Diagnosis not present

## 2017-10-22 DIAGNOSIS — Z0000 Encounter for general adult medical examination without abnormal findings: Secondary | ICD-10-CM | POA: Diagnosis not present

## 2017-10-24 DIAGNOSIS — M1712 Unilateral primary osteoarthritis, left knee: Secondary | ICD-10-CM | POA: Diagnosis not present

## 2017-10-30 DIAGNOSIS — M1712 Unilateral primary osteoarthritis, left knee: Secondary | ICD-10-CM | POA: Diagnosis not present

## 2017-11-04 ENCOUNTER — Other Ambulatory Visit: Payer: Self-pay | Admitting: Neurology

## 2017-11-12 DIAGNOSIS — M810 Age-related osteoporosis without current pathological fracture: Secondary | ICD-10-CM | POA: Diagnosis not present

## 2017-11-28 DIAGNOSIS — H524 Presbyopia: Secondary | ICD-10-CM | POA: Diagnosis not present

## 2017-12-18 DIAGNOSIS — M1712 Unilateral primary osteoarthritis, left knee: Secondary | ICD-10-CM | POA: Diagnosis not present

## 2017-12-24 ENCOUNTER — Ambulatory Visit: Payer: Medicare Other | Admitting: Neurology

## 2017-12-24 ENCOUNTER — Encounter: Payer: Self-pay | Admitting: Neurology

## 2017-12-24 VITALS — BP 135/74 | HR 68 | Ht 61.0 in | Wt 134.0 lb

## 2017-12-24 DIAGNOSIS — F0281 Dementia in other diseases classified elsewhere with behavioral disturbance: Secondary | ICD-10-CM | POA: Diagnosis not present

## 2017-12-24 DIAGNOSIS — F02818 Dementia in other diseases classified elsewhere, unspecified severity, with other behavioral disturbance: Secondary | ICD-10-CM

## 2017-12-24 DIAGNOSIS — G301 Alzheimer's disease with late onset: Secondary | ICD-10-CM | POA: Diagnosis not present
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
## 2017-12-24 NOTE — Patient Instructions (Signed)
Alzheimer Disease Caregiver Guide A person who has Alzheimer disease may not be able to take care of himself or herself. He or she may need help with simple tasks. The tips below can help you care for the person. Memory loss and confusion If the person is confused or cannot remember things:  Stay calm.  Respond with a short answer.  Avoid correcting him or her in a way that sounds like scolding.  Try not to take it personally, even if he or she forgets your name.  Behavior changes The person may go through behavior changes. This can include depression, anxiety, anger, or seeing things that are not there. When behavior changes:  Try not to take behavior changes personally.  Stay calm and patient.  Do not argue or try to convince the person about a specific point.  Know that these changes are part of the disease process. Try to work through it.  Tips to lessen frustration  Make appointments and do daily tasks when the person is at his or her best.  Take your time. Simple tasks may take longer. Allow plenty of time to complete tasks.  Limit choices for the person.  Involve the person in what you are doing.  Stick to a routine.  Avoid new or crowded places, if possible.  Use simple words, short sentences, and a calm voice. Only give 1 direction at a time.  Buy clothes and shoes that are easy to put on and take off.  Let people help if they offer. Home safety  Keep floors clear. Remove rugs, magazine racks, and floor lamps.  Keep hallways well lit.  Put a handrail and nonslip mat in the bathtub or shower.  Put childproof locks on cabinets that have dangerous items in them. These items include medicine, alcohol, guns, toxic cleaning items, sharp tools, matches, or lighters.  Place locks on doors where the person cannot see or reach them. This helps the person to not wander out of the house and get lost.  Be prepared for emergencies. Keep a list of emergency phone  numbers and addresses in a handy area. Plans for the future  Talk about finances. ? Talk about money management. People with Alzheimer disease have trouble managing their money as the disease gets worse. ? Get help from professional advisors about financial and legal matters.  Talk about future care. ? Choose a power of attorney. This is someone who can make decisions for the person with Alzheimer disease when he or she can no longer do so. ? Talk about driving and when it is the right time to stop. The person's doctor can help with this. ? If the person lives alone, make sure he or she is safe. Some people need extra help at home. Other people need more care at a nursing home or care center. Support groups Some benefits of joining a support group include:  Learning ways to manage stress.  Sharing experiences with others.  Getting emotional comfort and support.  Learning new caregiving skills as the disease progresses.  Knowing what community resources are available and taking advantage of them.  Get help if:  The person has a fever.  The person has a sudden behavior change that does not get better with calming strategies.  The person is unable to manage his or her living situation.  The person threatens you or anyone else, including himself or herself.  You are no longer able to care for the person. This information is not   intended to replace advice given to you by your health care provider. Make sure you discuss any questions you have with your health care provider. Document Released: 10/28/2011 Document Revised: 01/11/2016 Document Reviewed: 09/25/2011 Elsevier Interactive Patient Education  2017 Elsevier Inc.  

## 2017-12-30 NOTE — H&P (Signed)
TOTAL KNEE ADMISSION H&P  Patient is being admitted for left total knee arthroplasty.  Subjective:  Chief Complaint:left knee pain.  HPI: Kim Hernandez, 77 y.o. female, has a history of pain and functional disability in the left knee due to arthritis and has failed non-surgical conservative treatments for greater than 12 weeks to includeNSAID's and/or analgesics, corticosteriod injections, flexibility and strengthening excercises, use of assistive devices and activity modification.  Onset of symptoms was gradual, starting 5 years ago with gradually worsening course since that time. The patient noted no past surgery on the left knee(s).  Patient currently rates pain in the left knee(s) at 8 out of 10 with activity. Patient has night pain, worsening of pain with activity and weight bearing, pain that interferes with activities of daily living, pain with passive range of motion, crepitus and joint swelling.  Patient has evidence of subchondral cysts, periarticular osteophytes and joint space narrowing by imaging studies.There is no active infection.  Patient Active Problem List   Diagnosis Date Noted  . Vasovagal syncope 06/26/2017  . Dehydration, moderate 06/26/2017  . Urinary incontinence due to cognitive impairment 06/26/2017  . History of dehydration 03/26/2017  . Arterial hypotension 03/26/2017  . Syncope and collapse 03/26/2017  . Gait instability 11/13/2016  . Hyperlipidemia 04/17/2011  . Major depression in remission (HCC) 05/23/2010  . Late onset Alzheimer's disease with behavioral disturbance 05/23/2010   Past Medical History:  Diagnosis Date  . Alcohol abuse   . Alzheimer's disease 05/23/2010   ?  . ANXIETY DEPRESSION 05/23/2010  . Arthritis   . DEMENTIA 07/20/2008  . Depression    monopolar  . GERD 05/11/2007   ?  Marland Kitchen Hyperlipidemia   . Kidney stone   . Memory loss   . Microscopic colitis   . Migraine    pt denies  . OSTEOARTHRITIS, KNEE 05/23/2010  . Osteopenia   .  OSTEOPOROSIS 12/17/2007  . Wrist pain, right 04/17/2011    Past Surgical History:  Procedure Laterality Date  . COLONOSCOPY    . DILATION AND CURETTAGE OF UTERUS     forty years ago  . TONSILLECTOMY    . TOTAL KNEE ARTHROPLASTY Right     Current Facility-Administered Medications  Medication Dose Route Frequency Provider Last Rate Last Dose  . 0.9 %  sodium chloride infusion  500 mL Intravenous Continuous Armbruster, Willaim Rayas, MD       Current Outpatient Medications  Medication Sig Dispense Refill Last Dose  . alendronate (FOSAMAX) 70 MG tablet Take 70 mg by mouth every Wednesday. Take with a full glass of water on an empty stomach.     Marland Kitchen atorvastatin (LIPITOR) 40 MG tablet TAKE 1 TABLET(40 MG) BY MOUTH DAILY (Patient taking differently: TAKE 1 TABLET(40 MG) BY MOUTH DAILY IN THE EVENING) 90 tablet 0 Taking  . cholecalciferol (VITAMIN D) 1000 units tablet Take 1,000 Units by mouth every evening.    Taking  . citalopram (CELEXA) 20 MG tablet Take 1 tablet (20 mg total) by mouth daily. (Patient taking differently: Take 20 mg by mouth every evening. ) 90 tablet 3 Taking  . donepezil (ARICEPT) 10 MG tablet Take 1 tablet (10 mg total) by mouth at bedtime. (Patient taking differently: Take 10 mg by mouth every evening. ) 30 tablet 3 Taking  . fludrocortisone (FLORINEF) 0.1 MG tablet take 0.1mg  by mouth daily in the evening  5 Taking  . memantine (NAMENDA XR) 28 MG CP24 24 hr capsule TAKE 1 CAPSULE(28 MG) BY MOUTH DAILY (  Patient taking differently: TAKE 1 CAPSULE(28 MG) BY MOUTH DAILY IN THE EVENING) 90 capsule 2 Taking  . ondansetron (ZOFRAN ODT) 4 MG disintegrating tablet Take 1 tablet (4 mg total) by mouth every 8 (eight) hours as needed for nausea or vomiting. 20 tablet 0 Taking  . vitamin B-12 (CYANOCOBALAMIN) 100 MCG tablet Take 100 mcg by mouth every evening.      Allergies  Allergen Reactions  . Poison Oak Extract Rash    Social History   Tobacco Use  . Smoking status: Never Smoker   . Smokeless tobacco: Never Used  Substance Use Topics  . Alcohol use: Yes    Alcohol/week: 0.6 oz    Types: 1 Shots of liquor per week    Comment: quit- had a problem w/ alcohol and has abstained for years    Family History  Problem Relation Age of Onset  . Anemia Mother   . Alzheimer's disease Mother   . Emphysema Mother        smoker  . Obesity Sister   . Emphysema Sister        smoker  . Coronary artery disease Brother        smoker  . Heart attack Brother   . Obesity Daughter   . Depression Daughter   . Alcohol abuse Son   . Depression Son   . Suicidality Son        10/12-commited suicide in a manic depressive episode  . Colon cancer Neg Hx   . Esophageal cancer Neg Hx   . Rectal cancer Neg Hx   . Stomach cancer Neg Hx      Review of Systems  Constitutional: Negative.   HENT: Positive for hearing loss. Negative for congestion, ear discharge, ear pain, nosebleeds, sinus pain, sore throat and tinnitus.   Eyes: Negative.   Respiratory: Negative.  Negative for stridor.   Cardiovascular: Negative.   Gastrointestinal: Negative.   Genitourinary: Positive for frequency. Negative for dysuria, flank pain, hematuria and urgency.       Positive for incontinence  Musculoskeletal: Positive for joint pain and myalgias. Negative for back pain, falls and neck pain.  Skin: Negative.   Neurological: Negative.   Endo/Heme/Allergies: Negative.   Psychiatric/Behavioral: Positive for depression and memory loss. Negative for hallucinations, substance abuse and suicidal ideas. The patient is not nervous/anxious and does not have insomnia.     Objective:  Physical Exam  Constitutional: She is oriented to person, place, and time. She appears well-developed and well-nourished. No distress.  HENT:  Head: Normocephalic and atraumatic.  Right Ear: External ear normal.  Left Ear: External ear normal.  Nose: Nose normal.  Mouth/Throat: Oropharynx is clear and moist.  Eyes: Conjunctivae  and EOM are normal. Right eye exhibits no discharge. Left eye exhibits no discharge.  Neck: Normal range of motion. Neck supple.  Cardiovascular: Normal rate, regular rhythm, normal heart sounds and intact distal pulses.  No murmur heard. Respiratory: Effort normal and breath sounds normal. No respiratory distress. She has no wheezes.  GI: Soft. Bowel sounds are normal. She exhibits no distension. There is no tenderness.  Musculoskeletal:       Right knee: Normal.       Left knee: She exhibits decreased range of motion and swelling. She exhibits no erythema. Tenderness found. Medial joint line and lateral joint line tenderness noted.  Left knee Range of motion is 5-110 degrees.  No crepitus on range of motion of the knee.  Exquisite joint line tenderness, lateral  greater than medial. Stable knee. There is significant varus deformity Incision over right total knee knee healed  Neurological: She is alert and oriented to person, place, and time. She has normal strength. No sensory deficit.  Skin: No rash noted. She is not diaphoretic. No erythema.  Psychiatric: She has a normal mood and affect. Her behavior is normal.    Vitals Ht: 5 ft 2 in  Wt: 130 lbs  BMI: 23.8  BP: 118/72 sitting R arm  Pulse: 72 bpm   Imaging Review Plain radiographs demonstrate severe degenerative joint disease of the left knee(s). The overall alignment issignificant varus. The bone quality appears to be good for age and reported activity level.   Preoperative templating of the joint replacement has been completed, documented, and submitted to the Operating Room personnel in order to optimize intra-operative equipment management.   Anticipated LOS equal to or greater than 2 midnights due to - Age 62 and older with one or more of the following:  - Obesity  - Expected need for hospital services (PT, OT, Nursing) required for safe  discharge  - Anticipated need for postoperative skilled nursing care or  inpatient rehab  - Active co-morbidities: Alzheimer's dementia    Assessment/Plan:  End stage primary osteoarthritis, left knee   The patient history, physical examination, clinical judgment of the provider and imaging studies are consistent with end stage degenerative joint disease of the left knee(s) and total knee arthroplasty is deemed medically necessary. The treatment options including medical management, injection therapy arthroscopy and arthroplasty were discussed at length. The risks and benefits of total knee arthroplasty were presented and reviewed. The risks due to aseptic loosening, infection, stiffness, patella tracking problems, thromboembolic complications and other imponderables were discussed. The patient acknowledged the explanation, agreed to proceed with the plan and consent was signed. Patient is being admitted for inpatient treatment for surgery, pain control, PT, OT, prophylactic antibiotics, VTE prophylaxis, progressive ambulation and ADL's and discharge planning. The patient is planning to be discharged home with outpatient therapy at [facility].    Therapy Plans: outpatient therapy at [facility] Disposition: home with husband Planned DVT prophylaxis: aspirin  BID DME needed: none PCP: Dr. Wylene Simmer Other: NO BLOOD OR BLOOD PRODUCTS TXA IV Alzheimer's dementia: husband health care POA  Marvella Jenning [place], [place]

## 2018-01-01 ENCOUNTER — Encounter (HOSPITAL_COMMUNITY): Payer: Self-pay

## 2018-01-01 NOTE — Patient Instructions (Addendum)
Kim Hernandez  01/01/2018   Your procedure is scheduled on: 01-05-18  Report to [facility] Main  Entrance   Report to admitting at     0800 AM    Call this number if you have problems the morning of surgery [phone]    Remember: Do not eat food or drink liquids :After Midnight.     Take these medicines the morning of surgery with A SIP OF WATER: none                                You may not have any metal on your body including hair pins and              piercings  Do not wear jewelry, make-up, lotions, powders or perfumes, deodorant             Do not wear nail polish.  Do not shave  48 hours prior to surgery.             Do not bring valuables to the hospital. [facility] IS NOT             RESPONSIBLE   FOR VALUABLES.  Contacts, dentures or bridgework may not be worn into surgery.  Leave suitcase in the car. After surgery it may be brought to your room.                 Please read over the following fact sheets you were given: _____________________________________________________________________           [facility] - Preparing for Surgery Before surgery, you can play an important role.  Because skin is not sterile, your skin needs to be as free of germs as possible.  You can reduce the number of germs on your skin by washing with CHG (chlorahexidine gluconate) soap before surgery.  CHG is an antiseptic cleaner which kills germs and bonds with the skin to continue killing germs even after washing. Please DO NOT use if you have an allergy to CHG or antibacterial soaps.  If your skin becomes reddened/irritated stop using the CHG and inform your nurse when you arrive at Short Stay. Do not shave (including legs and underarms) for at least 48 hours prior to the first CHG shower.  You may shave your face/neck. Please follow these instructions carefully:  1.  Shower with CHG Soap the night before surgery and the  morning of Surgery.  2.  If you  choose to wash your hair, wash your hair first as usual with your  normal  shampoo.  3.  After you shampoo, rinse your hair and body thoroughly to remove the  shampoo.                           4.  Use CHG as you would any other liquid soap.  You can apply chg directly  to the skin and wash                       Gently with a scrungie or clean washcloth.  5.  Apply the CHG Soap to your body ONLY FROM THE NECK DOWN.   Do not use on face/ open  Wound or open sores. Avoid contact with eyes, ears mouth and genitals (private parts).                       Wash face,  Genitals (private parts) with your normal soap.             6.  Wash thoroughly, paying special attention to the area where your surgery  will be performed.  7.  Thoroughly rinse your body with warm water from the neck down.  8.  DO NOT shower/wash with your normal soap after using and rinsing off  the CHG Soap.                9.  Pat yourself dry with a clean towel.            10.  Wear clean pajamas.            11.  Place clean sheets on your bed the night of your first shower and do not  sleep with pets. Day of Surgery : Do not apply any lotions/deodorants the morning of surgery.  Please wear clean clothes to the hospital/surgery center.  FAILURE TO FOLLOW THESE INSTRUCTIONS MAY RESULT IN THE CANCELLATION OF YOUR SURGERY PATIENT SIGNATURE_________________________________  NURSE SIGNATURE__________________________________  ________________________________________________________________________  WHAT IS A BLOOD TRANSFUSION? Blood Transfusion Information  A transfusion is the replacement of blood or some of its parts. Blood is made up of multiple cells which provide different functions.  Red blood cells carry oxygen and are used for blood loss replacement.  White blood cells fight against infection.  Platelets control bleeding.  Plasma helps clot blood.  Other blood products are available for  specialized needs, such as hemophilia or other clotting disorders. BEFORE THE TRANSFUSION  Who gives blood for transfusions?   Healthy volunteers who are fully evaluated to make sure their blood is safe. This is blood bank blood. Transfusion therapy is the safest it has ever been in the practice of medicine. Before blood is taken from a donor, a complete history is taken to make sure that person has no history of diseases nor engages in risky social behavior (examples are intravenous drug use or sexual activity with multiple partners). The donor's travel history is screened to minimize risk of transmitting infections, such as malaria. The donated blood is tested for signs of infectious diseases, such as HIV and hepatitis. The blood is then tested to be sure it is compatible with you in order to minimize the chance of a transfusion reaction. If you or a relative donates blood, this is often done in anticipation of surgery and is not appropriate for emergency situations. It takes many days to process the donated blood. RISKS AND COMPLICATIONS Although transfusion therapy is very safe and saves many lives, the main dangers of transfusion include:   Getting an infectious disease.  Developing a transfusion reaction. This is an allergic reaction to something in the blood you were given. Every precaution is taken to prevent this. The decision to have a blood transfusion has been considered carefully by your caregiver before blood is given. Blood is not given unless the benefits outweigh the risks. AFTER THE TRANSFUSION  Right after receiving a blood transfusion, you will usually feel much better and more energetic. This is especially true if your red blood cells have gotten low (anemic). The transfusion raises the level of the red blood cells which carry oxygen, and this usually causes an energy increase.  The  nurse administering the transfusion will monitor you carefully for complications. HOME CARE  INSTRUCTIONS  No special instructions are needed after a transfusion. You may find your energy is better. Speak with your caregiver about any limitations on activity for underlying diseases you may have. SEEK MEDICAL CARE IF:   Your condition is not improving after your transfusion.  You develop redness or irritation at the intravenous (IV) site. SEEK IMMEDIATE MEDICAL CARE IF:  Any of the following symptoms occur over the next 12 hours:  Shaking chills.  You have a temperature by mouth above 102 F (38.9 C), not controlled by medicine.  Chest, back, or muscle pain.  People around you feel you are not acting correctly or are confused.  Shortness of breath or difficulty breathing.  Dizziness and fainting.  You get a rash or develop hives.  You have a decrease in urine output.  Your urine turns a dark color or changes to pink, red, or brown. Any of the following symptoms occur over the next 10 days:  You have a temperature by mouth above 102 F (38.9 C), not controlled by medicine.  Shortness of breath.  Weakness after normal activity.  The white part of the eye turns yellow (jaundice).  You have a decrease in the amount of urine or are urinating less often.  Your urine turns a dark color or changes to pink, red, or brown. Document Released: 08/02/2000 Document Revised: 10/28/2011 Document Reviewed: 03/21/2008 ExitCare Patient Information 2014 [place].  _______________________________________________________________________  Incentive Spirometer  An incentive spirometer is a tool that can help keep your lungs clear and active. This tool measures how well you are filling your lungs with each breath. Taking long deep breaths may help reverse or decrease the chance of developing breathing (pulmonary) problems (especially infection) following:  A long period of time when you are unable to move or be active. BEFORE THE PROCEDURE   If the spirometer includes an  indicator to show your best effort, your nurse or respiratory therapist will set it to a desired goal.  If possible, sit up straight or lean slightly forward. Try not to slouch.  Hold the incentive spirometer in an upright position. INSTRUCTIONS FOR USE  1. Sit on the edge of your bed if possible, or sit up as far as you can in bed or on a chair. 2. Hold the incentive spirometer in an upright position. 3. Breathe out normally. 4. Place the mouthpiece in your mouth and seal your lips tightly around it. 5. Breathe in slowly and as deeply as possible, raising the piston or the ball toward the top of the column. 6. Hold your breath for 3-5 seconds or for as long as possible. Allow the piston or ball to fall to the bottom of the column. 7. Remove the mouthpiece from your mouth and breathe out normally. 8. Rest for a few seconds and repeat Steps 1 through 7 at least 10 times every 1-2 hours when you are awake. Take your time and take a few normal breaths between deep breaths. 9. The spirometer may include an indicator to show your best effort. Use the indicator as a goal to work toward during each repetition. 10. After each set of 10 deep breaths, practice coughing to be sure your lungs are clear. If you have an incision (the cut made at the time of surgery), support your incision when coughing by placing a pillow or rolled up towels firmly against it. Once you are able to get  out of bed, walk around indoors and cough well. You may stop using the incentive spirometer when instructed by your caregiver.  RISKS AND COMPLICATIONS  Take your time so you do not get dizzy or light-headed.  If you are in pain, you may need to take or ask for pain medication before doing incentive spirometry. It is harder to take a deep breath if you are having pain. AFTER USE  Rest and breathe slowly and easily.  It can be helpful to keep track of a log of your progress. Your caregiver can provide you with a simple table  to help with this. If you are using the spirometer at home, follow these instructions: [place] IF:   You are having difficultly using the spirometer.  You have trouble using the spirometer as often as instructed.  Your pain medication is not giving enough relief while using the spirometer.  You develop fever of 100.5 F (38.1 C) or higher. SEEK IMMEDIATE MEDICAL CARE IF:   You cough up bloody sputum that had not been present before.  You develop fever of 102 F (38.9 C) or greater.  You develop worsening pain at or near the incision site. MAKE SURE YOU:   Understand these instructions.  Will watch your condition.  Will get help right away if you are not doing well or get worse. Document Released: 12/16/2006 Document Revised: 10/28/2011 Document Reviewed: 02/16/2007 [facility] Patient Information 2014 [place], [place].   ________________________________________________________________________

## 2018-01-01 NOTE — Progress Notes (Signed)
Please place prders in epic preop appt 01-01-18 @ 200pm thank you

## 2018-01-01 NOTE — Progress Notes (Signed)
ekg 06-20-17 epic Clearance Dr. Richardean Chimera  12-24-17 epic Echo 10-12-16 epic  Dr. Wylene Simmer

## 2018-01-02 ENCOUNTER — Telehealth: Payer: Self-pay | Admitting: Gastroenterology

## 2018-01-02 ENCOUNTER — Other Ambulatory Visit: Payer: Self-pay

## 2018-01-02 ENCOUNTER — Encounter (HOSPITAL_COMMUNITY)
Admission: RE | Admit: 2018-01-02 | Discharge: 2018-01-02 | Disposition: A | Discharge status: Home / Self Care | Payer: Medicare Other | Source: Ambulatory Visit | Attending: Orthopedic Surgery | Admitting: Orthopedic Surgery

## 2018-01-02 ENCOUNTER — Encounter (HOSPITAL_COMMUNITY): Payer: Self-pay

## 2018-01-02 DIAGNOSIS — I9589 Other hypotension: Secondary | ICD-10-CM | POA: Diagnosis not present

## 2018-01-02 DIAGNOSIS — M1712 Unilateral primary osteoarthritis, left knee: Secondary | ICD-10-CM | POA: Diagnosis not present

## 2018-01-02 DIAGNOSIS — Z79899 Other long term (current) drug therapy: Secondary | ICD-10-CM | POA: Diagnosis not present

## 2018-01-02 DIAGNOSIS — R2689 Other abnormalities of gait and mobility: Secondary | ICD-10-CM | POA: Diagnosis not present

## 2018-01-02 DIAGNOSIS — G301 Alzheimer's disease with late onset: Secondary | ICD-10-CM | POA: Diagnosis not present

## 2018-01-02 DIAGNOSIS — R296 Repeated falls: Secondary | ICD-10-CM | POA: Diagnosis not present

## 2018-01-02 DIAGNOSIS — F1010 Alcohol abuse, uncomplicated: Secondary | ICD-10-CM | POA: Diagnosis not present

## 2018-01-02 DIAGNOSIS — R488 Other symbolic dysfunctions: Secondary | ICD-10-CM | POA: Diagnosis not present

## 2018-01-02 DIAGNOSIS — R278 Other lack of coordination: Secondary | ICD-10-CM | POA: Diagnosis not present

## 2018-01-02 DIAGNOSIS — E785 Hyperlipidemia, unspecified: Secondary | ICD-10-CM | POA: Diagnosis not present

## 2018-01-02 DIAGNOSIS — F0280 Dementia in other diseases classified elsewhere without behavioral disturbance: Secondary | ICD-10-CM | POA: Diagnosis not present

## 2018-01-02 DIAGNOSIS — Z818 Family history of other mental and behavioral disorders: Secondary | ICD-10-CM | POA: Diagnosis not present

## 2018-01-02 DIAGNOSIS — Z6824 Body mass index (BMI) 24.0-24.9, adult: Secondary | ICD-10-CM | POA: Diagnosis not present

## 2018-01-02 DIAGNOSIS — F0281 Dementia in other diseases classified elsewhere with behavioral disturbance: Secondary | ICD-10-CM | POA: Diagnosis not present

## 2018-01-02 DIAGNOSIS — H9190 Unspecified hearing loss, unspecified ear: Secondary | ICD-10-CM | POA: Diagnosis not present

## 2018-01-02 DIAGNOSIS — Z8249 Family history of ischemic heart disease and other diseases of the circulatory system: Secondary | ICD-10-CM | POA: Diagnosis not present

## 2018-01-02 DIAGNOSIS — M25762 Osteophyte, left knee: Secondary | ICD-10-CM | POA: Diagnosis not present

## 2018-01-02 DIAGNOSIS — M6281 Muscle weakness (generalized): Secondary | ICD-10-CM | POA: Diagnosis not present

## 2018-01-02 DIAGNOSIS — Z91048 Other nonmedicinal substance allergy status: Secondary | ICD-10-CM | POA: Diagnosis not present

## 2018-01-02 DIAGNOSIS — E669 Obesity, unspecified: Secondary | ICD-10-CM | POA: Diagnosis not present

## 2018-01-02 DIAGNOSIS — Z87442 Personal history of urinary calculi: Secondary | ICD-10-CM | POA: Diagnosis not present

## 2018-01-02 DIAGNOSIS — G309 Alzheimer's disease, unspecified: Secondary | ICD-10-CM | POA: Diagnosis not present

## 2018-01-02 DIAGNOSIS — G8918 Other acute postprocedural pain: Secondary | ICD-10-CM | POA: Diagnosis not present

## 2018-01-02 DIAGNOSIS — Z820 Family history of epilepsy and other diseases of the nervous system: Secondary | ICD-10-CM | POA: Diagnosis not present

## 2018-01-02 DIAGNOSIS — F418 Other specified anxiety disorders: Secondary | ICD-10-CM | POA: Diagnosis not present

## 2018-01-02 DIAGNOSIS — M810 Age-related osteoporosis without current pathological fracture: Secondary | ICD-10-CM | POA: Diagnosis not present

## 2018-01-02 DIAGNOSIS — Z96652 Presence of left artificial knee joint: Secondary | ICD-10-CM | POA: Diagnosis not present

## 2018-01-02 DIAGNOSIS — Z96651 Presence of right artificial knee joint: Secondary | ICD-10-CM | POA: Diagnosis not present

## 2018-01-02 DIAGNOSIS — K219 Gastro-esophageal reflux disease without esophagitis: Secondary | ICD-10-CM | POA: Diagnosis not present

## 2018-01-02 DIAGNOSIS — R41841 Cognitive communication deficit: Secondary | ICD-10-CM | POA: Diagnosis not present

## 2018-01-02 HISTORY — DX: Functional urinary incontinence: R39.81

## 2018-01-02 HISTORY — DX: Repeated falls: R29.6

## 2018-01-02 HISTORY — DX: Personal history of urinary calculi: Z87.442

## 2018-01-02 HISTORY — DX: Personal history of other endocrine, nutritional and metabolic disease: Z86.39

## 2018-01-02 HISTORY — DX: Syncope and collapse: R55

## 2018-01-02 HISTORY — DX: Unsteadiness on feet: R26.81

## 2018-01-02 HISTORY — DX: Hypotension, unspecified: I95.9

## 2018-01-02 LAB — CBC
HCT: 39.1 % (ref 36.0–46.0)
Hemoglobin: 12.9 g/dL (ref 12.0–15.0)
MCH: 32.2 pg (ref 26.0–34.0)
MCHC: 33 g/dL (ref 30.0–36.0)
MCV: 97.5 fL (ref 78.0–100.0)
Platelets: 244 10*3/uL (ref 150–400)
RBC: 4.01 MIL/uL (ref 3.87–5.11)
RDW: 11.9 % (ref 11.5–15.5)
WBC: 7.1 10*3/uL (ref 4.0–10.5)

## 2018-01-02 LAB — COMPREHENSIVE METABOLIC PANEL
ALT: 19 U/L (ref 14–54)
AST: 23 U/L (ref 15–41)
Albumin: 3.6 g/dL (ref 3.5–5.0)
Alkaline Phosphatase: 141 U/L — ABNORMAL HIGH (ref 38–126)
Anion gap: 10 (ref 5–15)
BUN: 17 mg/dL (ref 6–20)
CO2: 27 mmol/L (ref 22–32)
Calcium: 8.6 mg/dL — ABNORMAL LOW (ref 8.9–10.3)
Chloride: 101 mmol/L (ref 101–111)
Creatinine, Ser: 0.73 mg/dL (ref 0.44–1.00)
GFR calc Af Amer: >60 mL/min (ref >60)
GFR calc non Af Amer: >60 mL/min (ref >60)
Glucose, Bld: 102 mg/dL — ABNORMAL HIGH (ref 65–99)
Potassium: 3.3 mmol/L — ABNORMAL LOW (ref 3.5–5.1)
Sodium: 138 mmol/L (ref 135–145)
Total Bilirubin: 0.4 mg/dL (ref 0.3–1.2)
Total Protein: 6.6 g/dL (ref 6.5–8.1)

## 2018-01-02 LAB — APTT: aPTT: 37 seconds — ABNORMAL HIGH (ref 24–36)

## 2018-01-02 LAB — SURGICAL PCR SCREEN
MRSA, PCR: NEGATIVE
STAPHYLOCOCCUS AUREUS: NEGATIVE

## 2018-01-02 LAB — PROTIME-INR
INR: 0.96
Prothrombin Time: 12.7 seconds (ref 11.4–15.2)

## 2018-01-02 NOTE — Telephone Encounter (Signed)
Spoke to patient's husband, noticed that her stools have been black and tarry for last 2 weeks. No BRB, no SOB and no abdominal pain. They are currently over at [facility] pre-visit getting some labs completed for upcoming knee surgery on Monday 5/20. She has recently in last two weeks started taking Fosamax, no other new medications.

## 2018-01-02 NOTE — Telephone Encounter (Signed)
Kim Hernandez can you ask if she is taking peptobismol, iron, or anything else that could cause this. She needs a CBC to ensure she is not bleeding. If she is feeling lightheaded / dizziness or other symptoms concerning for bleeding she should seek urgent care (although it sounds like this is not the case). Please have her get a CBC done. I will be out of the office the rest of the day however if someone can keep an eye out for this.

## 2018-01-02 NOTE — Telephone Encounter (Signed)
I had asked about any other medications, and only thing new is the Fosamax. No iron. They are drawing CBC at her pre-visit today.

## 2018-01-03 LAB — NO BLOOD PRODUCTS

## 2018-01-04 MED ORDER — BUPIVACAINE LIPOSOME 1.3 % IJ SUSP
20.0000 mL | INTRAMUSCULAR | Status: DC
  Filled 2018-01-04: qty 20

## 2018-01-04 MED ORDER — TRANEXAMIC ACID 1000 MG/10ML IV SOLN
1000.0000 mg | INTRAVENOUS | Status: AC
  Administered 2018-01-05: 1000 mg via INTRAVENOUS
  Filled 2018-01-04: qty 1100

## 2018-01-05 ENCOUNTER — Inpatient Hospital Stay (HOSPITAL_COMMUNITY): Payer: Medicare Other | Admitting: Anesthesiology

## 2018-01-05 ENCOUNTER — Other Ambulatory Visit: Payer: Self-pay

## 2018-01-05 ENCOUNTER — Inpatient Hospital Stay (HOSPITAL_COMMUNITY)
Admission: RE | Admit: 2018-01-05 | Discharge: 2018-01-09 | DRG: 470 | Disposition: A | Discharge status: Skilled Nursing Facility | Payer: Medicare Other | Source: Ambulatory Visit | Attending: Orthopedic Surgery | Admitting: Orthopedic Surgery

## 2018-01-05 ENCOUNTER — Encounter (HOSPITAL_COMMUNITY): Payer: Self-pay | Admitting: *Deleted

## 2018-01-05 ENCOUNTER — Encounter (HOSPITAL_COMMUNITY)
Admission: RE | Disposition: A | Discharge status: Skilled Nursing Facility | Payer: Self-pay | Source: Ambulatory Visit | Attending: Orthopedic Surgery

## 2018-01-05 DIAGNOSIS — F418 Other specified anxiety disorders: Secondary | ICD-10-CM | POA: Diagnosis present

## 2018-01-05 DIAGNOSIS — E669 Obesity, unspecified: Secondary | ICD-10-CM | POA: Diagnosis present

## 2018-01-05 DIAGNOSIS — K219 Gastro-esophageal reflux disease without esophagitis: Secondary | ICD-10-CM | POA: Diagnosis present

## 2018-01-05 DIAGNOSIS — Z820 Family history of epilepsy and other diseases of the nervous system: Secondary | ICD-10-CM

## 2018-01-05 DIAGNOSIS — Z818 Family history of other mental and behavioral disorders: Secondary | ICD-10-CM

## 2018-01-05 DIAGNOSIS — M1712 Unilateral primary osteoarthritis, left knee: Principal | ICD-10-CM | POA: Diagnosis present

## 2018-01-05 DIAGNOSIS — Z6824 Body mass index (BMI) 24.0-24.9, adult: Secondary | ICD-10-CM | POA: Diagnosis not present

## 2018-01-05 DIAGNOSIS — Z87442 Personal history of urinary calculi: Secondary | ICD-10-CM

## 2018-01-05 DIAGNOSIS — Z91048 Other nonmedicinal substance allergy status: Secondary | ICD-10-CM | POA: Diagnosis not present

## 2018-01-05 DIAGNOSIS — M810 Age-related osteoporosis without current pathological fracture: Secondary | ICD-10-CM | POA: Diagnosis present

## 2018-01-05 DIAGNOSIS — H9190 Unspecified hearing loss, unspecified ear: Secondary | ICD-10-CM | POA: Diagnosis present

## 2018-01-05 DIAGNOSIS — Z96651 Presence of right artificial knee joint: Secondary | ICD-10-CM | POA: Diagnosis present

## 2018-01-05 DIAGNOSIS — F1010 Alcohol abuse, uncomplicated: Secondary | ICD-10-CM | POA: Diagnosis present

## 2018-01-05 DIAGNOSIS — M179 Osteoarthritis of knee, unspecified: Secondary | ICD-10-CM | POA: Diagnosis present

## 2018-01-05 DIAGNOSIS — F0281 Dementia in other diseases classified elsewhere with behavioral disturbance: Secondary | ICD-10-CM | POA: Diagnosis present

## 2018-01-05 DIAGNOSIS — Z8249 Family history of ischemic heart disease and other diseases of the circulatory system: Secondary | ICD-10-CM

## 2018-01-05 DIAGNOSIS — M25762 Osteophyte, left knee: Secondary | ICD-10-CM | POA: Diagnosis present

## 2018-01-05 DIAGNOSIS — G301 Alzheimer's disease with late onset: Secondary | ICD-10-CM | POA: Diagnosis present

## 2018-01-05 DIAGNOSIS — I9589 Other hypotension: Secondary | ICD-10-CM | POA: Diagnosis not present

## 2018-01-05 DIAGNOSIS — E785 Hyperlipidemia, unspecified: Secondary | ICD-10-CM | POA: Diagnosis not present

## 2018-01-05 DIAGNOSIS — M1710 Unilateral primary osteoarthritis, unspecified knee: Secondary | ICD-10-CM | POA: Diagnosis present

## 2018-01-05 DIAGNOSIS — F0280 Dementia in other diseases classified elsewhere without behavioral disturbance: Secondary | ICD-10-CM | POA: Diagnosis not present

## 2018-01-05 DIAGNOSIS — R296 Repeated falls: Secondary | ICD-10-CM | POA: Diagnosis present

## 2018-01-05 DIAGNOSIS — G309 Alzheimer's disease, unspecified: Secondary | ICD-10-CM | POA: Diagnosis not present

## 2018-01-05 DIAGNOSIS — Z79899 Other long term (current) drug therapy: Secondary | ICD-10-CM | POA: Diagnosis not present

## 2018-01-05 DIAGNOSIS — G8918 Other acute postprocedural pain: Secondary | ICD-10-CM | POA: Diagnosis not present

## 2018-01-05 HISTORY — PX: TOTAL KNEE ARTHROPLASTY: SHX125

## 2018-01-05 SURGERY — ARTHROPLASTY, KNEE, TOTAL
Anesthesia: Spinal | Site: Knee | Laterality: Left

## 2018-01-05 MED ORDER — ONDANSETRON HCL 4 MG/2ML IJ SOLN
INTRAMUSCULAR | Status: DC | PRN
  Administered 2018-01-05: 4 mg via INTRAVENOUS

## 2018-01-05 MED ORDER — DONEPEZIL HCL 10 MG PO TABS
10.0000 mg | ORAL_TABLET | Freq: Every evening | ORAL | Status: DC
  Administered 2018-01-05 – 2018-01-08 (×4): 10 mg via ORAL
  Filled 2018-01-05 (×5): qty 1

## 2018-01-05 MED ORDER — PROPOFOL 500 MG/50ML IV EMUL
INTRAVENOUS | Status: DC | PRN
  Administered 2018-01-05: 25 ug/kg/min via INTRAVENOUS

## 2018-01-05 MED ORDER — FENTANYL CITRATE (PF) 100 MCG/2ML IJ SOLN
INTRAMUSCULAR | Status: AC
  Filled 2018-01-05: qty 2

## 2018-01-05 MED ORDER — MIDAZOLAM HCL 2 MG/2ML IJ SOLN
INTRAMUSCULAR | Status: AC
  Filled 2018-01-05: qty 2

## 2018-01-05 MED ORDER — SODIUM CHLORIDE 0.9 % IJ SOLN
INTRAMUSCULAR | Status: DC | PRN
  Administered 2018-01-05: 60 mL

## 2018-01-05 MED ORDER — PROMETHAZINE HCL 25 MG/ML IJ SOLN: 6.2500 mg | INTRAMUSCULAR | Status: DC | PRN

## 2018-01-05 MED ORDER — FLUDROCORTISONE ACETATE 0.1 MG PO TABS
0.1000 mg | ORAL_TABLET | Freq: Every evening | ORAL | Status: DC
  Administered 2018-01-05 – 2018-01-08 (×4): 0.1 mg via ORAL
  Filled 2018-01-05 (×4): qty 1

## 2018-01-05 MED ORDER — ONDANSETRON HCL 4 MG/2ML IJ SOLN: 4.0000 mg | Freq: Four times a day (QID) | INTRAMUSCULAR | Status: DC | PRN

## 2018-01-05 MED ORDER — SODIUM CHLORIDE 0.9 % IJ SOLN
INTRAMUSCULAR | Status: AC
  Filled 2018-01-05: qty 10

## 2018-01-05 MED ORDER — ONDANSETRON 4 MG PO TBDP: 4.0000 mg | ORAL_TABLET | Freq: Three times a day (TID) | ORAL | Status: DC | PRN

## 2018-01-05 MED ORDER — FENTANYL CITRATE (PF) 100 MCG/2ML IJ SOLN
INTRAMUSCULAR | Status: DC | PRN
  Administered 2018-01-05: 25 ug via INTRAVENOUS

## 2018-01-05 MED ORDER — FENTANYL CITRATE (PF) 100 MCG/2ML IJ SOLN
50.0000 ug | INTRAMUSCULAR | Status: AC
Start: 2018-01-05 — End: 2018-01-05
  Administered 2018-01-05: 50 ug via INTRAVENOUS
  Filled 2018-01-05: qty 2

## 2018-01-05 MED ORDER — ASPIRIN EC 325 MG PO TBEC
325.0000 mg | DELAYED_RELEASE_TABLET | Freq: Two times a day (BID) | ORAL | Status: DC
  Administered 2018-01-06 – 2018-01-09 (×7): 325 mg via ORAL
  Filled 2018-01-05 (×7): qty 1

## 2018-01-05 MED ORDER — CEFAZOLIN SODIUM-DEXTROSE 2-4 GM/100ML-% IV SOLN
2.0000 g | INTRAVENOUS | Status: AC
  Administered 2018-01-05: 2 g via INTRAVENOUS
  Filled 2018-01-05: qty 100

## 2018-01-05 MED ORDER — DIPHENHYDRAMINE HCL 12.5 MG/5ML PO ELIX
12.5000 mg | ORAL_SOLUTION | ORAL | Status: DC | PRN
  Administered 2018-01-06: 25 mg via ORAL
  Filled 2018-01-05: qty 10

## 2018-01-05 MED ORDER — SODIUM CHLORIDE 0.9 % IJ SOLN
INTRAMUSCULAR | Status: AC
  Filled 2018-01-05: qty 50

## 2018-01-05 MED ORDER — DEXAMETHASONE SODIUM PHOSPHATE 10 MG/ML IJ SOLN
8.0000 mg | Freq: Once | INTRAMUSCULAR | Status: AC
  Administered 2018-01-05: 10 mg via INTRAVENOUS

## 2018-01-05 MED ORDER — SODIUM CHLORIDE 0.9 % IV SOLN
INTRAVENOUS | Status: DC
  Administered 2018-01-05: 15:00:00 via INTRAVENOUS

## 2018-01-05 MED ORDER — FLEET ENEMA 7-19 GM/118ML RE ENEM: 1.0000 | ENEMA | Freq: Once | RECTAL | Status: DC | PRN

## 2018-01-05 MED ORDER — DOCUSATE SODIUM 100 MG PO CAPS
100.0000 mg | ORAL_CAPSULE | Freq: Two times a day (BID) | ORAL | Status: DC
  Administered 2018-01-05 – 2018-01-09 (×7): 100 mg via ORAL
  Filled 2018-01-05 (×8): qty 1

## 2018-01-05 MED ORDER — BUPIVACAINE IN DEXTROSE 0.75-8.25 % IT SOLN
INTRATHECAL | Status: DC | PRN
  Administered 2018-01-05: 1.6 mL via INTRATHECAL

## 2018-01-05 MED ORDER — MEMANTINE HCL ER 28 MG PO CP24
28.0000 mg | ORAL_CAPSULE | Freq: Every evening | ORAL | Status: DC
  Administered 2018-01-05 – 2018-01-08 (×4): 28 mg via ORAL
  Filled 2018-01-05 (×4): qty 1

## 2018-01-05 MED ORDER — METHOCARBAMOL 1000 MG/10ML IJ SOLN
500.0000 mg | Freq: Four times a day (QID) | INTRAVENOUS | Status: DC | PRN
  Filled 2018-01-05: qty 5

## 2018-01-05 MED ORDER — TRANEXAMIC ACID 1000 MG/10ML IV SOLN
1000.0000 mg | Freq: Once | INTRAVENOUS | Status: AC
  Administered 2018-01-05: 1000 mg via INTRAVENOUS
  Filled 2018-01-05: qty 10

## 2018-01-05 MED ORDER — LACTATED RINGERS IV SOLN
INTRAVENOUS | Status: DC
  Administered 2018-01-05 (×2): via INTRAVENOUS

## 2018-01-05 MED ORDER — METOCLOPRAMIDE HCL 5 MG/ML IJ SOLN: 5.0000 mg | Freq: Three times a day (TID) | INTRAMUSCULAR | Status: DC | PRN

## 2018-01-05 MED ORDER — PROPOFOL 10 MG/ML IV BOLUS
INTRAVENOUS | Status: AC
  Filled 2018-01-05: qty 40

## 2018-01-05 MED ORDER — CHLORHEXIDINE GLUCONATE 4 % EX LIQD: 60.0000 mL | Freq: Once | CUTANEOUS | Status: DC

## 2018-01-05 MED ORDER — PHENOL 1.4 % MT LIQD: 1.0000 | OROMUCOSAL | Status: DC | PRN

## 2018-01-05 MED ORDER — DEXAMETHASONE SODIUM PHOSPHATE 10 MG/ML IJ SOLN
INTRAMUSCULAR | Status: AC
  Filled 2018-01-05: qty 1

## 2018-01-05 MED ORDER — ONDANSETRON HCL 4 MG PO TABS: 4.0000 mg | ORAL_TABLET | Freq: Four times a day (QID) | ORAL | Status: DC | PRN

## 2018-01-05 MED ORDER — METOCLOPRAMIDE HCL 5 MG PO TABS: 5.0000 mg | ORAL_TABLET | Freq: Three times a day (TID) | ORAL | Status: DC | PRN

## 2018-01-05 MED ORDER — ACETAMINOPHEN 325 MG PO TABS
325.0000 mg | ORAL_TABLET | Freq: Four times a day (QID) | ORAL | Status: DC | PRN
  Administered 2018-01-06 – 2018-01-08 (×2): 650 mg via ORAL
  Filled 2018-01-05 (×2): qty 2

## 2018-01-05 MED ORDER — ACETAMINOPHEN 10 MG/ML IV SOLN
1000.0000 mg | Freq: Four times a day (QID) | INTRAVENOUS | Status: DC
  Administered 2018-01-05: 1000 mg via INTRAVENOUS
  Filled 2018-01-05: qty 100

## 2018-01-05 MED ORDER — POLYETHYLENE GLYCOL 3350 17 G PO PACK
17.0000 g | PACK | Freq: Every day | ORAL | Status: DC | PRN
  Administered 2018-01-08: 17 g via ORAL
  Filled 2018-01-05: qty 1

## 2018-01-05 MED ORDER — BUPIVACAINE LIPOSOME 1.3 % IJ SUSP
INTRAMUSCULAR | Status: DC | PRN
  Administered 2018-01-05: 20 mL

## 2018-01-05 MED ORDER — ONDANSETRON HCL 4 MG/2ML IJ SOLN
INTRAMUSCULAR | Status: AC
  Filled 2018-01-05: qty 2

## 2018-01-05 MED ORDER — HYDROMORPHONE HCL 1 MG/ML IJ SOLN: 0.5000 mg | INTRAMUSCULAR | Status: DC | PRN

## 2018-01-05 MED ORDER — SODIUM CHLORIDE 0.9 % IR SOLN
Status: DC | PRN
  Administered 2018-01-05: 1000 mL

## 2018-01-05 MED ORDER — ACETAMINOPHEN 500 MG PO TABS
1000.0000 mg | ORAL_TABLET | Freq: Four times a day (QID) | ORAL | Status: AC
  Administered 2018-01-05 – 2018-01-06 (×4): 1000 mg via ORAL
  Filled 2018-01-05 (×4): qty 2

## 2018-01-05 MED ORDER — 0.9 % SODIUM CHLORIDE (POUR BTL) OPTIME
TOPICAL | Status: DC | PRN
  Administered 2018-01-05: 1000 mL

## 2018-01-05 MED ORDER — DEXAMETHASONE SODIUM PHOSPHATE 10 MG/ML IJ SOLN
10.0000 mg | Freq: Once | INTRAMUSCULAR | Status: AC
  Administered 2018-01-06: 10 mg via INTRAVENOUS
  Filled 2018-01-05: qty 1

## 2018-01-05 MED ORDER — STERILE WATER FOR IRRIGATION IR SOLN
Status: DC | PRN
  Administered 2018-01-05: 2000 mL

## 2018-01-05 MED ORDER — CITALOPRAM HYDROBROMIDE 20 MG PO TABS
20.0000 mg | ORAL_TABLET | Freq: Every evening | ORAL | Status: DC
  Administered 2018-01-05 – 2018-01-08 (×4): 20 mg via ORAL
  Filled 2018-01-05 (×5): qty 1

## 2018-01-05 MED ORDER — ROPIVACAINE HCL 7.5 MG/ML IJ SOLN
INTRAMUSCULAR | Status: DC | PRN
  Administered 2018-01-05: 20 mL via PERINEURAL

## 2018-01-05 MED ORDER — METHOCARBAMOL 500 MG PO TABS
500.0000 mg | ORAL_TABLET | Freq: Four times a day (QID) | ORAL | Status: DC | PRN
  Administered 2018-01-06: 500 mg via ORAL
  Filled 2018-01-05: qty 1

## 2018-01-05 MED ORDER — ATORVASTATIN CALCIUM 40 MG PO TABS
40.0000 mg | ORAL_TABLET | Freq: Every day | ORAL | Status: DC
  Administered 2018-01-05 – 2018-01-08 (×4): 40 mg via ORAL
  Filled 2018-01-05 (×5): qty 1

## 2018-01-05 MED ORDER — BISACODYL 10 MG RE SUPP: 10.0000 mg | Freq: Every day | RECTAL | Status: DC | PRN

## 2018-01-05 MED ORDER — CEFAZOLIN SODIUM-DEXTROSE 1-4 GM/50ML-% IV SOLN
1.0000 g | Freq: Four times a day (QID) | INTRAVENOUS | Status: AC
  Administered 2018-01-05 (×2): 1 g via INTRAVENOUS
  Filled 2018-01-05 (×2): qty 50

## 2018-01-05 MED ORDER — HYDROMORPHONE HCL 1 MG/ML IJ SOLN: 0.2500 mg | INTRAMUSCULAR | Status: DC | PRN

## 2018-01-05 MED ORDER — TRAMADOL HCL 50 MG PO TABS
50.0000 mg | ORAL_TABLET | Freq: Four times a day (QID) | ORAL | Status: DC | PRN
  Administered 2018-01-05 – 2018-01-09 (×6): 50 mg via ORAL
  Filled 2018-01-05 (×6): qty 1

## 2018-01-05 MED ORDER — MENTHOL 3 MG MT LOZG: 1.0000 | LOZENGE | OROMUCOSAL | Status: DC | PRN

## 2018-01-05 SURGICAL SUPPLY — 53 items
BAG DECANTER FOR FLEXI CONT (MISCELLANEOUS) ×1 IMPLANT
BAG SPEC THK2 15X12 ZIP CLS (MISCELLANEOUS) ×1
BAG ZIPLOCK 12X15 (MISCELLANEOUS) ×2 IMPLANT
BANDAGE ACE 6X5 VEL STRL LF (GAUZE/BANDAGES/DRESSINGS) ×2 IMPLANT
BLADE SAG 18X100X1.27 (BLADE) ×2 IMPLANT
BLADE SAW SGTL 11.0X1.19X90.0M (BLADE) ×2 IMPLANT
BOWL SMART MIX CTS (DISPOSABLE) ×2 IMPLANT
CAP KNEE TOTAL 3 SIGMA ×1 IMPLANT
CEMENT HV SMART SET (Cement) ×4 IMPLANT
COVER SURGICAL LIGHT HANDLE (MISCELLANEOUS) ×2 IMPLANT
CUFF TOURN SGL QUICK 34 (TOURNIQUET CUFF) ×2
CUFF TRNQT CYL 34X4X40X1 (TOURNIQUET CUFF) ×1 IMPLANT
DECANTER SPIKE VIAL GLASS SM (MISCELLANEOUS) ×2 IMPLANT
DRAPE U-SHAPE 47X51 STRL (DRAPES) ×2 IMPLANT
DRSG ADAPTIC 3X8 NADH LF (GAUZE/BANDAGES/DRESSINGS) ×2 IMPLANT
DRSG PAD ABDOMINAL 8X10 ST (GAUZE/BANDAGES/DRESSINGS) ×2 IMPLANT
DURAPREP 26ML APPLICATOR (WOUND CARE) ×2 IMPLANT
ELECT REM PT RETURN 15FT ADLT (MISCELLANEOUS) ×2 IMPLANT
EVACUATOR 1/8 PVC DRAIN (DRAIN) ×2 IMPLANT
GAUZE SPONGE 4X4 12PLY STRL (GAUZE/BANDAGES/DRESSINGS) ×2 IMPLANT
GLOVE BIO SURGEON STRL SZ8 (GLOVE) ×3 IMPLANT
GLOVE BIOGEL PI IND STRL 6.5 (GLOVE) IMPLANT
GLOVE BIOGEL PI IND STRL 8 (GLOVE) ×1 IMPLANT
GLOVE BIOGEL PI INDICATOR 6.5 (GLOVE) ×1
GLOVE BIOGEL PI INDICATOR 8 (GLOVE) ×1
GLOVE SURG SS PI 6.5 STRL IVOR (GLOVE) ×1 IMPLANT
GOWN STRL REUS W/TWL LRG LVL3 (GOWN DISPOSABLE) ×3 IMPLANT
GOWN STRL REUS W/TWL XL LVL3 (GOWN DISPOSABLE) IMPLANT
HANDPIECE INTERPULSE COAX TIP (DISPOSABLE) ×2
HOLDER FOLEY CATH W/STRAP (MISCELLANEOUS) ×1 IMPLANT
IMMOBILIZER KNEE 20 (SOFTGOODS) ×2
IMMOBILIZER KNEE 20 THIGH 36 (SOFTGOODS) ×1 IMPLANT
MANIFOLD NEPTUNE II (INSTRUMENTS) ×2 IMPLANT
NDL SAFETY ECLIPSE 18X1.5 (NEEDLE) IMPLANT
NEEDLE HYPO 18GX1.5 SHARP (NEEDLE)
NS IRRIG 1000ML POUR BTL (IV SOLUTION) ×2 IMPLANT
PACK TOTAL KNEE CUSTOM (KITS) ×2 IMPLANT
PADDING CAST COTTON 6X4 STRL (CAST SUPPLIES) ×5 IMPLANT
POSITIONER SURGICAL ARM (MISCELLANEOUS) ×2 IMPLANT
SET HNDPC FAN SPRY TIP SCT (DISPOSABLE) ×1 IMPLANT
STRIP CLOSURE SKIN 1/2X4 (GAUZE/BANDAGES/DRESSINGS) ×4 IMPLANT
SUT MNCRL AB 4-0 PS2 18 (SUTURE) ×2 IMPLANT
SUT STRATAFIX 0 PDS 27 VIOLET (SUTURE) ×2
SUT VIC AB 2-0 CT1 27 (SUTURE) ×6
SUT VIC AB 2-0 CT1 TAPERPNT 27 (SUTURE) ×3 IMPLANT
SUTURE STRATFX 0 PDS 27 VIOLET (SUTURE) ×1 IMPLANT
SYR 3ML LL SCALE MARK (SYRINGE) IMPLANT
SYR 50ML LL SCALE MARK (SYRINGE) ×2 IMPLANT
TRAY FOLEY CATH 14FRSI W/METER (CATHETERS) ×1 IMPLANT
TRAY FOLEY MTR SLVR 16FR STAT (SET/KITS/TRAYS/PACK) ×1 IMPLANT
WATER STERILE IRR 1000ML POUR (IV SOLUTION) ×4 IMPLANT
WRAP KNEE MAXI GEL POST OP (GAUZE/BANDAGES/DRESSINGS) ×2 IMPLANT
YANKAUER SUCT BULB TIP 10FT TU (MISCELLANEOUS) ×2 IMPLANT

## 2018-01-05 NOTE — Discharge Instructions (Signed)
° °Dr. Frank Aluisio °[facility] Specialist °[facility] °[address] °[place], [place] [address] °[phone] ° °TOTAL KNEE REPLACEMENT POSTOPERATIVE DIRECTIONS ° °Knee Rehabilitation, Guidelines Following Surgery  °Results after knee surgery are often greatly improved when you follow the exercise, range of motion and muscle strengthening exercises prescribed by your doctor. Safety measures are also important to protect the knee from further injury. Any time any of these exercises cause you to have increased pain or swelling in your knee joint, decrease the amount until you are comfortable again and slowly increase them. If you have problems or questions, call your caregiver or physical therapist for advice.  ° °HOME CARE INSTRUCTIONS  °Remove items at home which could result in a fall. This includes throw rugs or furniture in walking pathways.  °· ICE to the affected knee every three hours for 30 minutes at a time and then as needed for pain and swelling.  Continue to use ice on the knee for pain and swelling from surgery. You may notice swelling that will progress down to the foot and ankle.  This is normal after surgery.  Elevate the leg when you are not up walking on it.   °· Continue to use the breathing machine which will help keep your temperature down.  It is common for your temperature to cycle up and down following surgery, especially at night when you are not up moving around and exerting yourself.  The breathing machine keeps your lungs expanded and your temperature down. °· Do not place pillow under knee, focus on keeping the knee straight while resting ° °DIET °You may resume your previous home diet once your are discharged from the hospital. ° °DRESSING / WOUND CARE / SHOWERING °You may change your dressing every day with sterile gauze.  Please use good hand washing techniques before changing the dressing.  Do not use any lotions or creams on the incision until instructed by your  surgeon. °You may start showering once you are discharged home but do not submerge the incision under water. Just pat the incision dry and apply a dry gauze dressing on daily. °Change the surgical dressing daily and reapply a dry dressing each time. ° °ACTIVITY °Walk with your walker as instructed. °Use walker as long as suggested by your caregivers. °Avoid periods of inactivity such as sitting longer than an hour when not asleep. This helps prevent blood clots.  °You may resume a sexual relationship in one month or when given the OK by your doctor.  °You may return to work once you are cleared by your doctor.  °Do not drive a car for 6 weeks or until released by you surgeon.  °Do not drive while taking narcotics. ° °WEIGHT BEARING °Weight bearing as tolerated with assist device (walker, cane, etc) as directed, use it as long as suggested by your surgeon or therapist, typically at least 4-6 weeks. ° °POSTOPERATIVE CONSTIPATION PROTOCOL °Constipation - defined medically as fewer than three stools per week and severe constipation as less than one stool per week. ° °One of the most common issues patients have following surgery is constipation.  Even if you have a regular bowel pattern at home, your normal regimen is likely to be disrupted due to multiple reasons following surgery.  Combination of anesthesia, postoperative narcotics, change in appetite and fluid intake all can affect your bowels.  In order to avoid complications following surgery, here are some recommendations in order to help you during your recovery period. ° °  Colace (docusate) - Pick up an over-the-counter form of Colace or another stool softener and take twice a day as long as you are requiring postoperative pain medications.  Take with a full glass of water daily.  If you experience loose stools or diarrhea, hold the colace until you stool forms back up.  If your symptoms do not get better within 1 week or if they get worse, check with your  doctor. ° °Dulcolax (bisacodyl) - Pick up over-the-counter and take as directed by the product packaging as needed to assist with the movement of your bowels.  Take with a full glass of water.  Use this product as needed if not relieved by Colace only.  ° °MiraLax (polyethylene glycol) - Pick up over-the-counter to have on hand.  MiraLax is a solution that will increase the amount of water in your bowels to assist with bowel movements.  Take as directed and can mix with a glass of water, juice, soda, coffee, or tea.  Take if you go more than two days without a movement. °Do not use MiraLax more than once per day. Call your doctor if you are still constipated or irregular after using this medication for 7 days in a row. ° °If you continue to have problems with postoperative constipation, please contact the office for further assistance and recommendations.  If you experience "the worst abdominal pain ever" or develop nausea or vomiting, please contact the office immediatly for further recommendations for treatment. ° °ITCHING ° If you experience itching with your medications, try taking only a single pain pill, or even half a pain pill at a time.  You can also use Benadryl over the counter for itching or also to help with sleep.  ° °TED HOSE STOCKINGS °Wear the elastic stockings on both legs for three weeks following surgery during the day but you may remove then at night for sleeping. ° °MEDICATIONS °See your medication summary on the “After Visit Summary” that the nursing staff will review with you prior to discharge.  You may have some home medications which will be placed on hold until you complete the course of blood thinner medication.  It is important for you to complete the blood thinner medication as prescribed by your surgeon.  Continue your approved medications as instructed at time of discharge. ° °PRECAUTIONS °If you experience chest pain or shortness of breath - call 911 immediately for transfer to the  hospital emergency department.  °If you develop a fever greater that 101 F, purulent drainage from wound, increased redness or drainage from wound, foul odor from the wound/dressing, or calf pain - CONTACT YOUR SURGEON.   °                                                °FOLLOW-UP APPOINTMENTS °Make sure you keep all of your appointments after your operation with your surgeon and caregivers. You should call the office at the above phone number and make an appointment for approximately two weeks after the date of your surgery or on the date instructed by your surgeon outlined in the "After Visit Summary". ° ° °RANGE OF MOTION AND STRENGTHENING EXERCISES  °Rehabilitation of the knee is important following a knee injury or an operation. After just a few days of immobilization, the muscles of the thigh which control the knee become weakened and   shrink (atrophy). Knee exercises are designed to build up the tone and strength of the thigh muscles and to improve knee motion. Often times heat used for twenty to thirty minutes before working out will loosen up your tissues and help with improving the range of motion but do not use heat for the first two weeks following surgery. These exercises can be done on a training (exercise) mat, on the floor, on a table or on a bed. Use what ever works the best and is most comfortable for you Knee exercises include:  °Leg Lifts - While your knee is still immobilized in a splint or cast, you can do straight leg raises. Lift the leg to 60 degrees, hold for 3 sec, and slowly lower the leg. Repeat 10-20 times 2-3 times daily. Perform this exercise against resistance later as your knee gets better.  °Quad and Hamstring Sets - Tighten up the muscle on the front of the thigh (Quad) and hold for 5-10 sec. Repeat this 10-20 times hourly. Hamstring sets are done by pushing the foot backward against an object and holding for 5-10 sec. Repeat as with quad sets.  °· Leg Slides: Lying on your back,  slowly slide your foot toward your buttocks, bending your knee up off the floor (only go as far as is comfortable). Then slowly slide your foot back down until your leg is flat on the floor again. °· Angel Wings: Lying on your back spread your legs to the side as far apart as you can without causing discomfort.  °A rehabilitation program following serious knee injuries can speed recovery and prevent re-injury in the future due to weakened muscles. Contact your doctor or a physical therapist for more information on knee rehabilitation.  ° °IF YOU ARE TRANSFERRED TO A SKILLED REHAB FACILITY °If the patient is transferred to a skilled rehab facility following release from the hospital, a list of the current medications will be sent to the facility for the patient to continue.  When discharged from the skilled rehab facility, please have the facility set up the patient's Home Health Physical Therapy prior to being released. Also, the skilled facility will be responsible for providing the patient with their medications at time of release from the facility to include their pain medication, the muscle relaxants, and their blood thinner medication. If the patient is still at the rehab facility at time of the two week follow up appointment, the skilled rehab facility will also need to assist the patient in arranging follow up appointment in our office and any transportation needs. ° °MAKE SURE YOU:  °Understand these instructions.  °Get help right away if you are not doing well or get worse.  ° ° °Pick up stool softner and laxative for home use following surgery while on pain medications. °Do not submerge incision under water. °Please use good hand washing techniques while changing dressing each day. °May shower starting three days after surgery. °Please use a clean towel to pat the incision dry following showers. °Continue to use ice for pain and swelling after surgery. °Do not use any lotions or creams on the incision  until instructed by your surgeon. °

## 2018-01-05 NOTE — Anesthesia Procedure Notes (Signed)
Anesthesia Regional Block: Adductor canal block   Pre-Anesthetic Checklist: ,, timeout performed, Correct Patient, Correct Site, Correct Laterality, Correct Procedure, Correct Position, site marked, Risks and benefits discussed,  Surgical consent,  Pre-op evaluation,  At surgeon's request and post-op pain management  Laterality: Left  Prep: chloraprep       Needles:  Injection technique: Single-shot  Needle Type: Echogenic Needle     Needle Length: 9cm      Additional Needles:   Procedures:,,,, ultrasound used (permanent image in chart),,,,  Narrative:  Start time: 01/05/2018 9:48 AM End time: 01/05/2018 9:54 AM Injection made incrementally with aspirations every 5 mL.  Performed by: Personally  Anesthesiologist: Eilene Ghazi, MD  Additional Notes: Patient tolerated the procedure well without complications

## 2018-01-05 NOTE — Progress Notes (Signed)
Pt transported to room with labelled personal walker.

## 2018-01-05 NOTE — Anesthesia Preprocedure Evaluation (Signed)
Anesthesia Evaluation  Patient identified by MRN, date of birth, ID band Patient awake    Reviewed: Allergy & Precautions, NPO status , Patient's Chart, lab work & pertinent test results  Airway Mallampati: II  TM Distance: >3 FB Neck ROM: Full    Dental no notable dental hx.    Pulmonary neg pulmonary ROS,    Pulmonary exam normal breath sounds clear to auscultation       Cardiovascular negative cardio ROS Normal cardiovascular exam Rhythm:Regular Rate:Normal     Neuro/Psych Dementia alzheimers    GI/Hepatic negative GI ROS, Neg liver ROS,   Endo/Other  negative endocrine ROS  Renal/GU negative Renal ROS  negative genitourinary   Musculoskeletal negative musculoskeletal ROS (+)   Abdominal   Peds negative pediatric ROS (+)  Hematology negative hematology ROS (+)   Anesthesia Other Findings   Reproductive/Obstetrics negative OB ROS                             Anesthesia Physical Anesthesia Plan  ASA: III  Anesthesia Plan: Spinal   Post-op Pain Management:    Induction: Intravenous  PONV Risk Score and Plan: 2 and Ondansetron, Dexamethasone and Treatment may vary due to age or medical condition  Airway Management Planned: Simple Face Mask  Additional Equipment:   Intra-op Plan:   Post-operative Plan:   Informed Consent: I have reviewed the patients History and Physical, chart, labs and discussed the procedure including the risks, benefits and alternatives for the proposed anesthesia with the patient or authorized representative who has indicated his/her understanding and acceptance.   Dental advisory given  Plan Discussed with: CRNA and Surgeon  Anesthesia Plan Comments:         Anesthesia Quick Evaluation

## 2018-01-05 NOTE — Anesthesia Procedure Notes (Signed)
Anesthesia Procedure Image    

## 2018-01-05 NOTE — Anesthesia Procedure Notes (Signed)
Spinal  Start time: 01/05/2018 10:19 AM End time: 01/05/2018 10:26 AM Staffing Resident/CRNA: Victoriano Lain, CRNA Performed: resident/CRNA  Preanesthetic Checklist Completed: patient identified, site marked, surgical consent, pre-op evaluation, timeout performed, IV checked, risks and benefits discussed and monitors and equipment checked Spinal Block Patient position: sitting Prep: ChloraPrep and site prepped and draped Patient monitoring: heart rate, continuous pulse ox and blood pressure Approach: midline Location: L3-4 Injection technique: single-shot Needle Needle type: Pencan  Needle gauge: 24 G Needle length: 10 cm Assessment Sensory level: T4 Additional Notes Pt placed in sitting position for spinal placement. Spinal kit expiration date checked and verified. One attempt by CRNA. - heme, + CSF. Pt tolerated well.

## 2018-01-05 NOTE — Progress Notes (Signed)
Assisted Dr. Rose with left, ultrasound guided, adductor canal block. Side rails up, monitors on throughout procedure. See vital signs in flow sheet. Tolerated Procedure well.  

## 2018-01-05 NOTE — Op Note (Signed)
OPERATIVE REPORT-TOTAL KNEE ARTHROPLASTY   Pre-operative diagnosis- Osteoarthritis  Left knee(s)  Post-operative diagnosis- Osteoarthritis Left knee(s)  Procedure-  Left  Total Knee Arthroplasty  Surgeon- Kim Rankin. Kortney Potvin, MD  Assistant- Dimitri Ped, PA-C   Anesthesia-  Adductor canal block and spinal  EBL-25 mL   Drains Hemovac  Tourniquet time-  Total Tourniquet Time Documented: Thigh (Left) - 33 minutes Total: Thigh (Left) - 33 minutes     Complications- None  Condition-PACU - hemodynamically stable.   Brief Clinical Note  Kim Hernandez is a 77 y.o. year old female with end stage OA of her left knee with progressively worsening pain and dysfunction. She has constant pain, with activity and at rest and significant functional deficits with difficulties even with ADLs. She has had extensive non-op management including analgesics, injections of cortisone and viscosupplements, and home exercise program, but remains in significant pain with significant dysfunction. Radiographs show bone on bone arthritis medial and patellofemoral. She presents now for left Total Knee Arthroplasty.    Procedure in detail---   The patient is brought into the operating room and positioned supine on the operating table. After successful administration of  Adductor canal block and spinal,   a tourniquet is placed high on the  Left thigh(s) and the lower extremity is prepped and draped in the usual sterile fashion. Time out is performed by the operating team and then the  Left lower extremity is wrapped in Esmarch, knee flexed and the tourniquet inflated to 300 mmHg.       A midline incision is made with a ten blade through the subcutaneous tissue to the level of the extensor mechanism. A fresh blade is used to make a medial parapatellar arthrotomy. Soft tissue over the proximal medial tibia is subperiosteally elevated to the joint line with a knife and into the semimembranosus bursa with a Cobb  elevator. Soft tissue over the proximal lateral tibia is elevated with attention being paid to avoiding the patellar tendon on the tibial tubercle. The patella is everted, knee flexed 90 degrees and the ACL and PCL are removed. Findings are bone on bone medial and patellofemoral with large global osteophytes.        The drill is used to create a starting hole in the distal femur and the canal is thoroughly irrigated with sterile saline to remove the fatty contents. The 5 degree Left  valgus alignment guide is placed into the femoral canal and the distal femoral cutting block is pinned to remove 10 mm off the distal femur. Resection is made with an oscillating saw.      The tibia is subluxed forward and the menisci are removed. The extramedullary alignment guide is placed referencing proximally at the medial aspect of the tibial tubercle and distally along the second metatarsal axis and tibial crest. The block is pinned to remove 2mm off the more deficient medial  side. Resection is made with an oscillating saw. Size 3is the most appropriate size for the tibia and the proximal tibia is prepared with the modular drill and keel punch for that size.      The femoral sizing guide is placed and size 4 narrow is most appropriate. Rotation is marked off the epicondylar axis and confirmed by creating a rectangular flexion gap at 90 degrees. The size 4 cutting block is pinned in this rotation and the anterior, posterior and chamfer cuts are made with the oscillating saw. The intercondylar block is then placed and that cut is made.  Trial size 3 tibial component, trial size 4 narrow posterior stabilized femur and a 12.5  mm posterior stabilized rotating platform insert trial is placed. Full extension is achieved with excellent varus/valgus and anterior/posterior balance throughout full range of motion. The patella is everted and thickness measured to be 22  mm. Free hand resection is taken to 12 mm, a 35 template is  placed, lug holes are drilled, trial patella is placed, and it tracks normally. Osteophytes are removed off the posterior femur with the trial in place. All trials are removed and the cut bone surfaces prepared with pulsatile lavage. Cement is mixed and once ready for implantation, the size 3 tibial implant, size  4 narrow posterior stabilized femoral component, and the size 35 patella are cemented in place and the patella is held with the clamp. The trial insert is placed and the knee held in full extension. The Exparel (20 ml mixed with 60 ml saline) is injected into the extensor mechanism, posterior capsule, medial and lateral gutters and subcutaneous tissues.  All extruded cement is removed and once the cement is hard the permanent 12.5 mm posterior stabilized rotating platform insert is placed into the tibial tray.      The wound is copiously irrigated with saline solution and the extensor mechanism closed over a hemovac drain with #1 V-loc suture. The tourniquet is released for a total tourniquet time of 33  minutes. Flexion against gravity is 140 degrees and the patella tracks normally. Subcutaneous tissue is closed with 2.0 vicryl and subcuticular with running 4.0 Monocryl. The incision is cleaned and dried and steri-strips and a bulky sterile dressing are applied. The limb is placed into a knee immobilizer and the patient is awakened and transported to recovery in stable condition.      Please note that a surgical assistant was a medical necessity for this procedure in order to perform it in a safe and expeditious manner. Surgical assistant was necessary to retract the ligaments and vital neurovascular structures to prevent injury to them and also necessary for proper positioning of the limb to allow for anatomic placement of the prosthesis.   Kim Rankin Debany Vantol, MD    01/05/2018, 11:28 AM

## 2018-01-05 NOTE — Transfer of Care (Signed)
Immediate Anesthesia Transfer of Care Note  Patient: Kim Hernandez  Procedure(s) Performed: LEFT TOTAL KNEE ARTHROPLASTY (Left Knee)  Patient Location: PACU  Anesthesia Type:Regional and Spinal  Level of Consciousness: awake, alert , oriented and patient cooperative  Airway & Oxygen Therapy: Patient Spontanous Breathing and Patient connected to face mask oxygen  Post-op Assessment: Report given to RN, Post -op Vital signs reviewed and stable and Patient moving all extremities  Post vital signs: Reviewed and stable  Last Vitals:  Vitals Value Taken Time  BP    Temp    Pulse 53 01/05/2018 11:47 AM  Resp 11 01/05/2018 11:47 AM  SpO2 100 % 01/05/2018 11:47 AM  Vitals shown include unvalidated device data.  Last Pain:  Vitals:   01/05/18 1011  TempSrc:   PainSc: 0-No pain      Patients Stated Pain Goal: 4 (01/05/18 0824)  Complications: No apparent anesthesia complications

## 2018-01-05 NOTE — Interval H&P Note (Signed)
History and Physical Interval Note:  01/05/2018 8:20 AM  Kim Hernandez  has presented today for surgery, with the diagnosis of left knee osteoarthritis  The various methods of treatment have been discussed with the patient and family. After consideration of risks, benefits and other options for treatment, the patient has consented to  Procedure(s): LEFT TOTAL KNEE ARTHROPLASTY (Left) as a surgical intervention .  The patient's history has been reviewed, patient examined, no change in status, stable for surgery.  I have reviewed the patient's chart and labs.  Questions were answered to the patient's satisfaction.     Homero Fellers Yasmen Cortner

## 2018-01-05 NOTE — Anesthesia Postprocedure Evaluation (Signed)
Anesthesia Post Note  Patient: Kim Hernandez  Procedure(s) Performed: LEFT TOTAL KNEE ARTHROPLASTY (Left Knee)     Patient location during evaluation: PACU Anesthesia Type: Spinal Level of consciousness: oriented and awake and alert Pain management: pain level controlled Vital Signs Assessment: post-procedure vital signs reviewed and stable Respiratory status: spontaneous breathing, respiratory function stable and patient connected to nasal cannula oxygen Cardiovascular status: blood pressure returned to baseline and stable Postop Assessment: no headache, no backache and no apparent nausea or vomiting Anesthetic complications: no    Last Vitals:  Vitals:   01/05/18 1346 01/05/18 1430  BP: (!) 142/66 135/71  Pulse: (!) 51 (!) 58  Resp: 16 16  Temp: 36.6 C (!) 36.3 C  SpO2: 100%     Last Pain:  Vitals:   01/05/18 1430  TempSrc: Axillary  PainSc:                  Wilmarie Sparlin S

## 2018-01-05 NOTE — Progress Notes (Signed)
Volunteer at surgical waiting reports sposse left teeth in a bathroom, and when checked, the bag is gone from bathroom.  Security contacted by Agricultural consultant.

## 2018-01-06 LAB — CBC
HEMATOCRIT: 30.1 % — AB (ref 36.0–46.0)
HEMOGLOBIN: 9.9 g/dL — AB (ref 12.0–15.0)
MCH: 32 pg (ref 26.0–34.0)
MCHC: 32.9 g/dL (ref 30.0–36.0)
MCV: 97.4 fL (ref 78.0–100.0)
Platelets: 188 10*3/uL (ref 150–400)
RBC: 3.09 MIL/uL — ABNORMAL LOW (ref 3.87–5.11)
RDW: 11.8 % (ref 11.5–15.5)
WBC: 12.6 10*3/uL — AB (ref 4.0–10.5)

## 2018-01-06 LAB — BASIC METABOLIC PANEL
ANION GAP: 9 (ref 5–15)
BUN: 15 mg/dL (ref 6–20)
CHLORIDE: 105 mmol/L (ref 101–111)
CO2: 25 mmol/L (ref 22–32)
Calcium: 8.6 mg/dL — ABNORMAL LOW (ref 8.9–10.3)
Creatinine, Ser: 0.63 mg/dL (ref 0.44–1.00)
GFR calc non Af Amer: >60 mL/min (ref >60)
Glucose, Bld: 122 mg/dL — ABNORMAL HIGH (ref 65–99)
POTASSIUM: 3.1 mmol/L — AB (ref 3.5–5.1)
Sodium: 139 mmol/L (ref 135–145)

## 2018-01-06 MED ORDER — ASPIRIN 325 MG PO TBEC: 325.0000 mg | DELAYED_RELEASE_TABLET | Freq: Two times a day (BID) | ORAL | 0 refills | Status: DC

## 2018-01-06 MED ORDER — POTASSIUM CHLORIDE CRYS ER 20 MEQ PO TBCR
40.0000 meq | EXTENDED_RELEASE_TABLET | Freq: Two times a day (BID) | ORAL | Status: AC
  Administered 2018-01-06 (×2): 40 meq via ORAL
  Filled 2018-01-06 (×2): qty 2

## 2018-01-06 MED ORDER — METHOCARBAMOL 500 MG PO TABS: 500.0000 mg | ORAL_TABLET | Freq: Four times a day (QID) | ORAL | 0 refills | Status: DC | PRN

## 2018-01-06 NOTE — Evaluation (Signed)
Physical Therapy Evaluation Patient Details Name: Kim Hernandez MRN: [id number] DOB: 10/04/1940 Today's Date: 01/06/2018   History of Present Illness  77 yo female s/p L TKA 01/05/18. Hx of Alz dementia, hypotension, ETOH abuse, osteoporosis, osteopenia, falls  Clinical Impression  On eval, pt required Mod assist +2 for mobility. She walked ~15 feet with a RW. Mobility is limited by pain and pt's cognition-hx of dementia. Discussed d/c plan with husband-he stated he plans to take pt home. Will continue to follow and progress activity as tolerated. Feel pt may possibly require SNF placement however d/c plan is determined by surgeon.     Follow Up Recommendations Follow surgeon's recommendation for DC plan and follow-up therapies (PT recommends SNF)    Equipment Recommendations  None recommended by PT    Recommendations for Other Services       Precautions / Restrictions Precautions Precautions: Fall Required Braces or Orthoses: Knee Immobilizer - Left Knee Immobilizer - Left: Discontinue once straight leg raise with < 10 degree lag Restrictions Weight Bearing Restrictions: No Other Position/Activity Restrictions: WBAT      Mobility  Bed Mobility Overal bed mobility: Needs Assistance Bed Mobility: Supine to Sit     Supine to sit: Mod assist;+2 for physical assistance;+2 for safety/equipment     General bed mobility comments: Assist for trunk and bil LEs. Utilized bedpad to aid with scooting, positioning. Multimodal cueing required. Increased time.   Transfers Overall transfer level: Needs assistance Equipment used: Rolling walker (2 wheeled) Transfers: Sit to/from Stand Sit to Stand: Mod assist;+2 physical assistance;+2 safety/equipment;From elevated surface         General transfer comment: Assist to rise, stabilize, control descent. Multimodal cueing for safety, technique, hand placement.   Ambulation/Gait Ambulation/Gait assistance: Mod assist;+2  safety/equipment Ambulation Distance (Feet): 15 Feet Assistive device: Rolling walker (2 wheeled) Gait Pattern/deviations: Step-to pattern;Trunk flexed;Decreased step length - right;Decreased step length - left;Antalgic     General Gait Details: Assist to stabilize pt, maneuver with RW,  keep pt from pushing walker too far ahead, and follow with recliner for safety. Multimodal cueing required for safety, technique, sequence, distance from walker  Stairs            Wheelchair Mobility    Modified Rankin (Stroke Patients Only)       Balance                                             Pertinent Vitals/Pain Pain Assessment: Faces Faces Pain Scale: Hurts even more Pain Location: L knee Pain Descriptors / Indicators: Sore;Aching Pain Intervention(s): Limited activity within patient's tolerance;Repositioned;Ice applied    Home Living Family/patient expects to be discharged to:: Private residence Living Arrangements: Spouse/significant other Available Help at Discharge: Family;Available 24 hours/day Type of Home: House Home Access: Stairs to enter Entrance Stairs-Rails: Right Entrance Stairs-Number of Steps: 4 (front).  2 steps with no rail in back Home Layout: One level Home Equipment: Walker - 2 wheels      Prior Function Level of Independence: Needs assistance   Gait / Transfers Assistance Needed: uses RW           Hand Dominance        Extremity/Trunk Assessment   Upper Extremity Assessment Upper Extremity Assessment: Generalized weakness    Lower Extremity Assessment Lower Extremity Assessment: Generalized weakness(s/p L TKA)    Cervical / Trunk  Assessment Cervical / Trunk Assessment: Kyphotic  Communication   Communication: No difficulties  Cognition Arousal/Alertness: Awake/alert Behavior During Therapy: WFL for tasks assessed/performed Overall Cognitive Status: History of cognitive impairments - at baseline                                         General Comments      Exercises Total Joint Exercises Ankle Circles/Pumps: AROM;Both;10 reps;Supine Quad Sets: (pt unable to comprehend) Short Arc Quad: AAROM;Left;10 reps;Supine Heel Slides: AAROM;Left;10 reps;Supine Hip ABduction/ADduction: AAROM;Left;10 reps;Supine Straight Leg Raises: AAROM;Left;10 reps;Supine Goniometric ROM: ~10-55 degrees   Assessment/Plan    PT Assessment Patient needs continued PT services;Patent does not need any further PT services  PT Problem List Decreased strength;Decreased range of motion;Decreased balance;Decreased mobility;Decreased activity tolerance;Decreased knowledge of use of DME;Pain       PT Treatment Interventions DME instruction;Gait training;Functional mobility training;Therapeutic activities;Balance training;Patient/family education;Therapeutic exercise;Stair training    PT Goals (Current goals can be found in the Care Plan section)  Acute Rehab PT Goals Patient Stated Goal: home per husband PT Goal Formulation: With family Time For Goal Achievement: 01/20/18 Potential to Achieve Goals: Fair    Frequency 7X/week   Barriers to discharge        Co-evaluation               AM-PAC PT "6 Clicks" Daily Activity  Outcome Measure Difficulty turning over in bed (including adjusting bedclothes, sheets and blankets)?: Unable Difficulty moving from lying on back to sitting on the side of the bed? : Unable Difficulty sitting down on and standing up from a chair with arms (e.g., wheelchair, bedside commode, etc,.)?: Unable Help needed moving to and from a bed to chair (including a wheelchair)?: A Lot Help needed walking in hospital room?: A Lot Help needed climbing 3-5 steps with a railing? : Total 6 Click Score: 8    End of Session Equipment Utilized During Treatment: Gait belt;Left knee immobilizer Activity Tolerance: Patient limited by pain(limited by cognition) Patient left: in  chair;with call bell/phone within reach;with chair alarm set;with family/visitor present   PT Visit Diagnosis: Other abnormalities of gait and mobility (R26.89);History of falling (Z91.81);Difficulty in walking, not elsewhere classified (R26.2);Pain;Muscle weakness (generalized) (M62.81) Pain - Right/Left: Left Pain - part of body: Knee    Time: 4098-1191 PT Time Calculation (min) (ACUTE ONLY): 28 min   Charges:   PT Evaluation $PT Eval Moderate Complexity: 1 Mod PT Treatments $Gait Training: 8-22 mins   PT G Codes:        Rebeca Alert, MPT Pager: [phone]

## 2018-01-06 NOTE — Progress Notes (Signed)
   Subjective: 1 Day Post-Op Procedure(s) (LRB): LEFT TOTAL KNEE ARTHROPLASTY (Left) Patient reports pain as moderate.   Patient seen in rounds with Dr. Lequita Halt. Patient is well, and has had no acute complaints or problems. Voiding and positive flatus. We will start therapy today.  Plan is to go Home after hospital stay.  Objective: Vital signs in last 24 hours: Temp:  [97.3 F (36.3 C)-98.6 F (37 C)] 98 F (36.7 C) (05/21 0553) Pulse Rate:  [40-75] 56 (05/21 0553) Resp:  [8-19] 16 (05/20 1739) BP: (106-167)/(63-89) 116/77 (05/21 0553) SpO2:  [95 %-100 %] 100 % (05/21 0553) Weight:  [59.4 kg (131 lb)] 59.4 kg (131 lb) (05/20 0824)  Intake/Output from previous day:  Intake/Output Summary (Last 24 hours) at 01/06/2018 0735 Last data filed at 01/06/2018 0600 Gross per 24 hour  Intake 2866.63 ml  Output 2075 ml  Net 791.63 ml    Labs: Recent Labs    01/06/18 0540  HGB 9.9*   Recent Labs    01/06/18 0540  WBC 12.6*  RBC 3.09*  HCT 30.1*  PLT 188   Recent Labs    01/06/18 0540  NA 139  K 3.1*  CL 105  CO2 25  BUN 15  CREATININE 0.63  GLUCOSE 122*  CALCIUM 8.6*   EXAM General - Patient is Alert and Oriented Extremity - Neurologically intact Neurovascular intact Sensation intact distally Intact pulses distally Dressing - dressing C/D/I Motor Function - intact, moving foot and toes well on exam. Hemovac pulled without difficulty.  Past Medical History:  Diagnosis Date  . Alcohol abuse   . Alzheimer's disease 05/23/2010  . ANXIETY DEPRESSION 05/23/2010  . Arterial hypotension   . Arthritis   . DEMENTIA 07/20/2008  . Depression    monopolar  . Gait instability   . GERD 05/11/2007   ?  Marland Kitchen History of dehydration   . History of kidney stones   . Hyperlipidemia   . Memory loss   . Microscopic colitis   . Migraine    pt denies  . Multiple falls   . OSTEOARTHRITIS, KNEE 05/23/2010  . Osteopenia   . OSTEOPOROSIS 12/17/2007  . Urinary incontinence due  to cognitive impairment   . Vasovagal syncope 06/26/2017  . Wrist pain, right 04/17/2011    Assessment/Plan: 1 Day Post-Op Procedure(s) (LRB): LEFT TOTAL KNEE ARTHROPLASTY (Left) Principal Problem:   OA (osteoarthritis) of knee  Estimated body mass index is 24.75 kg/m as calculated from the following:   Height as of this encounter:  (1.549 m).   Weight as of this encounter: 59.4 kg (131 lb). Advance diet Up with therapy   Anticipated LOS equal to or greater than 2 midnights due to - Age 78 and older with one or more of the following:  - Obesity  - Expected need for hospital services (PT, OT, Nursing) required for safe  discharge  - Anticipated need for postoperative skilled nursing care or inpatient rehab  - Active co-morbidities: Dementia OR   - Unanticipated findings during/Post Surgery: None  - Patient is a high risk of re-admission due to: None  DVT Prophylaxis - Aspirin Weight-Bearing as tolerated D/C O2 and Pulse OX and try on Room Air  Potassium 3.1 on 05/21, down from 3.3. Will give potassium chloride 40 mEq BID today. Possible discharge to home tomorrow with husband if meeting therapy goals.  Dimitri Ped, PA-C Orthopaedic Surgery 01/06/2018, 7:35 AM

## 2018-01-06 NOTE — Progress Notes (Addendum)
Physical Therapy Treatment Patient Details Name: Kim Hernandez MRN: [id number] DOB: Feb 14, 1941 Today's Date: 01/06/2018    History of Present Illness 77 yo female s/p L TKA 01/05/18. Hx of Alz dementia, hypotension, ETOH abuse, osteoporosis, osteopenia, falls    PT Comments    Pt continues to require Mod assist +2 for safe mobility. It took ~40 minutes to get pt to walk 12 feet. Max multimodal cueing required. Mobility appears to be limited by pain-pt doesn't seem to have had any pain meds on today. Very unsafe RW use. High risk for falls. Husband is concerned about d/c plan. Encouraged him to discuss this with surgeon since that is who will ultimately determine the d/c plan. Feel safest plan may be ST rehab if MD is agreeable. Will continue to progress activity as tolerated.     Follow Up Recommendations  Follow surgeon's recommendation for DC plan and follow-up therapies (PT recommends SNF)     Equipment Recommendations  None recommended by PT    Recommendations for Other Services       Precautions / Restrictions Precautions Precautions: Fall Required Braces or Orthoses: Knee Immobilizer - Left Knee Immobilizer - Left: Discontinue once straight leg raise with < 10 degree lag Restrictions Weight Bearing Restrictions: No Other Position/Activity Restrictions: WBAT    Mobility  Bed Mobility Overal bed mobility: Needs Assistance Bed Mobility: Sit to Supine       Sit to supine: Mod assist;+2 for physical assistance;+2 for safety/equipment;HOB elevated   General bed mobility comments: Assist for trunk and bil LEs. Utilized bedpad to aid with scooting, positioning. Multimodal cueing required. Increased time.   Transfers Overall transfer level: Needs assistance Equipment used: Rolling walker (2 wheeled) Transfers: Sit to/from Stand Sit to Stand: Mod assist;+2 physical assistance;+2 safety/equipment         General transfer comment: Assist to rise, stabilize, control  descent. Multimodal cueing for safety, technique, hand placement.   Ambulation/Gait Ambulation/Gait assistance: Mod assist;+2 safety/equipment Ambulation Distance (Feet): 12 Feet Assistive device: Rolling walker (2 wheeled) Gait Pattern/deviations: Step-to pattern;Trunk flexed;Decreased step length - right;Decreased step length - left;Antalgic     General Gait Details: Max encouragement required for pt to walk a short distance from recliner to bed. Assist to stabilize pt, maneuver safely with RW, keep pt from pushing walker too far ahead. Max multimodal cueing required for safety, technique, sequence, distance from walker.    Stairs             Wheelchair Mobility    Modified Rankin (Stroke Patients Only)       Balance Overall balance assessment: Needs assistance         Standing balance support: Bilateral upper extremity supported Standing balance-Leahy Scale: Poor                              Cognition Arousal/Alertness: Awake/alert Behavior During Therapy: WFL for tasks assessed/performed Overall Cognitive Status: History of cognitive impairments - at baseline                                        Exercises      General Comments        Pertinent Vitals/Pain Pain Assessment: Faces Faces Pain Scale: Hurts whole lot Pain Location: L knee Pain Descriptors / Indicators: Sore;Aching Pain Intervention(s): Limited activity within patient's tolerance;Repositioned;Ice applied  Home Living                      Prior Function            PT Goals (current goals can now be found in the care plan section) Progress towards PT goals: Progressing toward goals    Frequency    7X/week      PT Plan Current plan remains appropriate    Co-evaluation              AM-PAC PT "6 Clicks" Daily Activity  Outcome Measure  Difficulty turning over in bed (including adjusting bedclothes, sheets and blankets)?:  Unable Difficulty moving from lying on back to sitting on the side of the bed? : Unable Difficulty sitting down on and standing up from a chair with arms (e.g., wheelchair, bedside commode, etc,.)?: Unable Help needed moving to and from a bed to chair (including a wheelchair)?: A Lot Help needed walking in hospital room?: A Lot Help needed climbing 3-5 steps with a railing? : Total 6 Click Score: 8    End of Session Equipment Utilized During Treatment: Gait belt;Left knee immobilizer Activity Tolerance: Patient limited by pain(limited by cognition) Patient left: in bed;with call bell/phone within reach;with bed alarm set;with family/visitor present   PT Visit Diagnosis: Other abnormalities of gait and mobility (R26.89);History of falling (Z91.81);Difficulty in walking, not elsewhere classified (R26.2);Pain;Muscle weakness (generalized) (M62.81) Pain - Right/Left: Left Pain - part of body: Knee     Time: 1400-1444 PT Time Calculation (min) (ACUTE ONLY): 44 min  Charges:  $Gait Training: 38-52 mins                    G Codes:         Rebeca Alert, MPT Pager: [phone]

## 2018-01-06 NOTE — Progress Notes (Signed)
Discharge planning, no HH needs identified. Plan for OP PT, has DME. [phone] 

## 2018-01-07 ENCOUNTER — Encounter: Payer: Self-pay | Admitting: Neurology

## 2018-01-07 LAB — CBC
HEMATOCRIT: 33 % — AB (ref 36.0–46.0)
HEMOGLOBIN: 10.8 g/dL — AB (ref 12.0–15.0)
MCH: 31.6 pg (ref 26.0–34.0)
MCHC: 32.7 g/dL (ref 30.0–36.0)
MCV: 96.5 fL (ref 78.0–100.0)
Platelets: 198 10*3/uL (ref 150–400)
RBC: 3.42 MIL/uL — ABNORMAL LOW (ref 3.87–5.11)
RDW: 12.2 % (ref 11.5–15.5)
WBC: 13.3 10*3/uL — ABNORMAL HIGH (ref 4.0–10.5)

## 2018-01-07 LAB — BASIC METABOLIC PANEL
Anion gap: 10 (ref 5–15)
BUN: 8 mg/dL (ref 6–20)
CHLORIDE: 106 mmol/L (ref 101–111)
CO2: 24 mmol/L (ref 22–32)
CREATININE: 0.55 mg/dL (ref 0.44–1.00)
Calcium: 8.4 mg/dL — ABNORMAL LOW (ref 8.9–10.3)
GFR calc non Af Amer: >60 mL/min (ref >60)
GLUCOSE: 104 mg/dL — AB (ref 65–99)
Potassium: 3.3 mmol/L — ABNORMAL LOW (ref 3.5–5.1)
Sodium: 140 mmol/L (ref 135–145)

## 2018-01-07 MED ORDER — TRAMADOL HCL 50 MG PO TABS: 50.0000 mg | ORAL_TABLET | Freq: Four times a day (QID) | ORAL | 0 refills | Status: DC | PRN

## 2018-01-07 MED ORDER — POTASSIUM CHLORIDE CRYS ER 20 MEQ PO TBCR
40.0000 meq | EXTENDED_RELEASE_TABLET | Freq: Two times a day (BID) | ORAL | Status: AC
  Administered 2018-01-07 – 2018-01-08 (×4): 40 meq via ORAL
  Filled 2018-01-07 (×4): qty 2

## 2018-01-07 MED ORDER — POTASSIUM CHLORIDE CRYS ER 20 MEQ PO TBCR: 20.0000 meq | EXTENDED_RELEASE_TABLET | Freq: Two times a day (BID) | ORAL | 0 refills | Status: DC

## 2018-01-07 NOTE — Progress Notes (Signed)
Physical Therapy Treatment Patient Details Name: BREE HEINZELMAN MRN: [id number] DOB: 05/10/1941 Today's Date: 01/07/2018    History of Present Illness 77 yo female s/p L TKA 01/05/18. Hx of Alz dementia, hypotension, ETOH abuse, osteoporosis, osteopenia, falls    PT Comments    POD # 2 am session General bed mobility comments: required increased assist and time to transfer partially from supine to EOB.  Resistant, posterior push and even unable to static sit EOB without correction/physical Assist.   Severe posterior lean/push and B LE's in extension General transfer comment: great difficulty due to cognitive impairement, unable to self weight shift forward to initiate sit to stand.  Required + 2 Max Assist to rise from elevated.  Still present with severe posterior lean/push and B LE's "skiing"   Unable to initate functional steps.  Assisted to recliner only, unable to attempt amb.    Follow Up Recommendations  Follow surgeon's recommendation for DC plan and follow-up therapies(LPT rec SNF)     Equipment Recommendations  None recommended by PT    Recommendations for Other Services       Precautions / Restrictions Precautions Precautions: Fall Required Braces or Orthoses: Knee Immobilizer - Left Knee Immobilizer - Left: Discontinue once straight leg raise with < 10 degree lag Restrictions Weight Bearing Restrictions: No Other Position/Activity Restrictions: WBAT    Mobility  Bed Mobility Overal bed mobility: Needs Assistance Bed Mobility: Supine to Sit     Supine to sit: Max assist;Total assist     General bed mobility comments: required increased assist and time to transfer partially from supine to EOB.  Resistant, posterior push and even unable to static sit EOB without correction/physical Assist.   Severe posterior lean/push and B LE's in extension  Transfers Overall transfer level: Needs assistance Equipment used: Rolling walker (2 wheeled) Transfers: Sit to/from  [employer] Sit to Stand: Mod assist;+2 physical assistance;+2 safety/equipment Stand pivot transfers: Total assist;+2 physical assistance;+2 safety/equipment       General transfer comment: great difficulty due to cognitive impairement, unable to self weight shift forward to initiate sit to stand.  Required + 2 Max Assist to rise from elevated.  Still present with severe posterior lean/push and B LE's "skiing"   Unable to initate functional steps.  Assisted to recliner only, unable to attempt amb.    Ambulation/Gait                 Stairs             Wheelchair Mobility    Modified Rankin (Stroke Patients Only)       Balance Overall balance assessment: Needs assistance Sitting-balance support: Bilateral upper extremity supported Sitting balance-Leahy Scale: Poor     Standing balance support: Bilateral upper extremity supported Standing balance-Leahy Scale: Zero                              Cognition Arousal/Alertness: Awake/alert Behavior During Therapy: WFL for tasks assessed/performed Overall Cognitive Status: Difficult to assess                                 General Comments: required repaet one word functional/visual commands          Exercises      General Comments        Pertinent Vitals/Pain Pain Assessment: Faces Faces Pain Scale: Hurts whole lot Pain  Location: L knee Pain Descriptors / Indicators: Grimacing;Moaning Pain Intervention(s): Monitored during session;Premedicated before session;Repositioned    Home Living Family/patient expects to be discharged to:: Private residence Living Arrangements: Spouse/significant other Available Help at Discharge: Family;Available 24 hours/day Type of Home: House Home Access: Stairs to enter Entrance Stairs-Rails: Right Home Layout: One level Home Equipment: Environmental consultant - 2 wheels      Prior Function Level of Independence: Needs assistance  Gait /  Transfers Assistance Needed: uses RW       PT Goals (current goals can now be found in the care plan section) Acute Rehab PT Goals Patient Stated Goal: home per husband Progress towards PT goals: Progressing toward goals    Frequency    7X/week      PT Plan Current plan remains appropriate    Co-evaluation              AM-PAC PT "6 Clicks" Daily Activity  Outcome Measure  Difficulty turning over in bed (including adjusting bedclothes, sheets and blankets)?: Unable Difficulty moving from lying on back to sitting on the side of the bed? : Unable Difficulty sitting down on and standing up from a chair with arms (e.g., wheelchair, bedside commode, etc,.)?: Unable Help needed moving to and from a bed to chair (including a wheelchair)?: Total Help needed walking in hospital room?: Total Help needed climbing 3-5 steps with a railing? : Total 6 Click Score: 6    End of Session Equipment Utilized During Treatment: Gait belt Activity Tolerance: Other (comment)(limited by cognition)     PT Visit Diagnosis: Other abnormalities of gait and mobility (R26.89);History of falling (Z91.81);Difficulty in walking, not elsewhere classified (R26.2);Pain;Muscle weakness (generalized) (M62.81) Pain - Right/Left: Left Pain - part of body: Knee     Time: 6962-9528 PT Time Calculation (min) (ACUTE ONLY): 18 min  Charges:  $Therapeutic Activity: 8-22 mins                    G Codes:       Felecia Shelling  PTA [facility]  Acute  Rehab Pager      [phone]

## 2018-01-07 NOTE — Progress Notes (Addendum)
   Subjective: 2 Days Post-Op Procedure(s) (LRB): LEFT TOTAL KNEE ARTHROPLASTY (Left) Patient reports pain as moderate.   Patient seen in rounds for Dr. Lequita Halt. Patient is progressing slowly due dementia. Reports that she is having some pain in the left knee but unable to identify to what scale. She reports good urine output. Reports flatus. No BM yet. RN has obviously had some issues with communication with the patient about her needs due to dementia.    Objective: Vital signs in last 24 hours: Temp:  [97.8 F (36.6 C)-99.2 F (37.3 C)] 98.3 F (36.8 C) (05/22 0604) Pulse Rate:  [62-89] 67 (05/22 0604) Resp:  [14-19] 19 (05/22 0604) BP: (129-177)/(70-93) 145/93 (05/22 0604) SpO2:  [95 %-96 %] 96 % (05/22 0604)  Intake/Output from previous day:  Intake/Output Summary (Last 24 hours) at 01/07/2018 0728 Last data filed at 01/07/2018 4540 Gross per 24 hour  Intake 1500 ml  Output 1025 ml  Net 475 ml     Labs: Recent Labs    01/06/18 0540 01/07/18 0546  HGB 9.9* 10.8*   Recent Labs    01/06/18 0540 01/07/18 0546  WBC 12.6* 13.3*  RBC 3.09* 3.42*  HCT 30.1* 33.0*  PLT 188 198   Recent Labs    01/06/18 0540 01/07/18 0546  NA 139 140  K 3.1* 3.3*  CL 105 106  CO2 25 24  BUN 15 8  CREATININE 0.63 0.55  GLUCOSE 122* 104*  CALCIUM 8.6* 8.4*    EXAM General - Patient is Alert and Oriented in the moment but does not recall interaction shortly after Extremity - Sensation intact distally Intact pulses distally Dorsiflexion/Plantar flexion intact No cellulitis present Compartment soft Dressing/Incision - clean, dry, no drainage Motor Function - intact, moving foot and toes well on exam.   Past Medical History:  Diagnosis Date  . Alcohol abuse   . Alzheimer's disease 05/23/2010  . ANXIETY DEPRESSION 05/23/2010  . Arterial hypotension   . Arthritis   . DEMENTIA 07/20/2008  . Depression    monopolar  . Gait instability   . GERD 05/11/2007   ?  Marland Kitchen History of  dehydration   . History of kidney stones   . Hyperlipidemia   . Memory loss   . Microscopic colitis   . Migraine    pt denies  . Multiple falls   . OSTEOARTHRITIS, KNEE 05/23/2010  . Osteopenia   . OSTEOPOROSIS 12/17/2007  . Urinary incontinence due to cognitive impairment   . Vasovagal syncope 06/26/2017  . Wrist pain, right 04/17/2011    Assessment/Plan: 2 Days Post-Op Procedure(s) (LRB): LEFT TOTAL KNEE ARTHROPLASTY (Left) Principal Problem:   OA (osteoarthritis) of knee  Estimated body mass index is 24.75 kg/m as calculated from the following:   Height as of this encounter:  (1.549 m).   Weight as of this encounter: 59.4 kg (131 lb). Advance diet Up with therapy  DVT Prophylaxis - Aspirin Weight-Bearing as tolerated   Will continue to work with therapy today. Will discuss discharge plan again with Dr. Lequita Halt. Dementia is a severe barrier to DC home without adequate family support. Will decide whether or not to continue with plan for outpatient therapy or SNF. Will recheck potassium this morning.   Dimitri Ped, PA-C Orthopaedic Surgery 01/07/2018, 7:28 AM

## 2018-01-07 NOTE — Progress Notes (Addendum)
Physical Therapy Treatment Patient Details Name: Kim Hernandez MRN: [id number] DOB: 04-Feb-1941 Today's Date: 01/07/2018    History of Present Illness 77 yo female s/p L TKA 01/05/18. Hx of Alz dementia, hypotension, ETOH abuse, osteoporosis, osteopenia, falls    PT Comments    POD # 2   General transfer comment: poor initation to commands due to cognitve status.  pt unable to self scoot to edge of chair and required hand over hand cueing to attempt sit to stand from recliner.  Required hand over hand cueing to grasp walker.  SEVERE pushing/posterior lean with resistance and fear "I'm falling:.  Also required + 2 assist to perform stand to sit as pt was rigid and resisting.   Spouse present during session and is very concerned about pt's post op AMS.  He was hoping to take pt home to her familiar surroundings but he can not physically assist her.  Spouse would like to speak to a Case Manager re SNF process, cost, etc.    Reported to RN.    Follow Up Recommendations  Follow surgeon's recommendation for DC plan and follow-up therapies OP  Physical Therapy recommendation SNF   Equipment Recommendations  None recommended by PT    Recommendations for Other Services       Precautions / Restrictions Precautions Precautions: Fall Precaution Comments: performed better without KI (unable to tolerate) Required Braces or Orthoses: Knee Immobilizer - Left Knee Immobilizer - Left: Discontinue once straight leg raise with < 10 degree lag Restrictions Weight Bearing Restrictions: No Other Position/Activity Restrictions: WBAT    Mobility  Bed Mobility Overal bed mobility: Needs Assistance Bed Mobility: Sit to Supine     Supine to sit: Max assist;Total assist Sit to supine: Total assist;+2 for physical assistance;+2 for safety/equipment   General bed mobility comments: assisted back to bed Total Assist + 2 as pt was unable to initate even scoot backward on bed when sitting EOB    Transfers Overall transfer level: Needs assistance Equipment used: Rolling walker (2 wheeled) Transfers: Sit to/from [employer] Sit to Stand: Max assist;+2 physical assistance;+2 safety/equipment Stand pivot transfers: Total assist;+2 physical assistance;+2 safety/equipment       General transfer comment: poor initation to commands due to cognitve status.  pt unable to self scoot to edge of chair and required hand over hand cueing to attempt sit to stand from recliner.  Required hand over hand cueing to grasp walker.  SEVERE pushing/posterior lean with resistance and fear "I'm falling:.  Also required + 2 assist to perform stand to sit as pt was rigid and resisting.    Ambulation/Gait Ambulation/Gait assistance: Max assist;Total assist;+2 physical assistance;+2 safety/equipment Ambulation Distance (Feet): 9 Feet Assistive device: Rolling walker (2 wheeled) Gait Pattern/deviations: Step-to pattern;Decreased step length - right;Decreased step length - left     General Gait Details: Total Assist to progress gait with severe posterior pushing and extensor tone throughout.  Cognitive impaired, fear and inability to process commands.  + 2 side by side with 3rd assist (spouse) following with recliner.     Stairs             Wheelchair Mobility    Modified Rankin (Stroke Patients Only)       Balance Overall balance assessment: Needs assistance Sitting-balance support: Bilateral upper extremity supported Sitting balance-Leahy Scale: Poor     Standing balance support: Bilateral upper extremity supported Standing balance-Leahy Scale: Zero  Cognition Arousal/Alertness: Awake/alert Behavior During Therapy: WFL for tasks assessed/performed Overall Cognitive Status: Difficult to assess                                 General Comments: required repeat one word functional/visual commands           Exercises      General Comments        Pertinent Vitals/Pain Pain Assessment: Faces Faces Pain Scale: Hurts whole lot Pain Location: L knee Pain Descriptors / Indicators: Grimacing;Moaning Pain Intervention(s): Monitored during session;Premedicated before session;Repositioned    Home Living Family/patient expects to be discharged to:: Private residence Living Arrangements: Spouse/significant other Available Help at Discharge: Family;Available 24 hours/day Type of Home: House Home Access: Stairs to enter Entrance Stairs-Rails: Right Home Layout: One level Home Equipment: Environmental consultant - 2 wheels      Prior Function Level of Independence: Needs assistance  Gait / Transfers Assistance Needed: uses RW       PT Goals (current goals can now be found in the care plan section) Acute Rehab PT Goals Patient Stated Goal: home per husband Progress towards PT goals: Progressing toward goals    Frequency    7X/week      PT Plan Current plan remains appropriate    Co-evaluation              AM-PAC PT "6 Clicks" Daily Activity  Outcome Measure  Difficulty turning over in bed (including adjusting bedclothes, sheets and blankets)?: Unable Difficulty moving from lying on back to sitting on the side of the bed? : Unable Difficulty sitting down on and standing up from a chair with arms (e.g., wheelchair, bedside commode, etc,.)?: Unable Help needed moving to and from a bed to chair (including a wheelchair)?: Total Help needed walking in hospital room?: Total Help needed climbing 3-5 steps with a railing? : Total 6 Click Score: 6    End of Session Equipment Utilized During Treatment: Gait belt Activity Tolerance: (limited by cognition) Patient left: in bed;with call bell/phone within reach;with bed alarm set;with family/visitor present Nurse Communication: (spouse would like to talk to a Case Manager about SNF) PT Visit Diagnosis: Other abnormalities of gait and mobility  (R26.89);History of falling (Z91.81);Difficulty in walking, not elsewhere classified (R26.2);Pain;Muscle weakness (generalized) (M62.81) Pain - Right/Left: Left Pain - part of body: Knee     Time: 1410-1435 PT Time Calculation (min) (ACUTE ONLY): 25 min  Charges:  $Gait Training: 8-22 mins $Therapeutic Activity: 8-22 mins                    G Codes:       {Lorita Forinash  PTA [facility]  Acute  Rehab Pager      [phone]

## 2018-01-07 NOTE — Evaluation (Signed)
Occupational Therapy Evaluation Patient Details Name: Kim Hernandez MRN: [id number] DOB: 05/20/1941 Today's Date: 01/07/2018    History of Present Illness 77 yo female s/p L TKA 01/05/18. Hx of Alz dementia, hypotension, ETOH abuse, osteoporosis, osteopenia, falls   Clinical Impression   Pt is s/p TKA resulting in the deficits listed below (see OT Problem List).  Pt will benefit from skilled OT to increase their safety and independence with ADL and functional mobility for ADL to facilitate discharge to venue listed below.        Follow Up Recommendations  SNF    Equipment Recommendations  None recommended by OT    Recommendations for Other Services       Precautions / Restrictions Precautions Precautions: Fall Required Braces or Orthoses: Knee Immobilizer - Left Knee Immobilizer - Left: Discontinue once straight leg raise with < 10 degree lag Restrictions Weight Bearing Restrictions: No Other Position/Activity Restrictions: WBAT      Mobility Bed Mobility               General bed mobility comments: pt sitting EOB  Transfers Overall transfer level: Needs assistance Equipment used: Rolling walker (2 wheeled) Transfers: Sit to/from Stand Sit to Stand: Mod assist;+2 physical assistance;+2 safety/equipment         General transfer comment: Assist to rise, stabilize, control descent. Multimodal cueing for safety, technique, hand placement.     Balance Overall balance assessment: Needs assistance Sitting-balance support: Bilateral upper extremity supported Sitting balance-Leahy Scale: Poor     Standing balance support: Bilateral upper extremity supported Standing balance-Leahy Scale: Zero                             ADL either performed or assessed with clinical judgement   ADL Overall ADL's : Needs assistance/impaired Eating/Feeding: Minimal assistance;Sitting   Grooming: Minimal assistance;Sitting                   Toilet  Transfer: Total assistance;+2 for physical assistance;+2 for safety/equipment;RW;Stand-pivot Toilet Transfer Details (indicate cue type and reason): bed to chair           General ADL Comments: pt with posterior lean sitting EOB.  Pt complained of pain in L knee with standing. Pt total A for transfer (2 person A)     Vision Patient Visual Report: No change from baseline              Pertinent Vitals/Pain Pain Assessment: Faces Faces Pain Scale: Hurts whole lot Pain Location: L knee Pain Descriptors / Indicators: Grimacing Pain Intervention(s): Limited activity within patient's tolerance;Monitored during session;Repositioned;Premedicated before session     Hand Dominance     Extremity/Trunk Assessment Upper Extremity Assessment Upper Extremity Assessment: Generalized weakness           Communication Communication Communication: No difficulties   Cognition Arousal/Alertness: Awake/alert Behavior During Therapy: WFL for tasks assessed/performed Overall Cognitive Status: History of cognitive impairments - at baseline                                                Home Living Family/patient expects to be discharged to:: Private residence Living Arrangements: Spouse/significant other Available Help at Discharge: Family;Available 24 hours/day Type of Home: House Home Access: Stairs to enter [employer] of Steps: 4 (front).  2 steps with  no rail in back Entrance Stairs-Rails: Right Home Layout: One level               Home Equipment: Walker - 2 wheels          Prior Functioning/Environment Level of Independence: Needs assistance  Gait / Transfers Assistance Needed: uses RW              OT Problem List: Decreased strength;Decreased activity tolerance;Pain;Decreased safety awareness;Impaired balance (sitting and/or standing);Decreased knowledge of precautions;Decreased knowledge of use of DME or AE      OT  Treatment/Interventions: Self-care/ADL training;Patient/family education;DME and/or AE instruction;Therapeutic activities    OT Goals(Current goals can be found in the care plan section) Acute Rehab OT Goals Patient Stated Goal: home per husband OT Goal Formulation: With patient Time For Goal Achievement: 01/14/18 Potential to Achieve Goals: Fair  OT Frequency: Min 2X/week   Barriers to D/C:               AM-PAC PT "6 Clicks" Daily Activity     Outcome Measure Help from another person eating meals?: A Little Help from another person taking care of personal grooming?: A Little Help from another person toileting, which includes using toliet, bedpan, or urinal?: Total Help from another person bathing (including washing, rinsing, drying)?: A Lot Help from another person to put on and taking off regular upper body clothing?: A Little Help from another person to put on and taking off regular lower body clothing?: Total 6 Click Score: 13   End of Session Equipment Utilized During Treatment: Rolling walker Nurse Communication: Mobility status  Activity Tolerance: Patient limited by pain Patient left: in chair;with call bell/phone within reach;with chair alarm set  OT Visit Diagnosis: Unsteadiness on feet (R26.81);Pain;Other abnormalities of gait and mobility (R26.89);History of falling (Z91.81);Muscle weakness (generalized) (M62.81) Pain - Right/Left: Left Pain - part of body: Knee                Time: 0454-0981 OT Time Calculation (min): 17 min Charges:  OT General Charges $OT Visit: 1 Visit OT Evaluation $OT Eval Moderate Complexity: 1 Mod G-Codes:     Lise Auer, OT [phone]  Einar Crow D 01/07/2018, 12:24 PM

## 2018-01-08 LAB — CBC
HEMATOCRIT: 30.8 % — AB (ref 36.0–46.0)
HEMOGLOBIN: 10.1 g/dL — AB (ref 12.0–15.0)
MCH: 31.5 pg (ref 26.0–34.0)
MCHC: 32.8 g/dL (ref 30.0–36.0)
MCV: 96 fL (ref 78.0–100.0)
Platelets: 204 10*3/uL (ref 150–400)
RBC: 3.21 MIL/uL — AB (ref 3.87–5.11)
RDW: 12.4 % (ref 11.5–15.5)
WBC: 10.7 10*3/uL — ABNORMAL HIGH (ref 4.0–10.5)

## 2018-01-08 NOTE — NC FL2 (Addendum)
[place] MEDICAID FL2 LEVEL OF CARE SCREENING TOOL     IDENTIFICATION  Patient Name: Kim Hernandez Birthdate: 02/16/1941 Sex: female Admission Date (Current Location): 01/05/2018  [facility] and [place] Number:  Producer, television/film/video and Address:  [facility],  [address] [place] [address]      Provider Number: [id number]  Attending Physician Name and Address:  Ollen Gross, MD  Relative Name and Phone Number:       Current Level of Care: Hospital Recommended Level of Care: Skilled Nursing Facility Prior Approval Number:    Date Approved/Denied:   PASRR Number: [id number]  Discharge Plan: SNF    Current Diagnoses: Patient Active Problem List   Diagnosis Date Noted  . OA (osteoarthritis) of knee 01/05/2018  . Vasovagal syncope 06/26/2017  . Dehydration, moderate 06/26/2017  . Urinary incontinence due to cognitive impairment 06/26/2017  . History of dehydration 03/26/2017  . Arterial hypotension 03/26/2017  . Syncope and collapse 03/26/2017  . Gait instability 11/13/2016  . Hyperlipidemia 04/17/2011  . Major depression in remission (HCC) 05/23/2010  . Late onset Alzheimer's disease with behavioral disturbance 05/23/2010    Orientation RESPIRATION BLADDER Height & Weight     Self(Memory Impairment)  Normal Continent Weight: 131 lb (59.4 kg) Height:   (154.9 cm)  BEHAVIORAL SYMPTOMS/MOOD NEUROLOGICAL BOWEL NUTRITION STATUS      Continent Diet(See dc summary )  AMBULATORY STATUS COMMUNICATION OF NEEDS Skin   Extensive Assist Verbally Surgical wounds(Left Knee)                       Personal Care Assistance Level of Assistance  Bathing, Feeding, Dressing Bathing Assistance: Limited assistance Feeding assistance: Independent Dressing Assistance: Limited assistance     Functional Limitations Info  Sight, Hearing, Speech Sight Info: Adequate Hearing Info: Adequate Speech Info: Adequate    SPECIAL CARE FACTORS FREQUENCY  PT  (By licensed PT), OT (By licensed OT)     PT Frequency: 5x/week OT Frequency: 5x/week            Contractures Contractures Info: Not present    Additional Factors Info  Code Status, Allergies, Psychotropic Code Status Info: Fullcode Allergies Info: Allergies: Other           Current Medications (01/08/2018):  This is the current hospital active medication list Current Facility-Administered Medications  Medication Dose Route Frequency Provider Last Rate Last Dose  . 0.9 %  sodium chloride infusion   Intravenous Continuous Ollen Gross, MD   Stopped at 01/06/18 1200  . acetaminophen (TYLENOL) tablet 325-650 mg  325-650 mg Oral Q6H PRN Ollen Gross, MD   650 mg at 01/08/18 0956  . aspirin EC tablet 325 mg  325 mg Oral BID Ollen Gross, MD   325 mg at 01/08/18 0957  . atorvastatin (LIPITOR) tablet 40 mg  40 mg Oral q1800 Ollen Gross, MD   40 mg at 01/07/18 1807  . bisacodyl (DULCOLAX) suppository 10 mg  10 mg Rectal Daily PRN Aluisio, Homero Fellers, MD      . citalopram (CELEXA) tablet 20 mg  20 mg Oral QPM Ollen Gross, MD   20 mg at 01/07/18 1807  . diphenhydrAMINE (BENADRYL) 12.5 MG/5ML elixir 12.5-25 mg  12.5-25 mg Oral Q4H PRN Ollen Gross, MD   25 mg at 01/06/18 2055  . docusate sodium (COLACE) capsule 100 mg  100 mg Oral BID Ollen Gross, MD   100 mg at 01/08/18 0956  . donepezil (ARICEPT) tablet 10  mg  10 mg Oral QPM Ollen Gross, MD   10 mg at 01/07/18 1807  . fludrocortisone (FLORINEF) tablet 0.1 mg  0.1 mg Oral QPM Aluisio, Homero Fellers, MD   0.1 mg at 01/07/18 1807  . HYDROmorphone (DILAUDID) injection 0.5-1 mg  0.5-1 mg Intravenous Q4H PRN Aluisio, Homero Fellers, MD      . memantine (NAMENDA XR) 24 hr capsule 28 mg  28 mg Oral QPM Ollen Gross, MD   28 mg at 01/07/18 1807  . menthol-cetylpyridinium (CEPACOL) lozenge 3 mg  1 lozenge Oral PRN Ollen Gross, MD       Or  . phenol (CHLORASEPTIC) mouth spray 1 spray  1 spray Mouth/Throat PRN Aluisio, Homero Fellers, MD      .  methocarbamol (ROBAXIN) tablet 500 mg  500 mg Oral Q6H PRN Ollen Gross, MD   500 mg at 01/06/18 2055   Or  . methocarbamol (ROBAXIN) 500 mg in dextrose 5 % 50 mL IVPB  500 mg Intravenous Q6H PRN Aluisio, Homero Fellers, MD      . metoCLOPramide (REGLAN) tablet 5-10 mg  5-10 mg Oral Q8H PRN Ollen Gross, MD       Or  . metoCLOPramide (REGLAN) injection 5-10 mg  5-10 mg Intravenous Q8H PRN Aluisio, Homero Fellers, MD      . ondansetron (ZOFRAN) tablet 4 mg  4 mg Oral Q6H PRN Ollen Gross, MD       Or  . ondansetron (ZOFRAN) injection 4 mg  4 mg Intravenous Q6H PRN Aluisio, Homero Fellers, MD      . polyethylene glycol (MIRALAX / GLYCOLAX) packet 17 g  17 g Oral Daily PRN Ollen Gross, MD   17 g at 01/08/18 0955  . potassium chloride SA (K-DUR,KLOR-CON) CR tablet 40 mEq  40 mEq Oral BID Dimitri Ped, PA-C   40 mEq at 01/08/18 0957  . sodium phosphate (FLEET) 7-19 GM/118ML enema 1 enema  1 enema Rectal Once PRN Aluisio, Homero Fellers, MD      . traMADol Janean Sark) tablet 50-100 mg  50-100 mg Oral Q6H PRN Ollen Gross, MD   50 mg at 01/08/18 0445     Discharge Medications: Please see discharge summary for a list of discharge medications.  Relevant Imaging Results:  Relevant Lab Results:   Additional Information ssn:[phone]  Clearance Coots, LCSW

## 2018-01-08 NOTE — Clinical Social Work Note (Signed)
Clinical Social Work Assessment  Patient Details  Name: Kim Hernandez MRN: [id number] Date of Birth: [birth year]  Date of referral:  01/08/18               Reason for consult:  Facility Placement, Discharge Planning                Permission sought to share information with:  Family Supports Permission granted to share information::  Yes, Verbal Permission Granted  Name::        Agency::     Relationship::     Contact Information:     Housing/Transportation Living arrangements for the past 2 months:  [place], [place] of Information:  Patient Patient Interpreter Needed:  None Criminal Activity/Legal Involvement Pertinent to Current Situation/Hospitalization:  No - Comment as needed Significant Relationships:  Spouse Lives with:  Spouse Do you feel safe going back to the place where you live?  Yes Need for family participation in patient care:  Yes (Patient has dementia)  Care giving concerns:   Prior discharge plan change from [facility] to SNF. Patient progressing slowly.    Social Worker assessment / plan:  CSW met with patient spouse outside of the patient room. Patient spouse reports the patient has dementia and may stress over having to go to SNF. CSW sat down with spouse and informed him of the SNF process and waiting for insurance authorization. CSW explain the patient length of stay at the facility is determine by patient ability to progress with therapy and insurance coverage. Patient spouse reported understanding. Patient spouse became very tearful about the patient having to go to a SNF instead of home. He explain when they scheduled the surgery, he thought the patient would do so well with physical therapy in the hosptial she would transition back home with a walker and help from home care services. He is unable to care for patient at home alone due to her needing additional assistance at this time.   Spouse reports he concern about the  patient dementia progressing since admitting to the hospital and spoke to her neurologist for a follow up appointment.   Spouse reports concerns about patient mode of transportation to the facility. He states the patient will be terrified in an ambulance and has agreed to take the patient in the car. CSW and spouse confirm with Nurse the car is a safe transport.   CSW initiated authorization at  8:30am.  Patient spouse provided bed offers, chose [facility]  Plan: SNF at discharge.   Employment status:  Retired Nurse, adult PT Recommendations:  [facility] / Referral to community resources:  [facility]  Patient/Family's Response to care:  Agreeable and Responding well to care.   Patient/Family's Understanding of and Emotional Response to Diagnosis, Current Treatment, and Prognosis: Patient spouse tearful and anxious about the patient going to SNF. He is a basic understanding of patient care.   Emotional Assessment Appearance:  Appears stated age Attitude/Demeanor/Rapport:    Affect (typically observed):  Accepting Orientation:  Oriented to Self(Memory Impairment ) Alcohol / Substance use:  Not Applicable Psych involvement (Current and /or in the community):  No  Discharge Needs  Concerns to be addressed:  Discharge Planning Concerns Readmission within the last 30 days:  No Current discharge risk:  Dependent with Mobility Barriers to Discharge:  Continued Medical Work up   [employer], LCSW 01/08/2018, 1:30 PM

## 2018-01-08 NOTE — Progress Notes (Signed)
Physical Therapy Treatment Patient Details Name: NOVAH NESSEL MRN: [id number] DOB: May 06, 1941 Today's Date: 01/08/2018    History of Present Illness 77 yo female s/p L TKA 01/05/18. Hx of Alz dementia, hypotension, ETOH abuse, osteoporosis, osteopenia, falls    PT Comments    POD # 3 pm session Assisted back to bed required + 2 assist.  General transfer comment: attempted sit to stand from recliner + 2 side by side, howeever pt was resistant with increased agitation/confusion.  Performed "Bear Hug" stand pivot 1/4 from recliner to bed.  Positioned to comfort.    Follow Up Recommendations   SNF     Equipment Recommendations       Recommendations for Other Services       Precautions / Restrictions Precautions Precautions: Fall Precaution Comments: performed better without KI (unable to tolerate) Restrictions Weight Bearing Restrictions: No Other Position/Activity Restrictions: WBAT    Mobility  Bed Mobility Overal bed mobility: Needs Assistance Bed Mobility: Sit to Supine     Supine to sit: Max assist;Total assist Sit to supine: Total assist;+2 for physical assistance;+2 for safety/equipment   General bed mobility comments: assisted back to bed and positioned to comfort  Transfers Overall transfer level: Needs assistance Equipment used: Rolling walker (2 wheeled) Transfers: Sit to/from [employer] Sit to Stand: Max assist;+2 physical assistance;+2 safety/equipment;Total assist Stand pivot transfers: Total assist;+2 physical assistance;+2 safety/equipment       General transfer comment: attempted sit to stand from recliner + 2 side by side, howeever pt was resistant with increased agitation/confusion.  Performed "Bear Hug" stand pivot 1/4 from recliner to bed.    Ambulation/Gait Ambulation/Gait assistance: Max assist;Total assist;+2 physical assistance;+2 safety/equipment Ambulation Distance (Feet): 1 Feet Assistive device: Rolling walker (2  wheeled) Gait Pattern/deviations: Step-to pattern;Decreased step length - right;Decreased step length - left Gait velocity: decreased   General Gait Details: Total Assist to progress gait with severe posterior pushing and extensor tone throughout.  Cognitive impaired, fear and inability to process commands.  + 2 side by side with 3rd assist NT followed with recliner.   Stairs             Wheelchair Mobility    Modified Rankin (Stroke Patients Only)       Balance                                            Cognition Arousal/Alertness: Awake/alert                                     General Comments: required repeat one word functional/visual commands    Hx Alzheimers with poor short term memeory      Exercises      General Comments        Pertinent Vitals/Pain Pain Assessment: Faces Faces Pain Scale: Hurts whole lot Pain Location: L knee Pain Descriptors / Indicators: Grimacing;Moaning Pain Intervention(s): Monitored during session;Repositioned;Ice applied;Premedicated before session    Home Living                      Prior Function            PT Goals (current goals can now be found in the care plan section) Progress towards PT goals: Progressing toward goals    Frequency  PT Plan      Co-evaluation              AM-PAC PT "6 Clicks" Daily Activity  Outcome Measure                   End of Session Equipment Utilized During Treatment: Gait belt Activity Tolerance: (limited due to cognition) Patient left: in chair;with call bell/phone within reach;with chair alarm set;with family/visitor present         Time: 8469-6295 PT Time Calculation (min) (ACUTE ONLY): 12 min  Charges:  $Therapeutic Activity: 8-22 mins                    G Codes:       Felecia Shelling  PTA [facility]  Acute  Rehab Pager      [phone]

## 2018-01-08 NOTE — Discharge Summary (Addendum)
Physician Discharge Summary   Patient ID: Kim Hernandez MRN: [id number] DOB/AGE: [birth year] 77 y.o.  Admit date: 01/05/2018 Discharge date: 01/09/2018  Primary Diagnosis: Primary osteoarthritis left knee   Admission Diagnoses:  Past Medical History:  Diagnosis Date  . Alcohol abuse   . Alzheimer's disease 05/23/2010  . ANXIETY DEPRESSION 05/23/2010  . Arterial hypotension   . Arthritis   . DEMENTIA 07/20/2008  . Depression    monopolar  . Gait instability   . GERD 05/11/2007   ?  Marland Kitchen History of dehydration   . History of kidney stones   . Hyperlipidemia   . Memory loss   . Microscopic colitis   . Migraine    pt denies  . Multiple falls   . OSTEOARTHRITIS, KNEE 05/23/2010  . Osteopenia   . OSTEOPOROSIS 12/17/2007  . Urinary incontinence due to cognitive impairment   . Vasovagal syncope 06/26/2017  . Wrist pain, right 04/17/2011   Discharge Diagnoses:   Principal Problem:   OA (osteoarthritis) of knee  Estimated body mass index is 24.75 kg/m as calculated from the following:   Height as of this encounter: 5' 1" (1.549 m).   Weight as of this encounter: 59.4 kg (131 lb).  Procedure:  Procedure(s) (LRB): LEFT TOTAL KNEE ARTHROPLASTY (Left)   Consults: None  HPI: Kim Hernandez, 77 y.o. female, has a history of pain and functional disability in the left knee due to arthritis and has failed non-surgical conservative treatments for greater than 12 weeks to includeNSAID's and/or analgesics, corticosteriod injections, flexibility and strengthening excercises, use of assistive devices and activity modification.  Onset of symptoms was gradual, starting 5 years ago with gradually worsening course since that time. The patient noted no past surgery on the left knee(s).  Patient currently rates pain in the left knee(s) at 8 out of 10 with activity. Patient has night pain, worsening of pain with activity and weight bearing, pain that interferes with activities of daily living, pain  with passive range of motion, crepitus and joint swelling.  Patient has evidence of subchondral cysts, periarticular osteophytes and joint space narrowing by imaging studies.There is no active infection.    Laboratory Data: Admission on 01/05/2018  Component Date Value Ref Range Status  . WBC 01/06/2018 12.6* 4.0 - 10.5 K/uL Final  . RBC 01/06/2018 3.09* 3.87 - 5.11 MIL/uL Final  . Hemoglobin 01/06/2018 9.9* 12.0 - 15.0 g/dL Final  . HCT 01/06/2018 30.1* 36.0 - 46.0 % Final  . MCV 01/06/2018 97.4  78.0 - 100.0 fL Final  . MCH 01/06/2018 32.0  26.0 - 34.0 pg Final  . MCHC 01/06/2018 32.9  30.0 - 36.0 g/dL Final  . RDW 01/06/2018 11.8  11.5 - 15.5 % Final  . Platelets 01/06/2018 188  150 - 400 K/uL Final   Performed at [facility], [place] [address], [place], [place] [address]  . Sodium 01/06/2018 139  135 - 145 mmol/L Final  . Potassium 01/06/2018 3.1* 3.5 - 5.1 mmol/L Final  . Chloride 01/06/2018 105  101 - 111 mmol/L Final  . CO2 01/06/2018 25  22 - 32 mmol/L Final  . Glucose, Bld 01/06/2018 122* 65 - 99 mg/dL Final  . BUN 01/06/2018 15  6 - 20 mg/dL Final  . Creatinine, Ser 01/06/2018 0.63  0.44 - 1.00 mg/dL Final  . Calcium 01/06/2018 8.6* 8.9 - 10.3 mg/dL Final  . GFR calc non Af Amer 01/06/2018 >60  >60 mL/min Final  . GFR calc Af Wyvonnia Lora  01/06/2018 >60  >60 mL/min Final   Comment: (NOTE) The eGFR has been calculated using the CKD EPI equation. This calculation has not been validated in all clinical situations. eGFR's persistently <60 mL/min signify possible Chronic Kidney Disease.   Georgiann Hahn gap 01/06/2018 9  5 - 15 Final   Performed at [facility], [place] [address] [place], [place] [address]  . WBC 01/07/2018 13.3* 4.0 - 10.5 K/uL Final  . RBC 01/07/2018 3.42* 3.87 - 5.11 MIL/uL Final  . Hemoglobin 01/07/2018 10.8* 12.0 - 15.0 g/dL Final  . HCT 01/07/2018 33.0* 36.0 - 46.0 % Final  . MCV 01/07/2018 96.5  78.0 - 100.0 fL Final  . MCH  01/07/2018 31.6  26.0 - 34.0 pg Final  . MCHC 01/07/2018 32.7  30.0 - 36.0 g/dL Final  . RDW 01/07/2018 12.2  11.5 - 15.5 % Final  . Platelets 01/07/2018 198  150 - 400 K/uL Final   Performed at [facility], [place] [address], [place], [place] [address]  . Sodium 01/07/2018 140  135 - 145 mmol/L Final  . Potassium 01/07/2018 3.3* 3.5 - 5.1 mmol/L Final  . Chloride 01/07/2018 106  101 - 111 mmol/L Final  . CO2 01/07/2018 24  22 - 32 mmol/L Final  . Glucose, Bld 01/07/2018 104* 65 - 99 mg/dL Final  . BUN 01/07/2018 8  6 - 20 mg/dL Final  . Creatinine, Ser 01/07/2018 0.55  0.44 - 1.00 mg/dL Final  . Calcium 01/07/2018 8.4* 8.9 - 10.3 mg/dL Final  . GFR calc non Af Amer 01/07/2018 >60  >60 mL/min Final  . GFR calc Af Amer 01/07/2018 >60  >60 mL/min Final   Comment: (NOTE) The eGFR has been calculated using the CKD EPI equation. This calculation has not been validated in all clinical situations. eGFR's persistently <60 mL/min signify possible Chronic Kidney Disease.   Georgiann Hahn gap 01/07/2018 10  5 - 15 Final   Performed at [facility], [place] [address] [place], [place] [address]  . WBC 01/08/2018 10.7* 4.0 - 10.5 K/uL Final  . RBC 01/08/2018 3.21* 3.87 - 5.11 MIL/uL Final  . Hemoglobin 01/08/2018 10.1* 12.0 - 15.0 g/dL Final  . HCT 01/08/2018 30.8* 36.0 - 46.0 % Final  . MCV 01/08/2018 96.0  78.0 - 100.0 fL Final  . MCH 01/08/2018 31.5  26.0 - 34.0 pg Final  . MCHC 01/08/2018 32.8  30.0 - 36.0 g/dL Final  . RDW 01/08/2018 12.4  11.5 - 15.5 % Final  . Platelets 01/08/2018 204  150 - 400 K/uL Final   Performed at [facility], [place] [address] [place], [place] [address]  Hospital Outpatient Visit on 01/02/2018  Component Date Value Ref Range Status  . aPTT 01/02/2018 37* 24 - 36 seconds Final   Comment:        IF BASELINE aPTT IS ELEVATED, SUGGEST PATIENT RISK ASSESSMENT BE USED TO DETERMINE APPROPRIATE ANTICOAGULANT  THERAPY. Performed at [facility], [place] [address] [place],  [address]   . WBC 01/02/2018 7.1  4.0 - 10.5 K/uL Final  . RBC 01/02/2018 4.01  3.87 - 5.11 MIL/uL Final  . Hemoglobin 01/02/2018 12.9  12.0 - 15.0 g/dL Final  . HCT 01/02/2018 39.1  36.0 - 46.0 % Final  . MCV 01/02/2018 97.5  78.0 - 100.0 fL Final  . MCH 01/02/2018 32.2  26.0 - 34.0 pg Final  . MCHC 01/02/2018 33.0  30.0 - 36.0 g/dL Final  .  RDW 01/02/2018 11.9  11.5 - 15.5 % Final  . Platelets 01/02/2018 244  150 - 400 K/uL Final   Performed at [facility], [place] [address] [place], [place] [address]  . Sodium 01/02/2018 138  135 - 145 mmol/L Final  . Potassium 01/02/2018 3.3* 3.5 - 5.1 mmol/L Final  . Chloride 01/02/2018 101  101 - 111 mmol/L Final  . CO2 01/02/2018 27  22 - 32 mmol/L Final  . Glucose, Bld 01/02/2018 102* 65 - 99 mg/dL Final  . BUN 01/02/2018 17  6 - 20 mg/dL Final  . Creatinine, Ser 01/02/2018 0.73  0.44 - 1.00 mg/dL Final  . Calcium 01/02/2018 8.6* 8.9 - 10.3 mg/dL Final  . Total Protein 01/02/2018 6.6  6.5 - 8.1 g/dL Final  . Albumin 01/02/2018 3.6  3.5 - 5.0 g/dL Final  . AST 01/02/2018 23  15 - 41 U/L Final  . ALT 01/02/2018 19  14 - 54 U/L Final  . Alkaline Phosphatase 01/02/2018 141* 38 - 126 U/L Final  . Total Bilirubin 01/02/2018 0.4  0.3 - 1.2 mg/dL Final  . GFR calc non Af Amer 01/02/2018 >60  >60 mL/min Final  . GFR calc Af Amer 01/02/2018 >60  >60 mL/min Final   Comment: (NOTE) The eGFR has been calculated using the CKD EPI equation. This calculation has not been validated in all clinical situations. eGFR's persistently <60 mL/min signify possible Chronic Kidney Disease.   Georgiann Hahn gap 01/02/2018 10  5 - 15 Final   Performed at [facility], [place] [address], [place], [place] [address]  . Prothrombin Time 01/02/2018 12.7  11.4 - 15.2 seconds Final  . INR 01/02/2018 0.96   Final   Performed at [facility],  [place] [address] [place], [place] [address]  . MRSA, PCR 01/02/2018 NEGATIVE  NEGATIVE Final  . Staphylococcus aureus 01/02/2018 NEGATIVE  NEGATIVE Final   Comment: (NOTE) The Xpert SA Assay (FDA approved for NASAL specimens in patients 74 years of age and older), is one component of a comprehensive surveillance program. It is not intended to diagnose infection nor to guide or monitor treatment. Performed at [facility], [place] [address], [place], [place] [address]   . Transfuse no blood products 01/02/2018    Final                   Value:TRANSFUSE NO BLOOD PRODUCTS, VERIFIED BY Janyth Pupa, RN Performed at [facility], [place] [address] [place], [place] [address]      Hospital Course: NAZARENE BUNNING is a 77 y.o. who was admitted to [facility]. They were brought to the operating room on 01/05/2018 and underwent Procedure(s): LEFT TOTAL KNEE ARTHROPLASTY.  Patient tolerated the procedure well and was later transferred to the recovery room and then to the orthopaedic floor for postoperative care.  They were given PO and IV analgesics for pain control following their surgery.  They were given 24 hours of postoperative antibiotics of  Anti-infectives (From admission, onward)   Start     Dose/Rate Route Frequency Ordered Stop   01/05/18 1630  ceFAZolin (ANCEF) IVPB 1 g/50 mL premix     1 g 100 mL/hr over 30 Minutes Intravenous Every 6 hours 01/05/18 1257 01/05/18 2200   01/05/18 0800  ceFAZolin (ANCEF) IVPB 2g/100 mL premix     2 g 200 mL/hr over 30 Minutes Intravenous On call to O.R. 01/05/18 4174 01/05/18 1057  and started on DVT prophylaxis in the form of Aspirin.   PT and OT were ordered for total joint protocol.  Discharge planning consulted to help with postop disposition and equipment needs.  Patient had a fair night on the evening of surgery.  They started to get up OOB with therapy on day one but progressed slowly due to dementia  and difficulty following cues. Hemovac drain was pulled without difficulty.  Continued to work with therapy into day two.  Dressing was changed on day two and the incision was clean and dry. She did continue to have some issues with progression because of dementia. Decision was made to adjust DC plan to SNF placement. Incision was healing well.  Patient was seen in rounds and was ready to go to SNF once bed available.   Diet: Cardiac diet Activity:WBAT Follow-up:in 2 weeks Disposition - Skilled nursing facility Discharged Condition: stable   Discharge Instructions    Call MD / Call 911   Complete by:  As directed    If you experience chest pain or shortness of breath, CALL 911 and be transported to the hospital emergency room.  If you develope a fever above 101 F, pus (white drainage) or increased drainage or redness at the wound, or calf pain, call your surgeon's office.   Constipation Prevention   Complete by:  As directed    Drink plenty of fluids.  Prune juice may be helpful.  You may use a stool softener, such as Colace (over the counter) 100 mg twice a day.  Use MiraLax (over the counter) for constipation as needed.   Diet - low sodium heart healthy   Complete by:  As directed    Discharge instructions   Complete by:  As directed    Dr. Gaynelle Arabian Total Joint Specialist [facility] [address], [place], [place] [address] [phone]  TOTAL KNEE REPLACEMENT POSTOPERATIVE DIRECTIONS  Knee Rehabilitation, Guidelines Following Surgery  Results after knee surgery are often greatly improved when you follow the exercise, range of motion and muscle strengthening exercises prescribed by your doctor. Safety measures are also important to protect the knee from further injury. Any time any of these exercises cause you to have increased pain or swelling in your knee joint, decrease the amount until you are comfortable again and slowly increase them. If you have problems or  questions, call your caregiver or physical therapist for advice.   HOME CARE INSTRUCTIONS  Remove items at home which could result in a fall. This includes throw rugs or furniture in walking pathways.  ICE to the affected knee every three hours for 30 minutes at a time and then as needed for pain and swelling.  Continue to use ice on the knee for pain and swelling from surgery. You may notice swelling that will progress down to the foot and ankle.  This is normal after surgery.  Elevate the leg when you are not up walking on it.   Continue to use the breathing machine which will help keep your temperature down.  It is common for your temperature to cycle up and down following surgery, especially at night when you are not up moving around and exerting yourself.  The breathing machine keeps your lungs expanded and your temperature down. Do not place pillow under knee, focus on keeping the knee straight while resting  DIET You may resume your previous home diet once your are discharged from the hospital.  DRESSING / WOUND CARE /  SHOWERING You may change your dressing every day with sterile gauze.  Please use good hand washing techniques before changing the dressing.  Do not use any lotions or creams on the incision until instructed by your surgeon. You may start showering once you are discharged home but do not submerge the incision under water. Just pat the incision dry and apply a dry gauze dressing on daily. Change the surgical dressing daily and reapply a dry dressing each time.  ACTIVITY Walk with your walker as instructed. Use walker as long as suggested by your caregivers. Avoid periods of inactivity such as sitting longer than an hour when not asleep. This helps prevent blood clots.  You may resume a sexual relationship in one month or when given the OK by your doctor.  You may return to work once you are cleared by your doctor.  Do not drive a car for 6 weeks or until released by you  surgeon.  Do not drive while taking narcotics.  WEIGHT BEARING Weight bearing as tolerated with assist device (walker, cane, etc) as directed, use it as long as suggested by your surgeon or therapist, typically at least 4-6 weeks.  POSTOPERATIVE CONSTIPATION PROTOCOL Constipation - defined medically as fewer than three stools per week and severe constipation as less than one stool per week.  One of the most common issues patients have following surgery is constipation.  Even if you have a regular bowel pattern at home, your normal regimen is likely to be disrupted due to multiple reasons following surgery.  Combination of anesthesia, postoperative narcotics, change in appetite and fluid intake all can affect your bowels.  In order to avoid complications following surgery, here are some recommendations in order to help you during your recovery period.  Colace (docusate) - Pick up an over-the-counter form of Colace or another stool softener and take twice a day as long as you are requiring postoperative pain medications.  Take with a full glass of water daily.  If you experience loose stools or diarrhea, hold the colace until you stool forms back up.  If your symptoms do not get better within 1 week or if they get worse, check with your doctor.  Dulcolax (bisacodyl) - Pick up over-the-counter and take as directed by the product packaging as needed to assist with the movement of your bowels.  Take with a full glass of water.  Use this product as needed if not relieved by Colace only.   MiraLax (polyethylene glycol) - Pick up over-the-counter to have on hand.  MiraLax is a solution that will increase the amount of water in your bowels to assist with bowel movements.  Take as directed and can mix with a glass of water, juice, soda, coffee, or tea.  Take if you go more than two days without a movement. Do not use MiraLax more than once per day. Call your doctor if you are still constipated or irregular  after using this medication for 7 days in a row.  If you continue to have problems with postoperative constipation, please contact the office for further assistance and recommendations.  If you experience "the worst abdominal pain ever" or develop nausea or vomiting, please contact the office immediatly for further recommendations for treatment.  ITCHING  If you experience itching with your medications, try taking only a single pain pill, or even half a pain pill at a time.  You can also use Benadryl over the counter for itching or also to help with sleep.  TED HOSE STOCKINGS Wear the elastic stockings on both legs for three weeks following surgery during the day but you may remove then at night for sleeping.  MEDICATIONS See your medication summary on the "After Visit Summary" that the nursing staff will review with you prior to discharge.  You may have some home medications which will be placed on hold until you complete the course of blood thinner medication.  It is important for you to complete the blood thinner medication as prescribed by your surgeon.  Continue your approved medications as instructed at time of discharge.  PRECAUTIONS If you experience chest pain or shortness of breath - call 911 immediately for transfer to the hospital emergency department.  If you develop a fever greater that 101 F, purulent drainage from wound, increased redness or drainage from wound, foul odor from the wound/dressing, or calf pain - CONTACT YOUR SURGEON.                                                   FOLLOW-UP APPOINTMENTS Make sure you keep all of your appointments after your operation with your surgeon and caregivers. You should call the office at the above phone number and make an appointment for approximately two weeks after the date of your surgery or on the date instructed by your surgeon outlined in the "After Visit Summary".   RANGE OF MOTION AND STRENGTHENING EXERCISES  Rehabilitation of  the knee is important following a knee injury or an operation. After just a few days of immobilization, the muscles of the thigh which control the knee become weakened and shrink (atrophy). Knee exercises are designed to build up the tone and strength of the thigh muscles and to improve knee motion. Often times heat used for twenty to thirty minutes before working out will loosen up your tissues and help with improving the range of motion but do not use heat for the first two weeks following surgery. These exercises can be done on a training (exercise) mat, on the floor, on a table or on a bed. Use what ever works the best and is most comfortable for you Knee exercises include:  Leg Lifts - While your knee is still immobilized in a splint or cast, you can do straight leg raises. Lift the leg to 60 degrees, hold for 3 sec, and slowly lower the leg. Repeat 10-20 times 2-3 times daily. Perform this exercise against resistance later as your knee gets better.  Quad and Hamstring Sets - Tighten up the muscle on the front of the thigh (Quad) and hold for 5-10 sec. Repeat this 10-20 times hourly. Hamstring sets are done by pushing the foot backward against an object and holding for 5-10 sec. Repeat as with quad sets.  Leg Slides: Lying on your back, slowly slide your foot toward your buttocks, bending your knee up off the floor (only go as far as is comfortable). Then slowly slide your foot back down until your leg is flat on the floor again. Angel Wings: Lying on your back spread your legs to the side as far apart as you can without causing discomfort.  A rehabilitation program following serious knee injuries can speed recovery and prevent re-injury in the future due to weakened muscles. Contact your doctor or a physical therapist for more information on knee rehabilitation.   IF YOU  ARE TRANSFERRED TO A SKILLED REHAB FACILITY If the patient is transferred to a skilled rehab facility following release from the  hospital, a list of the current medications will be sent to the facility for the patient to continue.  When discharged from the skilled rehab facility, please have the facility set up the patient's [place] prior to being released. Also, the skilled facility will be responsible for providing the patient with their medications at time of release from the facility to include their pain medication, the muscle relaxants, and their blood thinner medication. If the patient is still at the rehab facility at time of the two week follow up appointment, the skilled rehab facility will also need to assist the patient in arranging follow up appointment in our office and any transportation needs.  MAKE SURE YOU:  Understand these instructions.  Get help right away if you are not doing well or get worse.    Pick up stool softner and laxative for home use following surgery while on pain medications. Do not submerge incision under water. Please use good hand washing techniques while changing dressing each day. May shower starting three days after surgery. Please use a clean towel to pat the incision dry following showers. Continue to use ice for pain and swelling after surgery. Do not use any lotions or creams on the incision until instructed by your surgeon.   Increase activity slowly as tolerated   Complete by:  As directed    Weight bearing as tolerated   Complete by:  As directed      Allergies as of 01/08/2018      Reactions   Other    Blood product refusal      Medication List    TAKE these medications   alendronate 70 MG tablet Commonly known as:  FOSAMAX Take 70 mg by mouth every Wednesday. Take with a full glass of water on an empty stomach.   aspirin 325 MG EC tablet Take 1 tablet (325 mg total) by mouth 2 (two) times daily. Take one tablet two times a day for three weeks to prevent blood clots.   atorvastatin 40 MG tablet Commonly known as:  LIPITOR TAKE 1  TABLET(40 MG) BY MOUTH DAILY What changed:  See the new instructions.   cholecalciferol 1000 units tablet Commonly known as:  VITAMIN D Take 1,000 Units by mouth every evening.   citalopram 20 MG tablet Commonly known as:  CELEXA Take 1 tablet (20 mg total) by mouth daily. What changed:  when to take this   donepezil 10 MG tablet Commonly known as:  ARICEPT Take 1 tablet (10 mg total) by mouth at bedtime. What changed:  when to take this   fludrocortisone 0.1 MG tablet Commonly known as:  FLORINEF take 0.27m by mouth daily in the evening   memantine 28 MG Cp24 24 hr capsule Commonly known as:  NAMENDA XR TAKE 1 CAPSULE(28 MG) BY MOUTH DAILY What changed:  See the new instructions.   methocarbamol 500 MG tablet Commonly known as:  ROBAXIN Take 1 tablet (500 mg total) by mouth every 6 (six) hours as needed for muscle spasms.   ondansetron 4 MG disintegrating tablet Commonly known as:  ZOFRAN ODT Take 1 tablet (4 mg total) by mouth every 8 (eight) hours as needed for nausea or vomiting.   potassium chloride SA 20 MEQ tablet Commonly known as:  K-DUR,KLOR-CON Take 1 tablet (20 mEq total) by mouth 2 (two) times daily.  traMADol 50 MG tablet Commonly known as:  ULTRAM Take 1-2 tablets (50-100 mg total) by mouth every 6 (six) hours as needed for moderate pain.   vitamin B-12 100 MCG tablet Commonly known as:  CYANOCOBALAMIN Take 100 mcg by mouth every evening.            Discharge Care Instructions  (From admission, onward)        Start     Ordered   01/08/18 0000  Weight bearing as tolerated     01/08/18 0741     Follow-up Information    Gaynelle Arabian, MD. Schedule an appointment as soon as possible for a visit on 01/20/2018.   Specialty:  Orthopedic Surgery Contact information: [address] [place] [address] [phone]           Signed: Ardeen Jourdain, PA-C Orthopaedic Surgery 01/09/2018 [phone]

## 2018-01-08 NOTE — Progress Notes (Signed)
   Subjective: 3 Days Post-Op Procedure(s) (LRB): LEFT TOTAL KNEE ARTHROPLASTY (Left) Patient reports pain as mild.   Patient seen in rounds with Dr. Lequita Halt. Patient is well, and has had no acute complaints or problems. Reports that she has less pain in her left knee today. Recalls that she is in the hospital following surgery on her left knee.  Plan is to go Skilled nursing facility after hospital stay.  Objective: Vital signs in last 24 hours: Temp:  [98.4 F (36.9 C)-99 F (37.2 C)] 98.6 F (37 C) (05/23 0434) Pulse Rate:  [82-99] 91 (05/23 0434) Resp:  [15-17] 15 (05/23 0434) BP: (141-167)/(79-85) 141/79 (05/23 0434) SpO2:  [94 %-96 %] 95 % (05/23 0434)  Intake/Output from previous day:  Intake/Output Summary (Last 24 hours) at 01/08/2018 0738 Last data filed at 01/08/2018 0600 Gross per 24 hour  Intake 570 ml  Output 375 ml  Net 195 ml     Labs: Recent Labs    01/06/18 0540 01/07/18 0546 01/08/18 0539  HGB 9.9* 10.8* 10.1*   Recent Labs    01/07/18 0546 01/08/18 0539  WBC 13.3* 10.7*  RBC 3.42* 3.21*  HCT 33.0* 30.8*  PLT 198 204   Recent Labs    01/06/18 0540 01/07/18 0546  NA 139 140  K 3.1* 3.3*  CL 105 106  CO2 25 24  BUN 15 8  CREATININE 0.63 0.55  GLUCOSE 122* 104*  CALCIUM 8.6* 8.4*   EXAM General - Patient is Alert and Oriented in the moment but does not recall interaction shortly after Extremity - Sensation intact distally Intact pulses distally Dorsiflexion/Plantar flexion intact No cellulitis present Compartment soft Dressing/Incision - clean, dry, no drainage Motor Function - intact, moving foot and toes well on exam.     Past Medical History:  Diagnosis Date  . Alcohol abuse   . Alzheimer's disease 05/23/2010  . ANXIETY DEPRESSION 05/23/2010  . Arterial hypotension   . Arthritis   . DEMENTIA 07/20/2008  . Depression    monopolar  . Gait instability   . GERD 05/11/2007   ?  Marland Kitchen History of dehydration   . History of kidney  stones   . Hyperlipidemia   . Memory loss   . Microscopic colitis   . Migraine    pt denies  . Multiple falls   . OSTEOARTHRITIS, KNEE 05/23/2010  . Osteopenia   . OSTEOPOROSIS 12/17/2007  . Urinary incontinence due to cognitive impairment   . Vasovagal syncope 06/26/2017  . Wrist pain, right 04/17/2011    Assessment/Plan: 3 Days Post-Op Procedure(s) (LRB): LEFT TOTAL KNEE ARTHROPLASTY (Left) Principal Problem:   OA (osteoarthritis) of knee  Estimated body mass index is 24.75 kg/m as calculated from the following:   Height as of this encounter:  (1.549 m).   Weight as of this encounter: 59.4 kg (131 lb). Advance diet Up with therapy Discharge to SNF when bed available  DVT Prophylaxis - Aspirin Weight-Bearing as tolerated   Dr. Lequita Halt discussed plan of care with the patient's husband and decided to make arrangements for SNF placement. Will continue with therapy in house until bed available and she can be DC to SNF.  Dimitri Ped, PA-C Orthopaedic Surgery 01/08/2018, 7:38 AM

## 2018-01-08 NOTE — Progress Notes (Signed)
Physical Therapy Treatment Patient Details Name: Kim Hernandez MRN: [id number] DOB: [birth year] Today's Date: 01/08/2018    History of Present Illness 77 yo female s/p L TKA 01/05/18. Hx of Alz dementia, hypotension, ETOH abuse, osteoporosis, osteopenia, falls    PT Comments    POD # 3 am session Pt progressing slowly due to cognition and memory.  Assisted OOB to [facility] required + 2 assist. General transfer comment: required + 2 side by side anssist from EOB to [facility] with increased assist to complete 1/4 pivot.  Pt was unable to functionallt weight shift and unable to functionally step .  General Gait Details: Total Assist to progress gait with severe posterior pushing and extensor tone throughout.  Cognitive impaired, fear and inability to process commands.  + 2 side by side with 3rd assist NT followed with recliner. Pt will need ST Rehab at SNF prior to returning home. Spouse agrees he is not able to care at this current level.   Follow Up Recommendations        Equipment Recommendations       Recommendations for Other Services       Precautions / Restrictions Precautions Precautions: Fall Precaution Comments: performed better without KI (unable to tolerate) Restrictions Weight Bearing Restrictions: No Other Position/Activity Restrictions: WBAT    Mobility  Bed Mobility Overal bed mobility: Needs Assistance Bed Mobility: Supine to Sit     Supine to sit: Max assist;Total assist     General bed mobility comments: assisted OOB required + 2 assist and repeat functional VC's to increase self ability.  Poor initiation and follow through  Transfers Overall transfer level: Needs assistance Equipment used: Rolling walker (2 wheeled) Transfers: Sit to/from [employer] Sit to Stand: Max assist;+2 physical assistance;+2 safety/equipment;Total assist Stand pivot transfers: Total assist;+2 physical assistance;+2 safety/equipment       General transfer comment:  required + 2 side by side anssist from EOB to [facility] with increased assist to complete 1/4 pivot.  Pt was unable to functionallt weight shift and unable to functionally step .    Ambulation/Gait Ambulation/Gait assistance: Max assist;Total assist;+2 physical assistance;+2 safety/equipment Ambulation Distance (Feet): 1 Feet Assistive device: Rolling walker (2 wheeled) Gait Pattern/deviations: Step-to pattern;Decreased step length - right;Decreased step length - left Gait velocity: decreased   General Gait Details: Total Assist to progress gait with severe posterior pushing and extensor tone throughout.  Cognitive impaired, fear and inability to process commands.  + 2 side by side with 3rd assist NT followed with recliner.   Stairs             Wheelchair Mobility    Modified Rankin (Stroke Patients Only)       Balance                                            Cognition Arousal/Alertness: Awake/alert                                     General Comments: required repeat one word functional/visual commands    Hx Alzheimers with poor short term memeory      Exercises      General Comments        Pertinent Vitals/Pain Pain Assessment: Faces Faces Pain Scale: Hurts whole lot Pain Location: L knee Pain  Descriptors / Indicators: Grimacing;Moaning Pain Intervention(s): Monitored during session;Repositioned;Ice applied;Premedicated before session    Home Living                      Prior Function            PT Goals (current goals can now be found in the care plan section) Progress towards PT goals: Progressing toward goals    Frequency           PT Plan      Co-evaluation              AM-PAC PT "6 Clicks" Daily Activity  Outcome Measure                   End of Session Equipment Utilized During Treatment: Gait belt Activity Tolerance: (limited due to cognition) Patient left: in chair;with call  bell/phone within reach;with chair alarm set;with family/visitor present         Time: 1040-1105 PT Time Calculation (min) (ACUTE ONLY): 25 min  Charges:  $Gait Training: 8-22 mins $Therapeutic Activity: 8-22 mins                    G Codes:       Felecia Shelling  PTA [facility]  Acute  Rehab Pager      [phone]

## 2018-01-08 NOTE — Care Management Important Message (Signed)
Important Message  Patient Details  Name: Kim Hernandez MRN: [id number] Date of Birth: [birth year]   Medicare Important Message Given:  Yes    Caren Macadam 01/08/2018, 11:31 AMImportant Message  Patient Details  Name: Kim Hernandez MRN: [id number] Date of Birth: 05/12/1941   Medicare Important Message Given:  Yes    Caren Macadam 01/08/2018, 11:30 AM

## 2018-01-09 ENCOUNTER — Encounter: Payer: Self-pay | Admitting: Neurology

## 2018-01-09 DIAGNOSIS — E876 Hypokalemia: Secondary | ICD-10-CM | POA: Diagnosis not present

## 2018-01-09 DIAGNOSIS — Z96652 Presence of left artificial knee joint: Secondary | ICD-10-CM | POA: Diagnosis not present

## 2018-01-09 DIAGNOSIS — R2689 Other abnormalities of gait and mobility: Secondary | ICD-10-CM | POA: Diagnosis not present

## 2018-01-09 DIAGNOSIS — M6281 Muscle weakness (generalized): Secondary | ICD-10-CM | POA: Diagnosis not present

## 2018-01-09 DIAGNOSIS — R488 Other symbolic dysfunctions: Secondary | ICD-10-CM | POA: Diagnosis not present

## 2018-01-09 DIAGNOSIS — M25562 Pain in left knee: Secondary | ICD-10-CM | POA: Diagnosis not present

## 2018-01-09 DIAGNOSIS — Z471 Aftercare following joint replacement surgery: Secondary | ICD-10-CM | POA: Diagnosis not present

## 2018-01-09 DIAGNOSIS — R41841 Cognitive communication deficit: Secondary | ICD-10-CM | POA: Diagnosis not present

## 2018-01-09 DIAGNOSIS — D649 Anemia, unspecified: Secondary | ICD-10-CM | POA: Diagnosis not present

## 2018-01-09 DIAGNOSIS — M1712 Unilateral primary osteoarthritis, left knee: Secondary | ICD-10-CM | POA: Diagnosis not present

## 2018-01-09 DIAGNOSIS — F0390 Unspecified dementia without behavioral disturbance: Secondary | ICD-10-CM | POA: Diagnosis not present

## 2018-01-09 DIAGNOSIS — R278 Other lack of coordination: Secondary | ICD-10-CM | POA: Diagnosis not present

## 2018-01-09 DIAGNOSIS — F0280 Dementia in other diseases classified elsewhere without behavioral disturbance: Secondary | ICD-10-CM | POA: Diagnosis not present

## 2018-01-09 DIAGNOSIS — G309 Alzheimer's disease, unspecified: Secondary | ICD-10-CM | POA: Diagnosis not present

## 2018-01-09 NOTE — Progress Notes (Signed)
Insurance authorization received at 4:45PM -5/23, the facility unable to accept, the admitting had already left.   Authorization number [id number]. 3 days next review 25. 1.5 hours of PT./updated information sent to [phone] rug level RUC.  Patient spouse informed. Patient can discharge today.   Vivi Barrack, Theresia Majors, MSW Clinical Social Worker  [phone] 01/09/2018  8:49 AM

## 2018-01-09 NOTE — Progress Notes (Signed)
Occupational Therapy Treatment Patient Details Name: Kim Hernandez MRN: [id number] DOB: March 29, 1941 Today's Date: 01/09/2018    History of present illness 77 yo female s/p L TKA 01/05/18. Hx of Alz dementia, hypotension, ETOH abuse, osteoporosis, osteopenia, falls   OT comments  Pt sat EOB with OT and did have increase sitting balance  Follow Up Recommendations  SNF    Equipment Recommendations  None recommended by OT    Recommendations for Other Services      Precautions / Restrictions Precautions Precautions: Fall Restrictions Weight Bearing Restrictions: No Other Position/Activity Restrictions: WBAT       Mobility Bed Mobility Overal bed mobility: Needs Assistance Bed Mobility: Supine to Sit     Supine to sit: Max assist        Transfers     Transfers: Sit to/from [employer] Sit to Stand: Max assist;+2 physical assistance;+2 safety/equipment;Total assist Stand pivot transfers: Total assist;+2 physical assistance;+2 safety/equipment;Max assist       General transfer comment: transfer performed with OT on one side and nurse on other side.     Balance Overall balance assessment: Needs assistance Sitting-balance support: Bilateral upper extremity supported Sitting balance-Leahy Scale: Fair     Standing balance support: Bilateral upper extremity supported Standing balance-Leahy Scale: Zero                             ADL either performed or assessed with clinical judgement   ADL Overall ADL's : Needs assistance/impaired                         Toilet Transfer: Total assistance;+2 for physical assistance;+2 for safety/equipment;RW;Stand-pivot   Toileting- Clothing Manipulation and Hygiene: +2 for physical assistance;+2 for safety/equipment;Sit to/from stand;Total assistance         General ADL Comments: pt tolerated sitting EOB with limited posterior lean.  Pt sat for 10 min with min A.  Pt agreed to get to  chair but needed total A.  Pts husband agreeable to SNF               Cognition Arousal/Alertness: Awake/alert Behavior During Therapy: WFL for tasks assessed/performed                                   General Comments: required repeat one word functional/visual commands    Hx Alzheimers with poor short term memeory                   Pertinent Vitals/ Pain       Faces Pain Scale: Hurts whole lot Pain Location: L knee with transfer to chair. Did not grimace in sitting         Frequency  Min 2X/week        Progress Toward Goals  OT Goals(current goals can now be found in the care plan section)  Progress towards OT goals: Progressing toward goals      AM-PAC PT "6 Clicks" Daily Activity     Outcome Measure   Help from another person eating meals?: A Little Help from another person taking care of personal grooming?: A Little Help from another person toileting, which includes using toliet, bedpan, or urinal?: Total Help from another person bathing (including washing, rinsing, drying)?: A Lot Help from another person to put on and taking off regular upper body clothing?: A  Little Help from another person to put on and taking off regular lower body clothing?: Total 6 Click Score: 13    End of Session Equipment Utilized During Treatment: Rolling walker  OT Visit Diagnosis: Unsteadiness on feet (R26.81);Pain;Other abnormalities of gait and mobility (R26.89);History of falling (Z91.81);Muscle weakness (generalized) (M62.81) Pain - Right/Left: Left Pain - part of body: Knee   Activity Tolerance Patient limited by pain   Patient Left in chair;with call bell/phone within reach;with chair alarm set   Nurse Communication Mobility status        Time: 1610-9604 OT Time Calculation (min): 27 min  Charges: OT General Charges $OT Visit: 1 Visit OT Treatments $Self Care/Home Management : 23-37 mins  [place], [place] [phone]   Einar Crow D 01/09/2018, 10:28 AM

## 2018-01-09 NOTE — Progress Notes (Signed)
   Subjective: 4 Days Post-Op Procedure(s) (LRB): LEFT TOTAL KNEE ARTHROPLASTY (Left) Patient reports pain as mild.   Patient seen in rounds with Dr. Lequita Halt. Patient is well, and has had no acute complaints or problems. No SOB or chest pain. Continuing to progress slowly with therapy. Labs stable.  Plan is to go Skilled nursing facility after hospital stay.  Objective: Vital signs in last 24 hours: Temp:  [98.4 F (36.9 C)-99.6 F (37.6 C)] 98.4 F (36.9 C) (05/24 0604) Pulse Rate:  [81-96] 83 (05/24 0604) Resp:  [15-19] 19 (05/24 0604) BP: (146-152)/(75-78) 146/78 (05/24 0604) SpO2:  [93 %-95 %] 93 % (05/24 0604)  Intake/Output from previous day:  Intake/Output Summary (Last 24 hours) at 01/09/2018 0720 Last data filed at 01/09/2018 0600 Gross per 24 hour  Intake 600 ml  Output 900 ml  Net -300 ml     Labs: Recent Labs    01/07/18 0546 01/08/18 0539  HGB 10.8* 10.1*   Recent Labs    01/07/18 0546 01/08/18 0539  WBC 13.3* 10.7*  RBC 3.42* 3.21*  HCT 33.0* 30.8*  PLT 198 204   Recent Labs    01/07/18 0546  NA 140  K 3.3*  CL 106  CO2 24  BUN 8  CREATININE 0.55  GLUCOSE 104*  CALCIUM 8.4*    EXAM General - Patient is Alert and Oriented in the moment but does not recall interaction shortly after Extremity - Neurologically intact Intact pulses distally Dorsiflexion/Plantar flexion intact No cellulitis present Compartment soft Dressing/Incision - clean, dry, no drainage Motor Function - intact, moving foot and toes well on exam.   Past Medical History:  Diagnosis Date  . Alcohol abuse   . Alzheimer's disease 05/23/2010  . ANXIETY DEPRESSION 05/23/2010  . Arterial hypotension   . Arthritis   . DEMENTIA 07/20/2008  . Depression    monopolar  . Gait instability   . GERD 05/11/2007   ?  Marland Kitchen History of dehydration   . History of kidney stones   . Hyperlipidemia   . Memory loss   . Microscopic colitis   . Migraine    pt denies  . Multiple falls     . OSTEOARTHRITIS, KNEE 05/23/2010  . Osteopenia   . OSTEOPOROSIS 12/17/2007  . Urinary incontinence due to cognitive impairment   . Vasovagal syncope 06/26/2017  . Wrist pain, right 04/17/2011    Assessment/Plan: 4 Days Post-Op Procedure(s) (LRB): LEFT TOTAL KNEE ARTHROPLASTY (Left) Principal Problem:   OA (osteoarthritis) of knee  Estimated body mass index is 24.75 kg/m as calculated from the following:   Height as of this encounter:  (1.549 m).   Weight as of this encounter: 59.4 kg (131 lb). Advance diet Up with therapy Discharge to SNF when bed available  DVT Prophylaxis - Aspirin Weight-Bearing as tolerated   Continue therapy today. Plan for DC to SNF once bed available. Follow up in office 2 weeks from surgery day. Discharge instructions given.   Dimitri Ped, PA-C Orthopaedic Surgery 01/09/2018, 7:20 AM

## 2018-01-09 NOTE — Discharge Planning (Signed)
Patient IV removed. RN assessment and VS revealed stability for DC to [facility].  Report called s/w Allena Katz, LPN.  Informed of suggested FU appt and appt made.  Scripts printed, signed and placed in packet. Once ready - Family asked to transport to facility - Will be wheeled to front and placed in car for husband.  Once husband arrives (facility agreed to assist with transfer to [facility] and into facility). Going to room 1108 [facility]).

## 2018-01-12 DIAGNOSIS — G309 Alzheimer's disease, unspecified: Secondary | ICD-10-CM | POA: Diagnosis not present

## 2018-01-12 DIAGNOSIS — E876 Hypokalemia: Secondary | ICD-10-CM | POA: Diagnosis not present

## 2018-01-12 DIAGNOSIS — D649 Anemia, unspecified: Secondary | ICD-10-CM | POA: Diagnosis not present

## 2018-01-12 DIAGNOSIS — M1712 Unilateral primary osteoarthritis, left knee: Secondary | ICD-10-CM | POA: Diagnosis not present

## 2018-01-13 DIAGNOSIS — M25562 Pain in left knee: Secondary | ICD-10-CM | POA: Diagnosis not present

## 2018-01-13 DIAGNOSIS — F0390 Unspecified dementia without behavioral disturbance: Secondary | ICD-10-CM | POA: Diagnosis not present

## 2018-01-13 DIAGNOSIS — R2689 Other abnormalities of gait and mobility: Secondary | ICD-10-CM | POA: Diagnosis not present

## 2018-01-15 DIAGNOSIS — R2689 Other abnormalities of gait and mobility: Secondary | ICD-10-CM | POA: Diagnosis not present

## 2018-01-15 DIAGNOSIS — F0390 Unspecified dementia without behavioral disturbance: Secondary | ICD-10-CM | POA: Diagnosis not present

## 2018-01-15 DIAGNOSIS — M25562 Pain in left knee: Secondary | ICD-10-CM | POA: Diagnosis not present

## 2018-01-19 DIAGNOSIS — M25562 Pain in left knee: Secondary | ICD-10-CM | POA: Diagnosis not present

## 2018-01-19 DIAGNOSIS — F0390 Unspecified dementia without behavioral disturbance: Secondary | ICD-10-CM | POA: Diagnosis not present

## 2018-01-19 DIAGNOSIS — R2689 Other abnormalities of gait and mobility: Secondary | ICD-10-CM | POA: Diagnosis not present

## 2018-01-21 DIAGNOSIS — R2689 Other abnormalities of gait and mobility: Secondary | ICD-10-CM | POA: Diagnosis not present

## 2018-01-21 DIAGNOSIS — M25562 Pain in left knee: Secondary | ICD-10-CM | POA: Diagnosis not present

## 2018-01-21 DIAGNOSIS — F0390 Unspecified dementia without behavioral disturbance: Secondary | ICD-10-CM | POA: Diagnosis not present

## 2018-01-23 DIAGNOSIS — Z471 Aftercare following joint replacement surgery: Secondary | ICD-10-CM | POA: Diagnosis not present

## 2018-01-25 DIAGNOSIS — M810 Age-related osteoporosis without current pathological fracture: Secondary | ICD-10-CM | POA: Diagnosis not present

## 2018-01-25 DIAGNOSIS — F419 Anxiety disorder, unspecified: Secondary | ICD-10-CM | POA: Diagnosis not present

## 2018-01-25 DIAGNOSIS — F339 Major depressive disorder, recurrent, unspecified: Secondary | ICD-10-CM | POA: Diagnosis not present

## 2018-01-25 DIAGNOSIS — G301 Alzheimer's disease with late onset: Secondary | ICD-10-CM | POA: Diagnosis not present

## 2018-01-25 DIAGNOSIS — F0280 Dementia in other diseases classified elsewhere without behavioral disturbance: Secondary | ICD-10-CM | POA: Diagnosis not present

## 2018-01-25 DIAGNOSIS — Z471 Aftercare following joint replacement surgery: Secondary | ICD-10-CM | POA: Diagnosis not present

## 2018-02-03 ENCOUNTER — Emergency Department (HOSPITAL_COMMUNITY)
Admission: EM | Admit: 2018-02-03 | Discharge: 2018-02-04 | Disposition: A | Discharge status: Home / Self Care | Payer: Medicare Other | Attending: Emergency Medicine | Admitting: Emergency Medicine

## 2018-02-03 ENCOUNTER — Encounter (HOSPITAL_COMMUNITY): Payer: Self-pay | Admitting: Family Medicine

## 2018-02-03 DIAGNOSIS — R52 Pain, unspecified: Secondary | ICD-10-CM | POA: Diagnosis not present

## 2018-02-03 DIAGNOSIS — R1031 Right lower quadrant pain: Secondary | ICD-10-CM | POA: Insufficient documentation

## 2018-02-03 DIAGNOSIS — N201 Calculus of ureter: Secondary | ICD-10-CM

## 2018-02-03 DIAGNOSIS — G309 Alzheimer's disease, unspecified: Secondary | ICD-10-CM | POA: Insufficient documentation

## 2018-02-03 DIAGNOSIS — Z79899 Other long term (current) drug therapy: Secondary | ICD-10-CM | POA: Insufficient documentation

## 2018-02-03 DIAGNOSIS — R112 Nausea with vomiting, unspecified: Secondary | ICD-10-CM | POA: Insufficient documentation

## 2018-02-03 DIAGNOSIS — Z96652 Presence of left artificial knee joint: Secondary | ICD-10-CM | POA: Diagnosis not present

## 2018-02-03 DIAGNOSIS — R110 Nausea: Secondary | ICD-10-CM | POA: Diagnosis not present

## 2018-02-03 DIAGNOSIS — R1084 Generalized abdominal pain: Secondary | ICD-10-CM | POA: Diagnosis not present

## 2018-02-03 DIAGNOSIS — I10 Essential (primary) hypertension: Secondary | ICD-10-CM | POA: Diagnosis not present

## 2018-02-03 DIAGNOSIS — Z7982 Long term (current) use of aspirin: Secondary | ICD-10-CM | POA: Diagnosis not present

## 2018-02-03 DIAGNOSIS — Z96651 Presence of right artificial knee joint: Secondary | ICD-10-CM | POA: Insufficient documentation

## 2018-02-03 DIAGNOSIS — N23 Unspecified renal colic: Secondary | ICD-10-CM | POA: Insufficient documentation

## 2018-02-03 LAB — CBC
HEMATOCRIT: 37.1 % (ref 36.0–46.0)
Hemoglobin: 12.2 g/dL (ref 12.0–15.0)
MCH: 32.6 pg (ref 26.0–34.0)
MCHC: 32.9 g/dL (ref 30.0–36.0)
MCV: 99.2 fL (ref 78.0–100.0)
Platelets: 269 10*3/uL (ref 150–400)
RBC: 3.74 MIL/uL — ABNORMAL LOW (ref 3.87–5.11)
RDW: 13.5 % (ref 11.5–15.5)
WBC: 7.6 10*3/uL (ref 4.0–10.5)

## 2018-02-03 MED ORDER — ONDANSETRON HCL 4 MG/2ML IJ SOLN
4.0000 mg | Freq: Once | INTRAMUSCULAR | Status: AC
  Administered 2018-02-03: 4 mg via INTRAVENOUS
  Filled 2018-02-03: qty 2

## 2018-02-03 MED ORDER — FENTANYL CITRATE (PF) 100 MCG/2ML IJ SOLN
50.0000 ug | Freq: Once | INTRAMUSCULAR | Status: AC
  Administered 2018-02-03: 50 ug via INTRAVENOUS
  Filled 2018-02-03: qty 2

## 2018-02-03 MED ORDER — SODIUM CHLORIDE 0.9 % IV BOLUS
1000.0000 mL | Freq: Once | INTRAVENOUS | Status: AC
  Administered 2018-02-03: 1000 mL via INTRAVENOUS

## 2018-02-03 NOTE — ED Notes (Signed)
Bed: [id number] Expected date:  Expected time:  Means of arrival:  Comments: Abdominal pain

## 2018-02-03 NOTE — ED Triage Notes (Signed)
[facility] Patient is complaining of right lower abd pain with nausea that started about an hour ago. Patient rates pain 5/10. EMS reports her abd is tender to touch. She has a history of dementia and her spouse is her primary care taker.

## 2018-02-03 NOTE — ED Provider Notes (Signed)
[facility]-EMERGENCY DEPT Provider Note   CSN: [id number] Arrival date & time: 02/03/18  2245     History   Chief Complaint Chief Complaint  Patient presents with  . Abdominal Pain    HPI Kim Hernandez is a 77 y.o. female.   77 year old female with a history of dyslipidemia, esophageal reflux, kidney stones, dementia presents to the ED for complaints of abdominal pain.  Abdominal pain began around 2100 tonight.  It is fairly generalized, though patient complaining of increased pain in his right lower quadrant.  She has had nausea as well as dry heaves.  Husband reports a small, constipated bowel movement around the time her symptoms began.  She has not had any known urinary symptoms or fevers.  No documented history of abdominal surgeries.  Level 5 caveat secondary to dementia     Past Medical History:  Diagnosis Date  . Alcohol abuse   . Alzheimer's disease 05/23/2010  . ANXIETY DEPRESSION 05/23/2010  . Arterial hypotension   . Arthritis   . DEMENTIA 07/20/2008  . Depression    monopolar  . Gait instability   . GERD 05/11/2007   ?  Marland Kitchen. History of dehydration   . History of kidney stones   . Hyperlipidemia   . Memory loss   . Microscopic colitis   . Migraine    pt denies  . Multiple falls   . OSTEOARTHRITIS, KNEE 05/23/2010  . Osteopenia   . OSTEOPOROSIS 12/17/2007  . Urinary incontinence due to cognitive impairment   . Vasovagal syncope 06/26/2017  . Wrist pain, right 04/17/2011    Patient Active Problem List   Diagnosis Date Noted  . OA (osteoarthritis) of knee 01/05/2018  . Vasovagal syncope 06/26/2017  . Dehydration, moderate 06/26/2017  . Urinary incontinence due to cognitive impairment 06/26/2017  . History of dehydration 03/26/2017  . Arterial hypotension 03/26/2017  . Syncope and collapse 03/26/2017  . Gait instability 11/13/2016  . Hyperlipidemia 04/17/2011  . Major depression in remission (HCC) 05/23/2010  . Late onset  Alzheimer's disease with behavioral disturbance 05/23/2010    Past Surgical History:  Procedure Laterality Date  . COLONOSCOPY  07/01/2017  . DILATION AND CURETTAGE OF UTERUS     forty years ago  . JOINT REPLACEMENT     Left total knee  Dr. Lequita HaltAluisio 01-05-18  . TONSILLECTOMY    . TOTAL KNEE ARTHROPLASTY Right   . TOTAL KNEE ARTHROPLASTY Left 01/05/2018   Procedure: LEFT TOTAL KNEE ARTHROPLASTY;  Surgeon: Ollen GrossAluisio, Frank, MD;  Location: [facility];  Service: Orthopedics;  Laterality: Left;  with block     OB History   None      Home Medications    Prior to Admission medications   Medication Sig Start Date End Date Taking? Authorizing Provider  alendronate (FOSAMAX) 70 MG tablet Take 70 mg by mouth every Wednesday. Take with a full glass of water on an empty stomach.   Yes [provider]  aspirin EC 325 MG EC tablet Take 1 tablet (325 mg total) by mouth 2 (two) times daily. Take one tablet two times a day for three weeks to prevent blood clots. 01/06/18  Yes Constable, Amber, PA-C  atorvastatin (LIPITOR) 40 MG tablet TAKE 1 TABLET(40 MG) BY MOUTH DAILY Patient taking differently: TAKE 1 TABLET(40 MG) BY MOUTH DAILY IN THE EVENING 10/28/16  Yes Myrlene Brokerrawford, Elizabeth A, MD  cholecalciferol (VITAMIN D) 1000 units tablet Take 1,000 Units by mouth every evening.    Yes [provider]  citalopram (CELEXA) 20 MG tablet Take 1 tablet (20 mg total) by mouth daily. Patient taking differently: Take 20 mg by mouth every evening.  10/30/15  Yes Myrlene Broker, MD  donepezil (ARICEPT) 10 MG tablet Take 1 tablet (10 mg total) by mouth at bedtime. Patient taking differently: Take 10 mg by mouth every evening.  01/06/17  Yes Dohmeier, Porfirio Mylar, MD  fludrocortisone (FLORINEF) 0.1 MG tablet take 0.1mg  by mouth daily in the evening 10/17/16  Yes [provider]  memantine (NAMENDA XR) 28 MG CP24 24 hr capsule TAKE 1 CAPSULE(28 MG) BY MOUTH DAILY Patient taking differently: TAKE 1  CAPSULE(28 MG) BY MOUTH DAILY IN THE EVENING 04/28/17  Yes Dohmeier, Porfirio Mylar, MD  methocarbamol (ROBAXIN) 500 MG tablet Take 1 tablet (500 mg total) by mouth every 6 (six) hours as needed for muscle spasms. 01/06/18  Yes Constable, Amber, PA-C  traMADol (ULTRAM) 50 MG tablet Take 1-2 tablets (50-100 mg total) by mouth every 6 (six) hours as needed for moderate pain. 01/07/18  Yes Constable, Amber, PA-C  vitamin B-12 (CYANOCOBALAMIN) 100 MCG tablet Take 100 mcg by mouth every evening.   Yes [provider]  HYDROcodone-acetaminophen (NORCO/VICODIN) 5-325 MG tablet Take 1 tablet by mouth every 4 (four) hours as needed for severe pain. 02/04/18   Antony Madura, PA-C  ondansetron (ZOFRAN ODT) 4 MG disintegrating tablet Take 1 tablet (4 mg total) by mouth every 8 (eight) hours as needed for nausea or vomiting. 02/04/18   Antony Madura, PA-C  potassium chloride SA (K-DUR,KLOR-CON) 20 MEQ tablet Take 1 tablet (20 mEq total) by mouth 2 (two) times daily. Patient not taking: Reported on 02/03/2018 01/07/18   Dimitri Ped, PA-C  tamsulosin (FLOMAX) 0.4 MG CAPS capsule Take 1 capsule (0.4 mg total) by mouth daily. 02/04/18   Antony Madura, PA-C    Family History Family History  Problem Relation Age of Onset  . Anemia Mother   . Alzheimer's disease Mother   . Emphysema Mother        smoker  . Obesity Sister   . Emphysema Sister        smoker  . Coronary artery disease Brother        smoker  . Heart attack Brother   . Obesity Daughter   . Depression Daughter   . Alcohol abuse Son   . Depression Son   . Suicidality Son        10/12-commited suicide in a manic depressive episode  . Colon cancer Neg Hx   . Esophageal cancer Neg Hx   . Rectal cancer Neg Hx   . Stomach cancer Neg Hx     Social History Social History   Tobacco Use  . Smoking status: Never Smoker  . Smokeless tobacco: Never Used  Substance Use Topics  . Alcohol use: Yes    Alcohol/week: 0.6 oz    Types: 1 Cans of beer per  week    Comment: quit- had a problem w/ alcohol and has abstained for years  . Drug use: No     Allergies   Other   Review of Systems Review of Systems  Unable to perform ROS: Dementia     Physical Exam Updated Vital Signs BP (!) 148/87   Pulse 78   Resp (!) 21   SpO2 100%   Physical Exam  Constitutional: She is oriented to person, place, and time. She appears well-developed and well-nourished. No distress.  Nontoxic appearing and in NAD. Intermittent belching.  HENT:  Head: Normocephalic and atraumatic.  Eyes: Conjunctivae and EOM are normal. No scleral icterus.  Neck: Normal range of motion.  Cardiovascular: Normal rate, regular rhythm and intact distal pulses.  Pulmonary/Chest: Effort normal. No stridor. No respiratory distress. She has no wheezes.  Respirations even and unlabored  Abdominal: Soft. She exhibits no mass. There is tenderness.  Soft abdomen, nondistended. Generalized TTP. No palpable masses. No peritoneal signs.  Musculoskeletal: Normal range of motion.  Neurological: She is alert and oriented to person, place, and time. She exhibits normal muscle tone. Coordination normal.  Skin: Skin is warm and dry. No rash noted. She is not diaphoretic. No erythema. No pallor.  Psychiatric: She has a normal mood and affect. Her behavior is normal.  Nursing note and vitals reviewed.    ED Treatments / Results  Labs (all labs ordered are listed, but only abnormal results are displayed) Labs Reviewed  COMPREHENSIVE METABOLIC PANEL - Abnormal; Notable for the following components:      Result Value   Potassium 3.4 (*)    Glucose, Bld 124 (*)    BUN 21 (*)    Creatinine, Ser 1.01 (*)    AST 61 (*)    ALT 70 (*)    GFR calc non Af Amer 52 (*)    All other components within normal limits  CBC - Abnormal; Notable for the following components:   RBC 3.74 (*)    All other components within normal limits  URINALYSIS, ROUTINE W REFLEX MICROSCOPIC - Abnormal;  Notable for the following components:   Color, Urine STRAW (*)    Hgb urine dipstick LARGE (*)    Ketones, ur 20 (*)    Bacteria, UA RARE (*)    All other components within normal limits  LIPASE, BLOOD    EKG None  Radiology Ct Abdomen Pelvis W Contrast  Result Date: 02/04/2018 CLINICAL DATA:  Right lower quadrant pain and nausea for several hours. EXAM: CT ABDOMEN AND PELVIS WITH CONTRAST TECHNIQUE: Multidetector CT imaging of the abdomen and pelvis was performed using the standard protocol following bolus administration of intravenous contrast. CONTRAST:  ISOVUE-300 IOPAMIDOL (ISOVUE-300) INJECTION 61% COMPARISON:  07/08/2017 FINDINGS: Lower chest: Atelectasis or fibrosis in the lung bases. Hepatobiliary: Cholelithiasis. No gallbladder wall thickening or inflammatory changes. No bile duct dilatation. No focal liver lesions. Pancreas: Unremarkable. No pancreatic ductal dilatation or surrounding inflammatory changes. Spleen: Normal in size without focal abnormality. Adrenals/Urinary Tract: No adrenal gland nodules. 3 mm stone in the right ureterovesical junction with right hydronephrosis and hydroureter. Edema around the right kidney and ureter. No bladder wall thickening. Left kidney and ureter are unremarkable except for pallor pelvic cysts. Stomach/Bowel: Stomach is within normal limits. Appendix is not identified. No evidence of bowel wall thickening, distention, or inflammatory changes. Vascular/Lymphatic: Aortic atherosclerosis. No enlarged abdominal or pelvic lymph nodes. Reproductive: Uterus and bilateral adnexa are unremarkable. Other: No abdominal wall hernia or abnormality. No abdominopelvic ascites. Musculoskeletal: Degenerative changes in the spine and hips. No destructive bone lesions. IMPRESSION: 1. 3 mm stone in the right ureterovesical junction with moderate proximal obstruction. 2. Cholelithiasis without evidence of acute cholecystitis. 3. Appendix is not identified but no  inflammatory changes are seen in the right lower quadrant. Electronically Signed   By: Burman Nieves M.D.   On: 02/04/2018 01:43    Procedures Procedures (including critical care time)  Medications Ordered in ED Medications  iopamidol (ISOVUE-300) 61 % injection (has no administration in time range)  ondansetron (ZOFRAN)  injection 4 mg (4 mg Intravenous Given 02/03/18 2351)  fentaNYL (SUBLIMAZE) injection 50 mcg (50 mcg Intravenous Given 02/03/18 2352)  sodium chloride 0.9 % bolus 1,000 mL (0 mLs Intravenous Stopped 02/04/18 0107)  iopamidol (ISOVUE-300) 61 % injection 100 mL (100 mLs Intravenous Contrast Given 02/04/18 0110)  ketorolac (TORADOL) 30 MG/ML injection 15 mg (15 mg Intravenous Given 02/04/18 0159)  fentaNYL (SUBLIMAZE) injection 50 mcg (50 mcg Intravenous Given 02/04/18 0200)    3:20 AM Patient reassessed.  She is resting comfortably.  Denies any complaints of pain.  Nausea has subsided.   Initial Impression / Assessment and Plan / ED Course  I have reviewed the triage vital signs and the nursing notes.  Pertinent labs & imaging results that were available during my care of the patient were reviewed by me and considered in my medical decision making (see chart for details).     Patient has been diagnosed with a kidney stone via CT. There is no evidence of significant hydronephrosis, serum creatine reassuring, vitals sign stable and the pt does not have irratractable vomiting. No associated UTI. Patient will be discharged home with pain medications and has been advised to follow up with her Urologist. Return precautions discussed and provided. Patient discharged in stable condition; husband/caregiver with no unaddressed concerns.    Final Clinical Impressions(s) / ED Diagnoses   Final diagnoses:  Renal colic on right side  Ureterolithiasis    ED Discharge Orders        Ordered    tamsulosin (FLOMAX) 0.4 MG CAPS capsule  Daily     02/04/18 0332     HYDROcodone-acetaminophen (NORCO/VICODIN) 5-325 MG tablet  Every 4 hours PRN     02/04/18 0332    ondansetron (ZOFRAN ODT) 4 MG disintegrating tablet  Every 8 hours PRN     02/04/18 0332       Antony Madura, PA-C 02/04/18 1610    Geoffery Lyons, MD 02/04/18 [phone]

## 2018-02-03 NOTE — ED Notes (Signed)
Pt aware urine sample is needed. Purewick placed  

## 2018-02-04 ENCOUNTER — Encounter (HOSPITAL_COMMUNITY): Payer: Self-pay

## 2018-02-04 ENCOUNTER — Emergency Department (HOSPITAL_COMMUNITY): Payer: Medicare Other

## 2018-02-04 DIAGNOSIS — R1031 Right lower quadrant pain: Secondary | ICD-10-CM | POA: Diagnosis not present

## 2018-02-04 LAB — COMPREHENSIVE METABOLIC PANEL
ALBUMIN: 3.9 g/dL (ref 3.5–5.0)
ALK PHOS: 124 U/L (ref 38–126)
ALT: 70 U/L — ABNORMAL HIGH (ref 14–54)
ANION GAP: 11 (ref 5–15)
AST: 61 U/L — AB (ref 15–41)
BILIRUBIN TOTAL: 0.6 mg/dL (ref 0.3–1.2)
BUN: 21 mg/dL — AB (ref 6–20)
CALCIUM: 9.3 mg/dL (ref 8.9–10.3)
CO2: 26 mmol/L (ref 22–32)
Chloride: 102 mmol/L (ref 101–111)
Creatinine, Ser: 1.01 mg/dL — ABNORMAL HIGH (ref 0.44–1.00)
GFR calc Af Amer: >60 mL/min (ref >60)
GFR calc non Af Amer: 52 mL/min — ABNORMAL LOW (ref >60)
GLUCOSE: 124 mg/dL — AB (ref 65–99)
Potassium: 3.4 mmol/L — ABNORMAL LOW (ref 3.5–5.1)
SODIUM: 139 mmol/L (ref 135–145)
TOTAL PROTEIN: 7.2 g/dL (ref 6.5–8.1)

## 2018-02-04 LAB — URINALYSIS, ROUTINE W REFLEX MICROSCOPIC
BILIRUBIN URINE: NEGATIVE
Glucose, UA: NEGATIVE mg/dL
KETONES UR: 20 mg/dL — AB
LEUKOCYTES UA: NEGATIVE
Nitrite: NEGATIVE
Protein, ur: NEGATIVE mg/dL
SPECIFIC GRAVITY, URINE: 1.01 (ref 1.005–1.030)
pH: 8 (ref 5.0–8.0)

## 2018-02-04 LAB — LIPASE, BLOOD: Lipase: 49 U/L (ref 11–51)

## 2018-02-04 MED ORDER — KETOROLAC TROMETHAMINE 30 MG/ML IJ SOLN
15.0000 mg | Freq: Once | INTRAMUSCULAR | Status: AC
  Administered 2018-02-04: 15 mg via INTRAVENOUS
  Filled 2018-02-04: qty 1

## 2018-02-04 MED ORDER — IOPAMIDOL (ISOVUE-300) INJECTION 61%
100.0000 mL | Freq: Once | INTRAVENOUS | Status: AC | PRN
  Administered 2018-02-04: 100 mL via INTRAVENOUS

## 2018-02-04 MED ORDER — ONDANSETRON 4 MG PO TBDP: 4.0000 mg | ORAL_TABLET | Freq: Three times a day (TID) | ORAL | 0 refills | Status: DC | PRN

## 2018-02-04 MED ORDER — TAMSULOSIN HCL 0.4 MG PO CAPS: 0.4000 mg | ORAL_CAPSULE | Freq: Every day | ORAL | 0 refills | Status: DC

## 2018-02-04 MED ORDER — FENTANYL CITRATE (PF) 100 MCG/2ML IJ SOLN
50.0000 ug | Freq: Once | INTRAMUSCULAR | Status: AC
  Administered 2018-02-04: 50 ug via INTRAVENOUS
  Filled 2018-02-04: qty 2

## 2018-02-04 MED ORDER — HYDROCODONE-ACETAMINOPHEN 5-325 MG PO TABS: 1.0000 | ORAL_TABLET | ORAL | 0 refills | Status: DC | PRN

## 2018-02-04 MED ORDER — IOPAMIDOL (ISOVUE-300) INJECTION 61%
INTRAVENOUS | Status: AC
  Filled 2018-02-04: qty 100

## 2018-02-04 NOTE — Discharge Instructions (Signed)
You were found to have a 3 mm kidney stone at the end of your right ureter.  This is the cause of your pain today.  Discontinue the tramadol you are prescribed for pain management.  Instead, we recommend that you take Norco for pain control.  Take as prescribed.  This medication may make you drowsy, so be cautious when taking it.  Continue with Flomax as prescribed to promote movement of your kidney stone.  You may use Zofran as needed for nausea.  Follow-up with your urologist regarding your visit today.  Return to the ED for new or concerning symptoms.

## 2018-02-04 NOTE — ED Notes (Signed)
Patient transported to CT 

## 2018-02-10 DIAGNOSIS — Z96652 Presence of left artificial knee joint: Secondary | ICD-10-CM | POA: Diagnosis not present

## 2018-02-10 DIAGNOSIS — Z5189 Encounter for other specified aftercare: Secondary | ICD-10-CM | POA: Insufficient documentation

## 2018-02-10 DIAGNOSIS — Z471 Aftercare following joint replacement surgery: Secondary | ICD-10-CM | POA: Diagnosis not present

## 2018-02-17 DIAGNOSIS — N132 Hydronephrosis with renal and ureteral calculous obstruction: Secondary | ICD-10-CM | POA: Diagnosis not present

## 2018-02-17 DIAGNOSIS — N201 Calculus of ureter: Secondary | ICD-10-CM | POA: Diagnosis not present

## 2018-02-17 DIAGNOSIS — R3121 Asymptomatic microscopic hematuria: Secondary | ICD-10-CM | POA: Diagnosis not present

## 2018-02-22 DIAGNOSIS — Z471 Aftercare following joint replacement surgery: Secondary | ICD-10-CM | POA: Diagnosis not present

## 2018-03-13 ENCOUNTER — Other Ambulatory Visit: Payer: Self-pay | Admitting: Neurology

## 2018-03-18 DIAGNOSIS — R3121 Asymptomatic microscopic hematuria: Secondary | ICD-10-CM | POA: Diagnosis not present

## 2018-03-18 DIAGNOSIS — N3944 Nocturnal enuresis: Secondary | ICD-10-CM | POA: Diagnosis not present

## 2018-03-18 DIAGNOSIS — Z87442 Personal history of urinary calculi: Secondary | ICD-10-CM | POA: Diagnosis not present

## 2018-03-25 DIAGNOSIS — Z471 Aftercare following joint replacement surgery: Secondary | ICD-10-CM | POA: Diagnosis not present

## 2018-03-31 ENCOUNTER — Ambulatory Visit: Payer: Medicare Other | Admitting: Neurology

## 2018-03-31 ENCOUNTER — Encounter: Payer: Self-pay | Admitting: Neurology

## 2018-03-31 VITALS — BP 132/77 | HR 61 | Ht 63.0 in | Wt 129.0 lb

## 2018-03-31 DIAGNOSIS — R2689 Other abnormalities of gait and mobility: Secondary | ICD-10-CM | POA: Insufficient documentation

## 2018-03-31 DIAGNOSIS — F0281 Dementia in other diseases classified elsewhere with behavioral disturbance: Secondary | ICD-10-CM

## 2018-03-31 DIAGNOSIS — F05 Delirium due to known physiological condition: Secondary | ICD-10-CM | POA: Insufficient documentation

## 2018-03-31 DIAGNOSIS — G301 Alzheimer's disease with late onset: Secondary | ICD-10-CM

## 2018-03-31 MED ORDER — DONEPEZIL HCL 10 MG PO TABS: 10.0000 mg | ORAL_TABLET | Freq: Every evening | ORAL | 1 refills | Status: DC

## 2018-03-31 NOTE — Progress Notes (Signed)
[facility]  Provider:  Dr Haaris Metallo Referring Provider: [name], MD Primary Care Physician:  Kim Pao, MD  Chief Complaint  Patient presents with  . Follow-up    pt with husband, rm 64. states things have been ok    HPI:   03-31-2018, Rv with Kim Hernandez, 77 year old caucasian female member of the [place] witnesses and status post left knee replacement surgery on 01-05-2018. She still has a tendency to fall but is using her walker now. That protects her well. Dr. Synthia Innocent had a Rv with her last week and she was doing very well. Her speech is more haltering and she arrests mid sentence , her husband noted increased word finding delay. [facility] [facility] for 14 days was including PT and speech therapy. She was  there confused at night and often had a nurse to call her husband to pick her up - STAT. Her knee still hurts a bit but she is much improved.    12-24-2017,She has had more falls, 5 or 6 in total. Not related to fainting, no injuries resulted. She sleeps well. Her appetite is good.  Her speech is less clear and less formed. A lisp? She also has less vocabulary. She cannot find joy in reading as she forgets the previous pages. She reports good ability to smell and taste. She walks with a walker, awaiting knee replacement.     Kim Hernandez is a 77 y.o. female , who has visited the ER twice for syncope, dehydration.  Kim Hernandez presents for the first time today with slight dysarthria, no dysphonia.  There is a slight lisp that I had never noticed before.  Her husband states that her ER visits were related to dehydration and that he has a hard time keeping her well-hydrated and reminding her to drink.  Today's memory test shows a decline at 9 out of 30 points.  She had trouble with calculation which was not difficult for her in the past, but she also is not completely oriented to date and place.  Short-term memory is the most affected.  She is able to  follow to step commands.  She is able to name objects. MMSE - Mini Mental State Exam 03/31/2018 12/24/2017 06/26/2017  Orientation to time 1 1 0  Orientation to Place _0 Registration _1 Attention/ Calculation 0 1 0  Recall 0 0 0  Language- name 2 objects _2 Language- repeat _3 Language- follow 3 step command _4 Language- read & follow direction _5 Write a sentence 0 - 0  Copy design 0 0 0  Total score 12 - 9     MMSE - Mini Mental State Exam 03/31/2018 12/24/2017 06/26/2017 03/26/2017 09/25/2016 03/27/2016 03/27/2016  Orientation to time 1 1 0 _6 -  Orientation to Place _7 -  Registration _8 Attention/ Calculation 0 1 0 2 0 5 5  Recall 0 0 0 0 0 0 0  Language- name 2 objects _9 Language- repeat _10 Language- follow 3 step command _11 Language- read & follow direction _12 Write a sentence 0 - 0 _13 Copy design 0 0 0  0 1 0 0  Total score 12 - _0 -    RV from 03-26-17 ,seen here as a revisit  from Dr. Osborne Casco for memory loss. Interval history from 03/26/2017, I have followed Kim Hernandez by now for 9 years. Her clinical course and imaging studies were consistent with a diagnosis of Alzheimer's disease and the patient has been aware of his diagnosis. She underwent a PET scan before. Over the last 18 months to have been a more advancing progression of memory loss. The couple just celebrated their 60th anniversary in May 2018. Unfortunately, Kim Hernandez felt ill in early spring and had to be hospitalized. She had developed diarrhea, dehydration and fainted. She was hospitalized at [place] Status Examination revealed 16/30 points, she was good with animal naming she had some trouble with a clock drawing. Her husband noted a progressive deterioration , physically she is weaker , too. She was mowing the lawn before, now she has difficulties to get in and out of the car. She will  need PT now.    HISTORY:  Kim Hernandez is a right-handed, married Caucasian female patient presented first in early 2009 for memory evaluations,  Her clinical course and her imaging studies are consistent with a diagnosis of Alzheimer's disease. The patient has always been aware of the diagnosis.She started on Aricept and reported no side effects from the medication. She reports no sundowning,  no change in weight.  no change in smell or taste,  has a good appetite and is socially active as well as physically active.Kim Hernandez is able to orient herself in time frame throughout the day, knows when its morshe knows the date, but today stated it is Mob nday, when it was actually Friday.  She remains completely independent in her daily life with all activities of daily living. She did get lost twice  and therefore stopped driving in 5320 .   In May 2012  28/30 the patient underwent a PET scan which showed the evidence the distribution of angles indicative of Alzheimer's -her short-term memory is still the only deficit. sometime she had trouble naming people or misplacing things,on a Mini-Mental test in November 2012 her Mini-Mental Status Examination was 25 points and her animal fluency test 21 points In August 2014 her Mini-Mental Status Examination was 22 and animal fluency test 20 be also added a MOCA , in which  she did very well- with the trail making and visual spatial demands of that test, and  to 24 words. She was quite frustrated with a five-minute recall failure.  "I am still playing Scrabble but I just don't win anymore " - Today's geriatric depression score was endorsed at 4 points, and the patient's Mini-Mental Status Examination revealed 19 out of 30 points. Her main problem is the immediate recall of 3 words and she also had some trouble counting backwards by sevens. No is a disorientation to season year day of the week. She was aware of the month of August.  Social history : Mrs Hernandez  married at age 61 , met her husband at age 69. The couple has  2 children,  Born at maternal age  68 and age 64.      MMSE - Mini Mental State Exam 03/31/2018 12/24/2017 06/26/2017 03/26/2017 09/25/2016 03/27/2016 03/27/2016  Orientation to time 1 1 0 _1 -  Orientation to Place _2 -  Registration _3 3  3  Attention/ Calculation 0 1 0 2 0 5 5  Recall 0 0 0 0 0 0 0  Language- name 2 objects _0 Language- repeat _1 Language- follow 3 step command _2 Language- read & follow direction _3 Write a sentence 0 - 0 _4 Copy design 0 0 0 0 1 0 0  Total score 12 - _5 -                        Result Notes     Notes Recorded by Larey Seat, MD on 11/07/2014 at 9:11 AM Brain atrophy in the area of memory centers, the mesio-temporal lobes. Patient already received this message in a personal phone call. CD          Vitals     Height Weight BMI (Calculated)    _6  (1.575 m) 130 lb (58.968 kg) 23.8      Interpretation Summary        Review of Systems: Out of a complete 14 system review, the patient complains of only the following symptoms, and all other reviewed systems are negative. "Getting forgetful" sense of doom, anxiety , depression.   Fainting - dehydration-   Family History  Problem Relation Age of Onset  . Anemia Mother   . Alzheimer's disease Mother   . Emphysema Mother        smoker  . Obesity Sister   . Emphysema Sister        smoker  . Coronary artery disease Brother        smoker  . Heart attack Brother   . Obesity Daughter   . Depression Daughter   . Alcohol abuse Son   . Depression Son   . Suicidality Son        10/12-commited suicide in a manic depressive episode  . Colon cancer Neg Hx   . Esophageal cancer Neg Hx   . Rectal cancer Neg Hx   . Stomach cancer Neg Hx     Past Medical History:  Diagnosis Date  . Alcohol abuse   . Alzheimer's disease 05/23/2010  .  ANXIETY DEPRESSION 05/23/2010  . Arterial hypotension   . Arthritis   . DEMENTIA 07/20/2008  . Depression    monopolar  . Gait instability   . GERD 05/11/2007   ?  Marland Kitchen History of dehydration   . History of kidney stones   . Hyperlipidemia   . Memory loss   . Microscopic colitis   . Migraine    pt denies  . Multiple falls   . OSTEOARTHRITIS, KNEE 05/23/2010  . Osteopenia   . OSTEOPOROSIS 12/17/2007  . Urinary incontinence due to cognitive impairment   . Vasovagal syncope 06/26/2017  . Wrist pain, right 04/17/2011    Past Surgical History:  Procedure Laterality Date  . COLONOSCOPY  07/01/2017  . DILATION AND CURETTAGE OF UTERUS     forty years ago  . JOINT REPLACEMENT     Left total knee  Dr. Wynelle Link 01-05-18  . TONSILLECTOMY    . TOTAL KNEE ARTHROPLASTY Right   . TOTAL KNEE ARTHROPLASTY Left 01/05/2018   Procedure: LEFT TOTAL KNEE ARTHROPLASTY;  Surgeon: Gaynelle Arabian, MD;  Location: [facility];  Service: Orthopedics;  Laterality: Left;  with block  Current Outpatient Medications  Medication Sig Dispense Refill  . alendronate (FOSAMAX) 70 MG tablet Take 70 mg by mouth every Wednesday. Take with a full glass of water on an empty stomach.    Marland Kitchen atorvastatin (LIPITOR) 40 MG tablet TAKE 1 TABLET(40 MG) BY MOUTH DAILY (Patient taking differently: TAKE 1 TABLET(40 MG) BY MOUTH DAILY IN THE EVENING) 90 tablet 0  . cholecalciferol (VITAMIN D) 1000 units tablet Take 1,000 Units by mouth every evening.     . citalopram (CELEXA) 20 MG tablet Take 1 tablet (20 mg total) by mouth daily. (Patient taking differently: Take 20 mg by mouth every evening. ) 90 tablet 3  . donepezil (ARICEPT) 10 MG tablet Take 1 tablet (10 mg total) by mouth at bedtime. (Patient taking differently: Take 10 mg by mouth every evening. ) 30 tablet 3  . fludrocortisone (FLORINEF) 0.1 MG tablet take 0.55m by mouth daily in the evening  5  . HYDROcodone-acetaminophen (NORCO/VICODIN) 5-325 MG tablet Take 1 tablet by mouth  every 4 (four) hours as needed for severe pain. 15 tablet 0  . memantine (NAMENDA XR) 28 MG CP24 24 hr capsule TAKE 1 CAPSULE(28 MG) BY MOUTH DAILY IN THE EVENING 90 capsule 2  . methocarbamol (ROBAXIN) 500 MG tablet Take 1 tablet (500 mg total) by mouth every 6 (six) hours as needed for muscle spasms. 40 tablet 0  . mirabegron ER (MYRBETRIQ) 50 MG TB24 tablet Take 50 mg by mouth daily.    . tamsulosin (FLOMAX) 0.4 MG CAPS capsule Take 1 capsule (0.4 mg total) by mouth daily. 7 capsule 0  . traMADol (ULTRAM) 50 MG tablet Take 1-2 tablets (50-100 mg total) by mouth every 6 (six) hours as needed for moderate pain. 40 tablet 0  . vitamin B-12 (CYANOCOBALAMIN) 100 MCG tablet Take 100 mcg by mouth every evening.     No current facility-administered medications for this visit.     Allergies as of 03/31/2018 - Review Complete 03/31/2018  Allergen Reaction Noted  . Other  01/02/2018    Vitals: BP 132/77   Pulse 61   Ht _0  (1.6 m)   Wt 129 lb (58.5 kg)   BMI 22.85 kg/m  Last Weight:  Wt Readings from Last 1 Encounters:  03/31/18 129 lb (58.5 kg)   Last Height:   Ht Readings from Last 1 Encounters:  03/31/18 _1  (1.6 m)     Physical exam:  General: The patient is awake, alert and appears not in acute distress.  The patient is well groomed, cleanly dressed, in low pumps (!)  Head: Normocephalic, atraumatic.  Neck is supple. Mallampati 2, neck circumference: 13.5 ,  Cardiovascular:  Regular rate and rhythm, without  murmurs or carotid bruit, and without distended neck veins. Respiratory: Lungs are clear to auscultation. Skin:  Without evidence of edema, or rash-  BMI is 22.8  Neurologic exam : The patient is awake and alert,  But not longer fully oriented to place and time.   Memory subjective described as impaired , but the patient appears happy and outgoing, she has not longer a fluent speech pattern, and she often looks to her husband to answer questions. .Marland Kitchen  Speech is fluent  without dysarthria, but dysphonia ,  Aphasia.  Mood and affect are defiant - she is not happy to be here.  She is "feisty".    Cranial nerves:  She reports no changes in sense of smell or taste. Her appetite is good.  Pupils are  equal and briskly reactive to light. Facial sensation intact to fine touch.  Facial motor strength is symmetric and tongue and uvula move midline. Motor exam:  Increased  tone , gegenhalten , unable to relax.  No parkinsonism. Finger-to-nose maneuver with mild tremor on the left side.  More rigor on the left ,  pronator drift, weakness of grip strength.  Gait and station: difficulties to rise from a seated position.  Needs an armchair with high arm rests.  Patient walks with a walker. She was able to walk with me, cautiously and she has been unable to turn, is insecure, fears to fall.  She needed almost 7 small steps to turn. she denies knee pain. She  held on to the door frame before turning. She is slightly unsteady and drifted more to the left.  Deep tendon reflexes: All still symmetric - patella reflex deferred.  Babinski maneuver deferred- unable to test, extremely ticklish !  Assessment:  After physical and neurologic examination, review of laboratory studies, imaging, neurophysiology testing.  45 minute visit with more than 50% of the face to face time dedicated to a neurodegenerative disorder associated with a slow progression of memory decline -she is still sleeping well. She has reached a progressed d stage of Alzheimer's disease.    At this time there is no longer need for nursing at home - she could benefit from participation in group exercises. She has now a cleaner, but forgot.      She will stay on current Meds indefinably. She will use melatonin if her sleep changes.   Follow up in 6 month with Np or me  , 30 minutes, MMSE     Larey Seat, MD  [facility] PCP:  Dr Domenick Gong.

## 2018-04-03 ENCOUNTER — Ambulatory Visit (INDEPENDENT_AMBULATORY_CARE_PROVIDER_SITE_OTHER): Payer: Medicare Other | Admitting: Podiatry

## 2018-04-03 ENCOUNTER — Encounter: Payer: Self-pay | Admitting: Podiatry

## 2018-04-03 VITALS — BP 122/67 | HR 71

## 2018-04-03 DIAGNOSIS — M79675 Pain in left toe(s): Secondary | ICD-10-CM

## 2018-04-03 DIAGNOSIS — M79674 Pain in right toe(s): Secondary | ICD-10-CM | POA: Diagnosis not present

## 2018-04-03 DIAGNOSIS — B351 Tinea unguium: Secondary | ICD-10-CM | POA: Diagnosis not present

## 2018-04-06 ENCOUNTER — Encounter: Payer: Self-pay | Admitting: Podiatry

## 2018-04-06 NOTE — Progress Notes (Signed)
Subjective: Kim Hernandez  Is a 77 yo female with dementia who presents today with her husband. Husband relates  cc of painful, discolored, thick toenails which interfere with daily activities and routine tasks. Most symptomatic is her left 4th digit which is really incurvated.  Pain is aggravated when wearing enclosed shoe gear. Pain is getting progressively worse and relieved with periodic professional debridement.  Medical History    06/26/2017 Vasovagal syncope  04/17/2011 Wrist pain, right  05/23/2010 OSTEOARTHRITIS, KNEE  05/23/2010 ANXIETY DEPRESSION  05/23/2010 Alzheimer's disease  07/20/2008 DEMENTIA  12/17/2007 OSTEOPOROSIS  05/11/2007 GERD   Date Unknown Alcohol abuse  Date Unknown Arterial hypotension  Date Unknown Arthritis  Date Unknown Depression   Date Unknown Gait instability  Date Unknown History of dehydration  Date Unknown History of kidney stones  Date Unknown Hyperlipidemia  Date Unknown Memory loss  Date Unknown Microscopic colitis  Date Unknown Migraine   Date Unknown Multiple falls  Date Unknown Osteopenia  Date Unknown Urinary incontinence due to cognitive impairment   Problem List   New problems from outside sources are available for reconciliation  Cardiovascular and Mediastinum  Arterial hypotension   Syncope and collapse   Vasovagal syncope   Nervous and Auditory  Late onset Alzheimer's disease with behavioral disturbance   Delirium due to medical condition with behavioral disturbance   Musculoskeletal and Integument  OA (osteoarthritis) of knee   Other  Major depression in remission (HCC)   Hyperlipidemia   Gait instability   History of dehydration   Dehydration, moderate   Urinary incontinence due to cognitive impairment   Neurodegenerative gait disorder   Mark as Reviewed   Surgical History    01/05/2018 Total knee arthroplasty (Left)   07/01/2017 Colonoscopy  Date Unknown Dilation and curettage of uterus   Date Unknown Joint  replacement   Date Unknown Tonsillectomy  Date Unknown Total knee arthroplasty (Right    Medications    alendronate (FOSAMAX) 70 MG tablet    atorvastatin (LIPITOR) 40 MG tablet    cholecalciferol (VITAMIN D) 1000 units tablet    citalopram (CELEXA) 20 MG tablet    donepezil (ARICEPT) 10 MG tablet    fludrocortisone (FLORINEF) 0.1 MG tablet    memantine (NAMENDA XR) 28 MG CP24 24 hr capsule    mirabegron ER (MYRBETRIQ) 50 MG TB24 tablet    vitamin B-12 (CYANOCOBALAMIN) 100 MCG tablet    HYDROcodone-acetaminophen (NORCO/VICODIN) 5-325 MG tablet    methocarbamol (ROBAXIN) 500 MG tablet    tamsulosin (FLOMAX) 0.4 MG CAPS capsule    traMADol (ULTRAM) 50 MG tablet    Allergies     Other   Tobacco History   Smoking Status  Never Smoker  Smokeless Tobacco Status  Never Used      Family History   Mother (Deceased at age 78) Anemia    Alzheimer's disease    Emphysema          Sister Obesity    Emphysema          Brother Coronary artery disease     Heart attack         Daughter Obesity    Depression         Son Alcohol abuse    Depression    Suicidality          Father (Deceased at age 78)         Maternal Grandmother (Deceased at age 78)         Maternal Grandfather (Deceased  at age 78)         Paternal Grandmother (Deceased at age 78's)         Paternal Grandfather (Deceased at age 78's)            Neg Hx Colon cancer    Esophageal cancer    Rectal cancer    Stomach cancer           ROS: Per HPI unless specifically indicated in ROS section   Objective: Vitals:   04/03/18 1356  BP: 122/67  Pulse: 71   Vascular Examination: Capillary refill time immediate  x 10 digits Dorsalis pedis and posterior tibial pulses present b/l No digital hair x 10 digits Skin temperature warm to warm b/l  Dermatological Examination: Skin thin and atrophic b/l Toenails 1-5 b/l discolored, thick, dystrophic with subungual debris and pain with palpation to  nailbeds due to thickness of nails. Left 4th digit noted to be severely incurvated indicating pincer nail deformity  Musculoskeletal: Muscle strength 5/5 to all LE muscle groups HAV b/l LE  Neurological: UTA sensation due to cognition, but she does respond to external noxious stimuli b/l  Assessment: Painful onychomycosis toenails 1-5 b/l  Pincer nail left 4th digit  Plan: 1. Toenails 1-5 b/l were debrided in length and girth. Light bleeding left 5th digit addressed with Lumicaine hemostatic solution. Triple antibiotic ointment applied. No further treatment required by patient. 2. Patient to continue soft, supportive shoe gear 3. Patient to report any pedal injuries to medical professional immediately. 4. Follow up 3 months.  5. Patient/POA to call should there be a concern in the interim.

## 2018-04-15 ENCOUNTER — Encounter: Payer: Self-pay | Admitting: Neurology

## 2018-04-22 DIAGNOSIS — N3944 Nocturnal enuresis: Secondary | ICD-10-CM | POA: Diagnosis not present

## 2018-04-22 DIAGNOSIS — Z87442 Personal history of urinary calculi: Secondary | ICD-10-CM | POA: Diagnosis not present

## 2018-04-25 DIAGNOSIS — Z471 Aftercare following joint replacement surgery: Secondary | ICD-10-CM | POA: Diagnosis not present

## 2018-04-30 DIAGNOSIS — K219 Gastro-esophageal reflux disease without esophagitis: Secondary | ICD-10-CM | POA: Diagnosis not present

## 2018-04-30 DIAGNOSIS — F0390 Unspecified dementia without behavioral disturbance: Secondary | ICD-10-CM | POA: Diagnosis not present

## 2018-04-30 DIAGNOSIS — F321 Major depressive disorder, single episode, moderate: Secondary | ICD-10-CM | POA: Diagnosis not present

## 2018-04-30 DIAGNOSIS — E7800 Pure hypercholesterolemia, unspecified: Secondary | ICD-10-CM | POA: Diagnosis not present

## 2018-04-30 DIAGNOSIS — E538 Deficiency of other specified B group vitamins: Secondary | ICD-10-CM | POA: Diagnosis not present

## 2018-05-05 DIAGNOSIS — L821 Other seborrheic keratosis: Secondary | ICD-10-CM | POA: Diagnosis not present

## 2018-05-25 DIAGNOSIS — Z471 Aftercare following joint replacement surgery: Secondary | ICD-10-CM | POA: Diagnosis not present

## 2018-06-22 DIAGNOSIS — Z23 Encounter for immunization: Secondary | ICD-10-CM | POA: Diagnosis not present

## 2018-06-25 DIAGNOSIS — Z471 Aftercare following joint replacement surgery: Secondary | ICD-10-CM | POA: Diagnosis not present

## 2018-07-03 ENCOUNTER — Ambulatory Visit: Payer: Medicare Other | Admitting: Podiatry

## 2018-07-08 ENCOUNTER — Encounter: Payer: Self-pay | Admitting: Podiatry

## 2018-07-08 ENCOUNTER — Ambulatory Visit (INDEPENDENT_AMBULATORY_CARE_PROVIDER_SITE_OTHER): Payer: Medicare Other | Admitting: Podiatry

## 2018-07-08 DIAGNOSIS — M79674 Pain in right toe(s): Secondary | ICD-10-CM | POA: Diagnosis not present

## 2018-07-08 DIAGNOSIS — M79675 Pain in left toe(s): Secondary | ICD-10-CM

## 2018-07-08 DIAGNOSIS — B351 Tinea unguium: Secondary | ICD-10-CM | POA: Diagnosis not present

## 2018-07-08 NOTE — Patient Instructions (Signed)

## 2018-07-08 NOTE — Progress Notes (Signed)
Subjective: Kim PutnamBlanche C Behan presents to clinic today accompanied by her husband.  She has dementia and husband states she does not like going to any of her doctor's visit. Husband states toenails are long and painful, and discolored   Objective: There were no vitals filed for this visit. Vascular Examination: Capillary refill time immediate  x 10 digits Dorsalis pedis and posterior tibial pulses present b/l No digital hair x 10 digits Skin temperature gradient WNL b/l  Dermatological Examination: Skin thin and atrophic b/l Toenails 1-5 b/l discolored, thick, dystrophic with subungual debris and pain with palpation to nailbeds due to thickness of nails. Pincer nail deformity left 4th digit.  Musculoskeletal: Muscle strength 5/5 to all LE muscle groups HAV b/l  Neurological: UTA due to cognition  Assessment: Painful onychomycosis toenails 1-5 b/l   Plan: 1. Toenails 1-5 b/l were debrided in length and girth without iatrogenic bleeding. 2. Patient to continue soft, supportive shoe gear 3. Patient to report any pedal injuries to medical professional immediately. 4. Follow up 4 months per husband's request. Patient/POA to call should there be a concern in the interim.

## 2018-07-25 DIAGNOSIS — Z471 Aftercare following joint replacement surgery: Secondary | ICD-10-CM | POA: Diagnosis not present

## 2018-08-05 ENCOUNTER — Telehealth: Payer: Self-pay | Admitting: Gastroenterology

## 2018-08-05 NOTE — Telephone Encounter (Signed)
No answer. Left message to call us again. Hx of lymphocytic colitis. Last office visit 10/06/17

## 2018-08-06 NOTE — Telephone Encounter (Signed)
Patient has dementia. She has been incontinent of bladder for " some time." She has developed incontinence of the bowels in the past months. The spouse reports she is soiling her adult briefs during the night and sometimes during the day. He describes the bowel movements as loose and unformed. She has normal appetite and does not complain of abdominal pain. She is not having more than 3 bowel movements a day. He will try a dose of Imodium. Monitor bowel movements consistency and frequency closely to avoid creating constipation. Encouraged to call us if this issue continues. Is this okay for now?

## 2018-08-06 NOTE — Telephone Encounter (Signed)
Thanks Beth, I think it is fine to start with immodium. If that is not working, would then try a course of budesonide, as she has a history of lymphocytic colitis and has responded well to therapy for that in the past. Thanks

## 2018-08-25 DIAGNOSIS — Z471 Aftercare following joint replacement surgery: Secondary | ICD-10-CM | POA: Diagnosis not present

## 2018-09-16 ENCOUNTER — Telehealth: Payer: Self-pay | Admitting: Neurology

## 2018-09-16 NOTE — Telephone Encounter (Signed)
I have completed the form and waiting on Dr Vickey Huger to sign then I will call the husband and make him aware when I place in mail.

## 2018-09-16 NOTE — Telephone Encounter (Signed)
Called the husband, no answer. LVM informing the pt's husband that I have completed the paperwork and placed in the mail for them.

## 2018-09-16 NOTE — Telephone Encounter (Signed)
Pts husband Loistine Chance (on [place]) would like to know if provider is able to fill out a form and mail it to him for the pt to have the handicap sticker provided for her. Please advise.

## 2018-09-25 DIAGNOSIS — Z471 Aftercare following joint replacement surgery: Secondary | ICD-10-CM | POA: Diagnosis not present

## 2018-10-01 DIAGNOSIS — Z0120 Encounter for dental examination and cleaning without abnormal findings: Secondary | ICD-10-CM | POA: Diagnosis not present

## 2018-10-06 ENCOUNTER — Encounter: Payer: Self-pay | Admitting: Neurology

## 2018-10-06 ENCOUNTER — Ambulatory Visit: Payer: Medicare Other | Admitting: Neurology

## 2018-10-06 VITALS — Ht 63.0 in | Wt 135.0 lb

## 2018-10-06 DIAGNOSIS — F341 Dysthymic disorder: Secondary | ICD-10-CM

## 2018-10-06 DIAGNOSIS — F0281 Dementia in other diseases classified elsewhere with behavioral disturbance: Secondary | ICD-10-CM | POA: Diagnosis not present

## 2018-10-06 DIAGNOSIS — F325 Major depressive disorder, single episode, in full remission: Secondary | ICD-10-CM | POA: Diagnosis not present

## 2018-10-06 DIAGNOSIS — G301 Alzheimer's disease with late onset: Secondary | ICD-10-CM

## 2018-10-06 DIAGNOSIS — F02818 Dementia in other diseases classified elsewhere, unspecified severity, with other behavioral disturbance: Secondary | ICD-10-CM

## 2018-10-06 MED ORDER — CITALOPRAM HYDROBROMIDE 20 MG PO TABS
20.0000 mg | ORAL_TABLET | Freq: Every evening | ORAL | 5 refills | Status: AC
Start: 2018-10-06 — End: ?

## 2018-10-06 NOTE — Addendum Note (Signed)
Addended by: Melvyn Novas on: 10/06/2018 03:10 PM   Modules accepted: Orders

## 2018-10-06 NOTE — Progress Notes (Addendum)
[facility]  Provider:  Dr Adrion Menz Referring Provider: [name], MD Primary Care Physician:  [name], MD  Chief Complaint  Patient presents with  . Follow-up    Rm 11, husband  . Dementia    MMSE 8/30    HPI:   10-06-2018, Rv with Mr and Mrs. Debella, my 78 year old patient with progressive dementia. More falls.  Significant changes, MMSE was 8 points.  There is now barely fluent speech, walking is unsteady and she is incontinent of urine and stool. She is quite upset as her husband Abbe Amsterdam states his observations. He also reports she sleeps well and a has a good appetite.      03-31-2018, Rv with Briant Sites, 78 year old caucasian female member of the [place] witnesses and status post left knee replacement surgery on 01-05-2018. She still has a tendency to fall but is using her walker now. That protects her well. Dr. Synthia Innocent had a Rv with her last week and she was doing very well. Her speech is more haltering and she arrests mid sentence , her husband noted increased word finding delay. [facility] for 14 days was including PT and speech therapy. She was  there confused at night and often had a nurse to call her husband to pick her up - STAT. Her knee still hurts a bit but she is much improved.    12-24-2017,She has had more falls, 5 or 6 in total. Not related to fainting, no injuries resulted. She sleeps well. Her appetite is good.  Her speech is less clear and less formed. A lisp? She also has less vocabulary. She cannot find joy in reading as she forgets the previous pages. She reports good ability to smell and taste. She walks with a walker, awaiting knee replacement.     CAROLAN AVEDISIAN is a 78 y.o. female , who has visited the ER twice for syncope, dehydration.  [name] presents for the first time today with slight dysarthria, no dysphonia.  There is a slight lisp that I had never noticed before.  Her husband states that her ER visits  were related to dehydration and that he has a hard time keeping her well-hydrated and reminding her to drink.  Today's memory test shows a decline at 9 out of 30 points.  She had trouble with calculation which was not difficult for her in the past, but she also is not completely oriented to date and place.  Short-term memory is the most affected.  She is able to follow to step commands.  She is able to name objects.   MMSE - Mini Mental State Exam 10/06/2018 03/31/2018 12/24/2017 06/26/2017 03/26/2017 09/25/2016 03/27/2016  Orientation to time 0 1 1 0 _0 Orientation to Place _1 Registration _2 Attention/ Calculation 0 0 1 0 2 0 5  Recall 0 0 0 0 0 0 0  Language- name 2 objects 0 _3 Language- repeat 0 _4 Language- follow 3 step command _5 Language- read & follow direction _6 Write a sentence 0 0 - 0 _7 Copy design 0 0 0 0 0 1 0  Total score 8 12 - _8 RV from 03-26-17 ,seen  here as a revisit  from Dr. Osborne Casco for memory loss. Interval history from 03/26/2017, I have followed Mrs. Gavia by now for 9 years. Her clinical course and imaging studies were consistent with a diagnosis of Alzheimer's disease and the patient has been aware of his diagnosis. She underwent a PET scan before. Over the last 18 months to have been a more advancing progression of memory loss. The couple just celebrated their 60th anniversary in May 2018. Unfortunately, Mrs. Lora felt ill in early spring and had to be hospitalized. She had developed diarrhea, dehydration and fainted. She was hospitalized at [facility] Status Examination revealed 16/30 points, she was good with animal naming she had some trouble with a clock drawing. Her husband noted a progressive deterioration , physically she is weaker , too. She was mowing the lawn before, now she has difficulties to get in and out of the car. She will need PT now.    HISTORY:   Mrs. Double is a right-handed, married Caucasian female patient presented first in early 2009 for memory evaluations,  Her clinical course and her imaging studies are consistent with a diagnosis of Alzheimer's disease. The patient has always been aware of the diagnosis.She started on Aricept and reported no side effects from the medication. She reports no sundowning,  no change in weight.  no change in smell or taste,  has a good appetite and is socially active as well as physically active.Mrs. Schweppe is able to orient herself in time frame throughout the day, knows when its morshe knows the date, but today stated it is Mob nday, when it was actually Friday.  She remains completely independent in her daily life with all activities of daily living. She did get lost twice  and therefore stopped driving in 0141 .   In May 2012  28/30 the patient underwent a PET scan which showed the evidence the distribution of angles indicative of Alzheimer's -her short-term memory is still the only deficit. sometime she had trouble naming people or misplacing things,on a Mini-Mental test in November 2012 her Mini-Mental Status Examination was 25 points and her animal fluency test 21 points In August 2014 her Mini-Mental Status Examination was 22 and animal fluency test 20 be also added a MOCA , in which  she did very well- with the trail making and visual spatial demands of that test, and  to 24 words. She was quite frustrated with a five-minute recall failure.  "I am still playing Scrabble but I just don't win anymore " - Today's geriatric depression score was endorsed at 4 points, and the patient's Mini-Mental Status Examination revealed 19 out of 30 points. Her main problem is the immediate recall of 3 words and she also had some trouble counting backwards by sevens. No is a disorientation to season year day of the week. She was aware of the month of August.  Social history : Mrs Crittendon married at age 85 , met her  husband at age 22. The couple has  2 children,  Born at maternal age  51 and age 55.      MMSE - Mini Mental State Exam 10/06/2018 03/31/2018 12/24/2017 06/26/2017 03/26/2017 09/25/2016 03/27/2016  Orientation to time 0 1 1 0 _0 Orientation to Place _1 Registration _2 Attention/ Calculation 0 0 1 0 2 0 5  Recall 0 0 0 0 0 0 0  Language- name 2 objects 0 _0 Language- repeat 0 _1 Language- follow 3 step command _2 Language- read & follow direction _3 Write a sentence 0 0 - 0 _4 Copy design 0 0 0 0 0 1 0  Total score 8 12 - _5 Result Notes     Notes Recorded by Larey Seat, MD on 11/07/2014 at 9:11 AM Brain atrophy in the area of memory centers, the mesio-temporal lobes. Patient already received this message in a personal phone call. CD          Vitals     Height Weight BMI (Calculated)    _6  (1.575 m) 130 lb (58.968 kg) 23.8      Interpretation Summary        Review of Systems: Out of a complete 14 system review, the patient complains of only the following symptoms, and all other reviewed systems are negative. "Getting forgetful" sense of doom, anxiety , depression.  Falls . Has severe memory loss- incontinence. Gait instability.   Family History  Problem Relation Age of Onset  . Anemia Mother   . Alzheimer's disease Mother   . Emphysema Mother        smoker  . Obesity Sister   . Emphysema Sister        smoker  . Coronary artery disease Brother        smoker  . Heart attack Brother   . Obesity Daughter   . Depression Daughter   . Alcohol abuse Son   . Depression Son   . Suicidality Son        10/12-commited suicide in a manic depressive episode  . Colon cancer Neg Hx   . Esophageal cancer Neg Hx   . Rectal cancer Neg Hx   . Stomach cancer Neg Hx     Past Medical History:  Diagnosis Date  . Alcohol abuse   . Alzheimer's disease  ([place]) 05/23/2010  . ANXIETY DEPRESSION 05/23/2010  . Arterial hypotension   . Arthritis   . DEMENTIA 07/20/2008  . Depression    monopolar  . Gait instability   . GERD 05/11/2007   ?  Marland Kitchen History of dehydration   . History of kidney stones   . Hyperlipidemia   . Memory loss   . Microscopic colitis   . Migraine    pt denies  . Multiple falls   . OSTEOARTHRITIS, KNEE 05/23/2010  . Osteopenia   . OSTEOPOROSIS 12/17/2007  . Urinary incontinence due to cognitive impairment   . Vasovagal syncope 06/26/2017  . Wrist pain, right 04/17/2011    Past Surgical History:  Procedure Laterality Date  . COLONOSCOPY  07/01/2017  . DILATION AND CURETTAGE OF UTERUS     forty years ago  . JOINT REPLACEMENT     Left total knee  Dr. Wynelle Link 01-05-18  . TONSILLECTOMY    . TOTAL KNEE ARTHROPLASTY Right   . TOTAL KNEE ARTHROPLASTY Left 01/05/2018   Procedure: LEFT TOTAL KNEE ARTHROPLASTY;  Surgeon: Gaynelle Arabian, MD;  Location: [facility];  Service: Orthopedics;  Laterality: Left;  with block    Current Outpatient Medications  Medication Sig Dispense Refill  . alendronate (FOSAMAX) 70 MG  tablet Take 70 mg by mouth every Wednesday. Take with a full glass of water on an empty stomach.    Marland Kitchen atorvastatin (LIPITOR) 40 MG tablet TAKE 1 TABLET(40 MG) BY MOUTH DAILY (Patient taking differently: TAKE 1 TABLET(40 MG) BY MOUTH DAILY IN THE EVENING) 90 tablet 0  . citalopram (CELEXA) 20 MG tablet Take 1 tablet (20 mg total) by mouth daily. (Patient taking differently: Take 20 mg by mouth every evening. ) 90 tablet 3  . donepezil (ARICEPT) 10 MG tablet Take 1 tablet (10 mg total) by mouth every evening. 90 tablet 1  . fludrocortisone (FLORINEF) 0.1 MG tablet take 0.39m by mouth daily in the evening  5  . memantine (NAMENDA XR) 28 MG CP24 24 hr capsule TAKE 1 CAPSULE(28 MG) BY MOUTH DAILY IN THE EVENING 90 capsule 2  . vitamin B-12 (CYANOCOBALAMIN) 100 MCG tablet Take 100 mcg by mouth every evening.     No current  facility-administered medications for this visit.     Allergies as of 10/06/2018 - Review Complete 10/06/2018  Allergen Reaction Noted  . Other  01/02/2018    Vitals: Ht _0  (1.6 m)   Wt 135 lb (61.2 kg)   BMI 23.91 kg/m  Last Weight:  Wt Readings from Last 1 Encounters:  10/06/18 135 lb (61.2 kg)   Last Height:   Ht Readings from Last 1 Encounters:  10/06/18 _1  (1.6 m)     Physical exam:  General: The patient is awake, alert and appears not in acute distress.  The patient is no longer well groomed, smells of urine.  Head: Normocephalic, atraumatic.  Neck is supple. Mallampati 2, neck circumference: 13.5 ,  Cardiovascular:  Regular rate and rhythm, without  murmurs or carotid bruit, and without distended neck veins. Respiratory: Lungs are clear to auscultation. Skin:  Without evidence of edema, or rash-  BMI is 22.8  Neurologic exam : The patient is awake and alert,  But not longer fully oriented to place and time.   Memory subjective described as impaired , but the patient appears happy and outgoing, she has not longer a fluent speech pattern, and she often looks to her husband to answer questions. .Marland Kitchen  Speech is fluent without dysarthria, but dysphonia ,  Aphasia.  Mood and affect are defiant - she is not happy to be here.  She is "feisty".    Cranial nerves:  She reports no changes in sense of smell or taste.Her appetite is good.  Pupils are equal and briskly reactive to light. Facial sensation intact to fine touch.  Facial motor strength is symmetric and tongue and uvula move midline. Motor exam:  Increased tone, gegenhalten , unable to relax.  She is unable to follow multi step commands.  No parkinsonism.  Finger-to-nose maneuver with dysmetria, tremor on the left side.  More rigor on the left ,  pronator drift, weakness of grip strength.  Gait and station: difficulties to rise from a seated position.  Needs an armchair with high arm rests.  Patient walks with  a walker. She was able to walk with me, cautiously and she has been unable to turn, is insecure, fears to fall.  She needed almost 7 small steps to turn. she denies knee pain. She  held on to the door frame before turning. She is slightly unsteady and drifted more to the left.  Deep tendon reflexes: All still symmetric - patella reflex deferred.  Babinski maneuver deferred- unable to test, extremely ticklish !  Assessment:  After physical and neurologic examination, review of laboratory studies, imaging, neurophysiology testing.  25 minute visit with more than 50% of the face to face time dedicated to a neurodegenerative disorder associated with a slow progression of memory decline -she is still sleeping well. She has reached a progressed d stage of Alzheimer's disease.    At this time there is no longer need for nursing at home - she could benefit from participation in group exercises. She has now a cleaner, but forgot.  Her husband feels he is able to care still alone for her- declined offers of home healthcare or housekeeping support.     She will continue SSRI but we decided to D/C the Namenda as the costs are very high and the benefits now limited.  She will use melatonin if her sleep changes.  I would like to ask for social work consult to arrnage better in home care/ Daughter Morey Hummingbird is willing to drive to appointments, shopping , etc.   Follow up in 6 month with NP/ MD - 30 minutes, MMSE    Larey Seat, MD  [facility] PCP:  Dr Domenick Gong.

## 2018-10-06 NOTE — Patient Instructions (Signed)
Alzheimer Disease Caregiver Guide    Alzheimer disease causes a person to lose the ability to remember things and make decisions. A person who has Alzheimer disease may not be able to take care of himself or herself. He or she may need help with simple tasks. The tips below can help you care for the person.  What kind of changes does this condition cause?  This condition makes a person:   Forget things.   Feel confused.   Act differently.   Have different moods.  These things get worse with time.  Tips to help with symptoms   Be calm and patient.   Respond with a simple, short answer.   Avoid correcting the person in a negative way.   Try not to take things personally, even if the person forgets your name.   Do not argue with the person. This may make the person more upset.  Tips to lessen frustration   Make appointments and do daily tasks when the person is at his or her best.   Take your time. Simple tasks may take longer. Allow plenty of time to complete tasks.   Limit choices for the person.   Involve the person in what you are doing.   Keep a daily routine.   Avoid new or crowded places, if possible.   Use simple words, short sentences, and a calm voice. Only give one direction at a time.   Buy clothes and shoes that are easy to put on and take off.   Organize medicines in a pillbox for each day of the week.   Keep a calendar in a central location to remind the person of meetings or other activities.   Let people help if they offer. Take a break when needed.  Tips to prevent injury   Keep floors clear. Remove rugs, magazine racks, and floor lamps.   Keep hallways well-lit.   Put a handrail and non-slip mat in the bathtub or shower.   Put childproof locks on cabinets that have dangerous items in them. These items include medicine, alcohol, guns, toxic cleaning items, sharp tools, matches, and lighters.   Put locks on doors where the person cannot see or reach them. This helps the person  to not wander out of the house and get lost.   Be prepared for emergencies. Keep a list of emergency phone numbers and addresses close by.   Bracelets may be worn that track location and identify the person as having memory problems. This should be worn at all times for safety.  Tips for the future   Discuss financial and legal planning early. People with this disease have trouble managing their money as the disease gets worse. Get help from a professional.   Talk about advance directives, safety, and daily care. Take these steps:  ? Create a living will and choose a power of attorney. This is someone who can make decisions for the person with Alzheimer disease when he or she can no longer do so.  ? Discuss driving safety and when to stop driving. The person's doctor can help with this.  ? If the person lives alone, make sure he or she is safe. Some people need extra help at home. Other people need more care at a nursing home or care center.  Where to find support  You can find support by joining a support group near you. Some benefits of joining a support group include:   Learning ways to manage stress.     Sharing experiences with others.   Getting emotional comfort and support.   Learning about caregiving as the disease progresses.   Knowing what community resources are available and making use of them.  Where to find more information   Alzheimer's Association: www.alz.org  Contact a doctor if:   The person has a fever.   The person has a sudden behavior change that does not get better with calming strategies.   The person is not able to take care of himself or herself at home.   The person threatens you or anyone else, including himself or herself.   You are no longer able to care for the person.  Summary   Alzheimer disease causes a person to forget things and to be confused.   A person who has this condition may not be able to take care of himself or herself.   Take steps to keep the person  from getting hurt. Plan for future care.   You can find support by joining a support group near you.  This information is not intended to replace advice given to you by your health care provider. Make sure you discuss any questions you have with your health care provider.  Document Released: 10/28/2011 Document Revised: 07/31/2017 Document Reviewed: 07/31/2017  Elsevier Interactive Patient Education  2019 [employer]

## 2018-10-21 DIAGNOSIS — R82998 Other abnormal findings in urine: Secondary | ICD-10-CM | POA: Diagnosis not present

## 2018-10-21 DIAGNOSIS — E7800 Pure hypercholesterolemia, unspecified: Secondary | ICD-10-CM | POA: Diagnosis not present

## 2018-10-21 DIAGNOSIS — M810 Age-related osteoporosis without current pathological fracture: Secondary | ICD-10-CM | POA: Diagnosis not present

## 2018-10-21 DIAGNOSIS — E538 Deficiency of other specified B group vitamins: Secondary | ICD-10-CM | POA: Diagnosis not present

## 2018-10-24 DIAGNOSIS — Z471 Aftercare following joint replacement surgery: Secondary | ICD-10-CM | POA: Diagnosis not present

## 2018-10-28 DIAGNOSIS — Z0000 Encounter for general adult medical examination without abnormal findings: Secondary | ICD-10-CM | POA: Diagnosis not present

## 2018-10-28 DIAGNOSIS — E7800 Pure hypercholesterolemia, unspecified: Secondary | ICD-10-CM | POA: Diagnosis not present

## 2018-10-28 DIAGNOSIS — F0390 Unspecified dementia without behavioral disturbance: Secondary | ICD-10-CM | POA: Diagnosis not present

## 2018-10-28 DIAGNOSIS — F321 Major depressive disorder, single episode, moderate: Secondary | ICD-10-CM | POA: Diagnosis not present

## 2018-11-01 ENCOUNTER — Emergency Department (HOSPITAL_COMMUNITY): Payer: Medicare Other

## 2018-11-01 ENCOUNTER — Emergency Department (HOSPITAL_COMMUNITY)
Admission: EM | Admit: 2018-11-01 | Discharge: 2018-11-01 | Disposition: A | Discharge status: Home / Self Care | Payer: Medicare Other | Attending: Emergency Medicine | Admitting: Emergency Medicine

## 2018-11-01 ENCOUNTER — Other Ambulatory Visit: Payer: Self-pay

## 2018-11-01 ENCOUNTER — Encounter (HOSPITAL_COMMUNITY): Payer: Self-pay

## 2018-11-01 DIAGNOSIS — R4182 Altered mental status, unspecified: Secondary | ICD-10-CM | POA: Diagnosis present

## 2018-11-01 DIAGNOSIS — Z79899 Other long term (current) drug therapy: Secondary | ICD-10-CM | POA: Diagnosis not present

## 2018-11-01 DIAGNOSIS — F0390 Unspecified dementia without behavioral disturbance: Secondary | ICD-10-CM | POA: Diagnosis not present

## 2018-11-01 DIAGNOSIS — Z96652 Presence of left artificial knee joint: Secondary | ICD-10-CM | POA: Diagnosis not present

## 2018-11-01 DIAGNOSIS — R410 Disorientation, unspecified: Secondary | ICD-10-CM | POA: Diagnosis not present

## 2018-11-01 DIAGNOSIS — G309 Alzheimer's disease, unspecified: Secondary | ICD-10-CM | POA: Insufficient documentation

## 2018-11-01 DIAGNOSIS — E860 Dehydration: Secondary | ICD-10-CM | POA: Insufficient documentation

## 2018-11-01 DIAGNOSIS — R630 Anorexia: Secondary | ICD-10-CM

## 2018-11-01 DIAGNOSIS — J9811 Atelectasis: Secondary | ICD-10-CM | POA: Diagnosis not present

## 2018-11-01 LAB — CBC WITH DIFFERENTIAL/PLATELET
Abs Immature Granulocytes: 0.02 10*3/uL (ref 0.00–0.07)
BASOS ABS: 0 10*3/uL (ref 0.0–0.1)
Basophils Relative: 0 %
EOS ABS: 0.3 10*3/uL (ref 0.0–0.5)
EOS PCT: 6 %
HEMATOCRIT: 42.4 % (ref 36.0–46.0)
Hemoglobin: 13.3 g/dL (ref 12.0–15.0)
Immature Granulocytes: 1 %
LYMPHS ABS: 0.4 10*3/uL — AB (ref 0.7–4.0)
Lymphocytes Relative: 10 %
MCH: 30.4 pg (ref 26.0–34.0)
MCHC: 31.4 g/dL (ref 30.0–36.0)
MCV: 96.8 fL (ref 80.0–100.0)
Monocytes Absolute: 0.6 10*3/uL (ref 0.1–1.0)
Monocytes Relative: 13 %
NEUTROS ABS: 2.9 10*3/uL (ref 1.7–7.7)
NEUTROS PCT: 70 %
Platelets: 148 10*3/uL — ABNORMAL LOW (ref 150–400)
RBC: 4.38 MIL/uL (ref 3.87–5.11)
RDW: 11.9 % (ref 11.5–15.5)
WBC: 4.2 10*3/uL (ref 4.0–10.5)
nRBC: 0 % (ref 0.0–0.2)

## 2018-11-01 LAB — COMPREHENSIVE METABOLIC PANEL
ALBUMIN: 3.9 g/dL (ref 3.5–5.0)
ALT: 18 U/L (ref 0–44)
ANION GAP: 8 (ref 5–15)
AST: 22 U/L (ref 15–41)
Alkaline Phosphatase: 90 U/L (ref 38–126)
BUN: 9 mg/dL (ref 8–23)
CO2: 26 mmol/L (ref 22–32)
Calcium: 8.9 mg/dL (ref 8.9–10.3)
Chloride: 99 mmol/L (ref 98–111)
Creatinine, Ser: 0.73 mg/dL (ref 0.44–1.00)
GFR calc Af Amer: >60 mL/min (ref >60)
GFR calc non Af Amer: >60 mL/min (ref >60)
GLUCOSE: 94 mg/dL (ref 70–99)
POTASSIUM: 3.8 mmol/L (ref 3.5–5.1)
Sodium: 133 mmol/L — ABNORMAL LOW (ref 135–145)
Total Bilirubin: 0.7 mg/dL (ref 0.3–1.2)
Total Protein: 7.2 g/dL (ref 6.5–8.1)

## 2018-11-01 LAB — LACTIC ACID, PLASMA: LACTIC ACID, VENOUS: 1 mmol/L (ref 0.5–1.9)

## 2018-11-01 LAB — URINALYSIS, ROUTINE W REFLEX MICROSCOPIC
Bilirubin Urine: NEGATIVE
Glucose, UA: NEGATIVE mg/dL
Hgb urine dipstick: NEGATIVE
Ketones, ur: 5 mg/dL — AB
Leukocytes,Ua: NEGATIVE
NITRITE: NEGATIVE
Protein, ur: NEGATIVE mg/dL
Specific Gravity, Urine: 1.015 (ref 1.005–1.030)
pH: 7 (ref 5.0–8.0)

## 2018-11-01 LAB — I-STAT TROPONIN, ED: TROPONIN I, POC: 0 ng/mL (ref 0.00–0.08)

## 2018-11-01 MED ORDER — ONDANSETRON 4 MG PO TBDP: 4.0000 mg | ORAL_TABLET | Freq: Three times a day (TID) | ORAL | 0 refills | Status: AC | PRN

## 2018-11-01 MED ORDER — SODIUM CHLORIDE 0.9 % IV BOLUS
1000.0000 mL | Freq: Once | INTRAVENOUS | Status: AC
  Administered 2018-11-01: 1000 mL via INTRAVENOUS

## 2018-11-01 NOTE — ED Notes (Signed)
Pt given turkey sandwich and water

## 2018-11-01 NOTE — ED Notes (Signed)
Patient transported to X-ray 

## 2018-11-01 NOTE — ED Notes (Addendum)
[phone] Loft

## 2018-11-01 NOTE — ED Triage Notes (Signed)
Pt is currently on ABX for UTI.  Husband and daughter state today pt has been more confused than normal.  Normally pt is alert to husband and knows she is at home.  Sometimes she knows other people and at times does not.  Does not know date/time, situation or date of birth.  Pt uses walker and wheelchair.  Pt had to have more assist getting to car using the walker.  No cough/cold symptoms.

## 2018-11-01 NOTE — ED Provider Notes (Signed)
[facility] Provider Note   CSN: [id number] Arrival date & time: 11/01/18  1437    History   Chief Complaint Chief Complaint  Patient presents with  . Altered Mental Status    HPI Kim Hernandez is a 78 y.o. female.     HPI]  Presents with AMS Bactrim after diagnosed with UTI on Monday Husband reports last night to this AM developed altered mental status, confusion. Asked her daughter where her mom was, begins talking and trails off Reported lightheadedness Today decreased po intake, 2 cups of water No n/v/diarrhea No other medication changes Prior admissions for dehdyration and husband thinks she may be dehydrated No headache, vision changes, no fever, cough/cold, denies dysuria No dyspnea or CP No sick contacts At baseline is oriented to husband, sometimes is oriented to others but not always. Uses wheelchair given chronic unsteadiness/decline and then walker to get to car but today needed a lot more assistance than usual. Generalized weakness and fatigue  Past Medical History:  Diagnosis Date  . Alcohol abuse   . Alzheimer's disease (HCC) 05/23/2010  . ANXIETY DEPRESSION 05/23/2010  . Arterial hypotension   . Arthritis   . DEMENTIA 07/20/2008  . Depression    monopolar  . Gait instability   . GERD 05/11/2007   ?  Marland Kitchen. History of dehydration   . History of kidney stones   . Hyperlipidemia   . Memory loss   . Microscopic colitis   . Migraine    pt denies  . Multiple falls   . OSTEOARTHRITIS, KNEE 05/23/2010  . Osteopenia   . OSTEOPOROSIS 12/17/2007  . Urinary incontinence due to cognitive impairment   . Vasovagal syncope 06/26/2017  . Wrist pain, right 04/17/2011    Patient Active Problem List   Diagnosis Date Noted  . Delirium due to medical condition with behavioral disturbance 03/31/2018  . Neurodegenerative gait disorder 03/31/2018  . Aftercare 02/10/2018  . History of total knee replacement, left 02/10/2018  .  OA (osteoarthritis) of knee 01/05/2018  . Vasovagal syncope 06/26/2017  . Dehydration, moderate 06/26/2017  . Urinary incontinence due to cognitive impairment 06/26/2017  . History of dehydration 03/26/2017  . Arterial hypotension 03/26/2017  . Syncope and collapse 03/26/2017  . Gait instability 11/13/2016  . Hyperlipidemia 04/17/2011  . Major depression in remission (HCC) 05/23/2010  . Late onset Alzheimer's disease with behavioral disturbance (HCC) 05/23/2010    Past Surgical History:  Procedure Laterality Date  . COLONOSCOPY  07/01/2017  . DILATION AND CURETTAGE OF UTERUS     forty years ago  . JOINT REPLACEMENT     Left total knee  Dr. Lequita HaltAluisio 01-05-18  . TONSILLECTOMY    . TOTAL KNEE ARTHROPLASTY Right   . TOTAL KNEE ARTHROPLASTY Left 01/05/2018   Procedure: LEFT TOTAL KNEE ARTHROPLASTY;  Surgeon: Ollen GrossAluisio, Frank, MD;  Location: [facility];  Service: Orthopedics;  Laterality: Left;  with block     OB History   No obstetric history on file.      Home Medications    Prior to Admission medications   Medication Sig Start Date End Date Taking? Authorizing Provider  alendronate (FOSAMAX) 70 MG tablet Take 70 mg by mouth every Wednesday. Take with a full glass of water on an empty stomach.    [provider]  atorvastatin (LIPITOR) 40 MG tablet TAKE 1 TABLET(40 MG) BY MOUTH DAILY Patient taking differently: TAKE 1 TABLET(40 MG) BY MOUTH DAILY IN THE EVENING 10/28/16   Crawford,  Austin Miles, MD  citalopram (CELEXA) 20 MG tablet Take 1 tablet (20 mg total) by mouth every evening. 10/06/18   Dohmeier, Porfirio Mylar, MD  donepezil (ARICEPT) 10 MG tablet Take 1 tablet (10 mg total) by mouth every evening. 03/31/18   Dohmeier, Porfirio Mylar, MD  fludrocortisone (FLORINEF) 0.1 MG tablet take 0.1mg  by mouth daily in the evening 10/17/16   [provider]  memantine (NAMENDA XR) 28 MG CP24 24 hr capsule TAKE 1 CAPSULE(28 MG) BY MOUTH DAILY IN THE EVENING 03/16/18   Dohmeier, Porfirio Mylar, MD   ondansetron (ZOFRAN ODT) 4 MG disintegrating tablet Take 1 tablet (4 mg total) by mouth every 8 (eight) hours as needed for nausea or vomiting. 11/01/18   Alvira Monday, MD  vitamin B-12 (CYANOCOBALAMIN) 100 MCG tablet Take 100 mcg by mouth every evening.    [provider]    Family History Family History  Problem Relation Age of Onset  . Anemia Mother   . Alzheimer's disease Mother   . Emphysema Mother        smoker  . Obesity Sister   . Emphysema Sister        smoker  . Coronary artery disease Brother        smoker  . Heart attack Brother   . Obesity Daughter   . Depression Daughter   . Alcohol abuse Son   . Depression Son   . Suicidality Son        10/12-commited suicide in a manic depressive episode  . Colon cancer Neg Hx   . Esophageal cancer Neg Hx   . Rectal cancer Neg Hx   . Stomach cancer Neg Hx     Social History Social History   Tobacco Use  . Smoking status: Never Smoker  . Smokeless tobacco: Never Used  Substance Use Topics  . Alcohol use: Yes    Alcohol/week: 1.0 standard drinks    Types: 1 Cans of beer per week    Comment: quit- had a problem w/ alcohol and has abstained for years  . Drug use: No     Allergies   Other   Review of Systems Review of Systems  Constitutional: Positive for appetite change and fatigue. Negative for fever.  HENT: Negative for congestion.   Eyes: Negative for visual disturbance.  Respiratory: Negative for cough and shortness of breath.   Cardiovascular: Negative for chest pain.  Gastrointestinal: Negative for abdominal pain, nausea and vomiting.  Genitourinary: Negative for dysuria.  Neurological: Negative for syncope, speech difficulty, weakness, numbness and headaches.  Psychiatric/Behavioral: Positive for confusion.     Physical Exam Updated Vital Signs BP (!) 156/76   Pulse 96   Temp 98 F (36.7 C) (Oral)   Resp (!) 21   SpO2 93%   Physical Exam Vitals signs and nursing note reviewed.   Constitutional:      General: She is not in acute distress.    Appearance: She is well-developed. She is not diaphoretic.  HENT:     Head: Normocephalic and atraumatic.  Eyes:     Conjunctiva/sclera: Conjunctivae normal.  Neck:     Musculoskeletal: Normal range of motion.  Cardiovascular:     Rate and Rhythm: Normal rate and regular rhythm.     Heart sounds: Normal heart sounds. No murmur. No friction rub. No gallop.   Pulmonary:     Effort: Pulmonary effort is normal. No respiratory distress.     Breath sounds: Normal breath sounds. No wheezing or rales.  Abdominal:  General: There is no distension.     Palpations: Abdomen is soft.     Tenderness: There is no abdominal tenderness. There is no guarding.  Musculoskeletal:        General: No tenderness.  Skin:    General: Skin is warm and dry.     Findings: No erythema or rash.  Neurological:     Mental Status: She is alert.     Cranial Nerves: No dysarthria or facial asymmetry.     Sensory: Sensation is intact. No sensory deficit.     Motor: Motor function is intact. No weakness or pronator drift.     Coordination: Coordination is intact. Finger-nose-finger test: difficulty understanding directions, but appearance of normal coordination.     Comments: Oriented to self, husband      ED Treatments / Results  Labs (all labs ordered are listed, but only abnormal results are displayed) Labs Reviewed  CBC WITH DIFFERENTIAL/PLATELET - Abnormal; Notable for the following components:      Result Value   Platelets 148 (*)    Lymphs Abs 0.4 (*)    All other components within normal limits  COMPREHENSIVE METABOLIC PANEL - Abnormal; Notable for the following components:   Sodium 133 (*)    All other components within normal limits  URINALYSIS, ROUTINE W REFLEX MICROSCOPIC - Abnormal; Notable for the following components:   Ketones, ur 5 (*)    All other components within normal limits  URINE CULTURE  LACTIC ACID, PLASMA   I-STAT TROPONIN, ED    EKG EKG Interpretation  Date/Time:  Sunday November 01 2018 16:27:51 EDT Ventricular Rate:  81 PR Interval:    QRS Duration: 84 QT Interval:  493 QTC Calculation: 573 R Axis:   -3 Text Interpretation:  Sinus rhythm Ventricular premature complex Aberrant conduction of SV complex(es) Borderline low voltage, extremity leads Nonspecific T abnormalities, lateral leads Prolonged QT interval No significant change since last tracing Confirmed by ,  ([id number]) on 11/01/2018 4:32:46 PM   Radiology Dg Chest 2 View  Result Date: 11/01/2018 CLINICAL DATA:  Altered mental status. Urinary tract infection. EXAM: CHEST - 2 VIEW COMPARISON:  06/01/2017. FINDINGS: Normal sized heart. Interval minimal primarily linear density at the left lung base. Clear right lung. More prominent right diaphragmatic eventration. Mild peribronchial thickening with progression. Diffuse osteopenia. IMPRESSION: 1. Interval minimal linear atelectasis or scarring at the left lung base. 2. Mild bronchitic changes with progression. Electronically Signed   By: Steven  Reid M.D.   On: 11/01/2018 16:56    Procedures Procedures (including critical care time)  Medications Ordered in ED Medications  sodium chloride 0.9 % bolus 1,000 mL (0 mLs Intravenous Stopped 11/01/18 1904)     Initial Impression / Assessment and Plan / ED Course  I have reviewed the triage vital signs and the nursing notes.  Pertinent labs & imaging results that were available during my care of the patient were reviewed by me and considered in my medical decision making (see chart for details).       77 yo female with history of Alzheimer's disease, recent diagnosis of UTI on bactrim presents with concern for altered mental status.  She has a normal/nonfocal neurologic exam with some limitations given baseline dementia, however I do not see signs of CVA and there is no history to suggest recent trauma to suggest ICH.  EKG  shows no acute findings. Troponin negative. CXR without pneumonia or edema.  Suspect dehydration in setting of possible continuing UTI or in setting  of stomach upset from antibiotic.   Given IV fluids. Labs show no acute findings. No sign of continuing UTI. Normal lactic acid.    Possible advancement of dementia or low appetite related to bactrim.  Given rx for zofran, recommend close outpt follow up and discussed reasons to return. Patient discharged in stable condition with understanding of reasons to return.    Final Clinical Impressions(s) / ED Diagnoses   Final diagnoses:  Dehydration  Confusion  Decreased appetite    ED Discharge Orders         Ordered    ondansetron (ZOFRAN ODT) 4 MG disintegrating tablet  Every 8 hours PRN     11/01/18 2024           Alvira Monday, MD 11/02/18 [phone]

## 2018-11-01 NOTE — ED Notes (Signed)
Discharge instructions discussed with Pt, daughter and husband. Family verbalized understanding. Pt stable and leaving via WC with family.

## 2018-11-02 ENCOUNTER — Telehealth: Payer: Self-pay | Admitting: Neurology

## 2018-11-02 LAB — URINE CULTURE: Culture: NO GROWTH

## 2018-11-02 NOTE — Telephone Encounter (Signed)
Pt's husband Kim Hernandez (on [place]) states pt is having issues speaking and is weak in her legs. Pt was taken to to ED and was treated for dehydration and confusion. Husband would like to know what can be done. Please advise.

## 2018-11-02 NOTE — Telephone Encounter (Signed)
Called the husband back. Mr. Seelinger states that Mrs. Culliton has declined and is incontinent of bowel and bladder and she has been but now she has developed weakness that has caused her to have difficulty with standing up. Mr. Mo is unable to care for her by himself with the decrease in her ability to help assist him with standing. Dr Dohmeier advised that the patient reach out to his PCP to address this with them and see if they have capability/resources to assist the patient with getting set up with [facility] services to assess if she is a candidate for in patient services. I have informed the husband to call back if I can help in any way.

## 2018-11-04 ENCOUNTER — Ambulatory Visit: Payer: Medicare Other | Admitting: Podiatry

## 2018-11-04 DIAGNOSIS — K219 Gastro-esophageal reflux disease without esophagitis: Secondary | ICD-10-CM | POA: Diagnosis not present

## 2018-11-04 DIAGNOSIS — M810 Age-related osteoporosis without current pathological fracture: Secondary | ICD-10-CM | POA: Diagnosis not present

## 2018-11-04 DIAGNOSIS — E538 Deficiency of other specified B group vitamins: Secondary | ICD-10-CM | POA: Diagnosis not present

## 2018-11-04 DIAGNOSIS — F0280 Dementia in other diseases classified elsewhere without behavioral disturbance: Secondary | ICD-10-CM | POA: Diagnosis not present

## 2018-11-04 DIAGNOSIS — E7800 Pure hypercholesterolemia, unspecified: Secondary | ICD-10-CM | POA: Diagnosis not present

## 2018-11-04 DIAGNOSIS — F321 Major depressive disorder, single episode, moderate: Secondary | ICD-10-CM | POA: Diagnosis not present

## 2018-11-04 DIAGNOSIS — N390 Urinary tract infection, site not specified: Secondary | ICD-10-CM | POA: Diagnosis not present

## 2018-11-04 DIAGNOSIS — E876 Hypokalemia: Secondary | ICD-10-CM | POA: Diagnosis not present

## 2018-11-04 DIAGNOSIS — D692 Other nonthrombocytopenic purpura: Secondary | ICD-10-CM | POA: Diagnosis not present

## 2018-11-04 DIAGNOSIS — G309 Alzheimer's disease, unspecified: Secondary | ICD-10-CM | POA: Diagnosis not present

## 2019-01-06 IMAGING — DX DG CHEST 2V
2 series · 2 of 2 positions shown · non-contrast
Comparison: None.

CLINICAL DATA: Fall.  Right-sided chest pain.  Initial encounter.

EXAM:
CHEST  2 VIEW

[w chest pa]
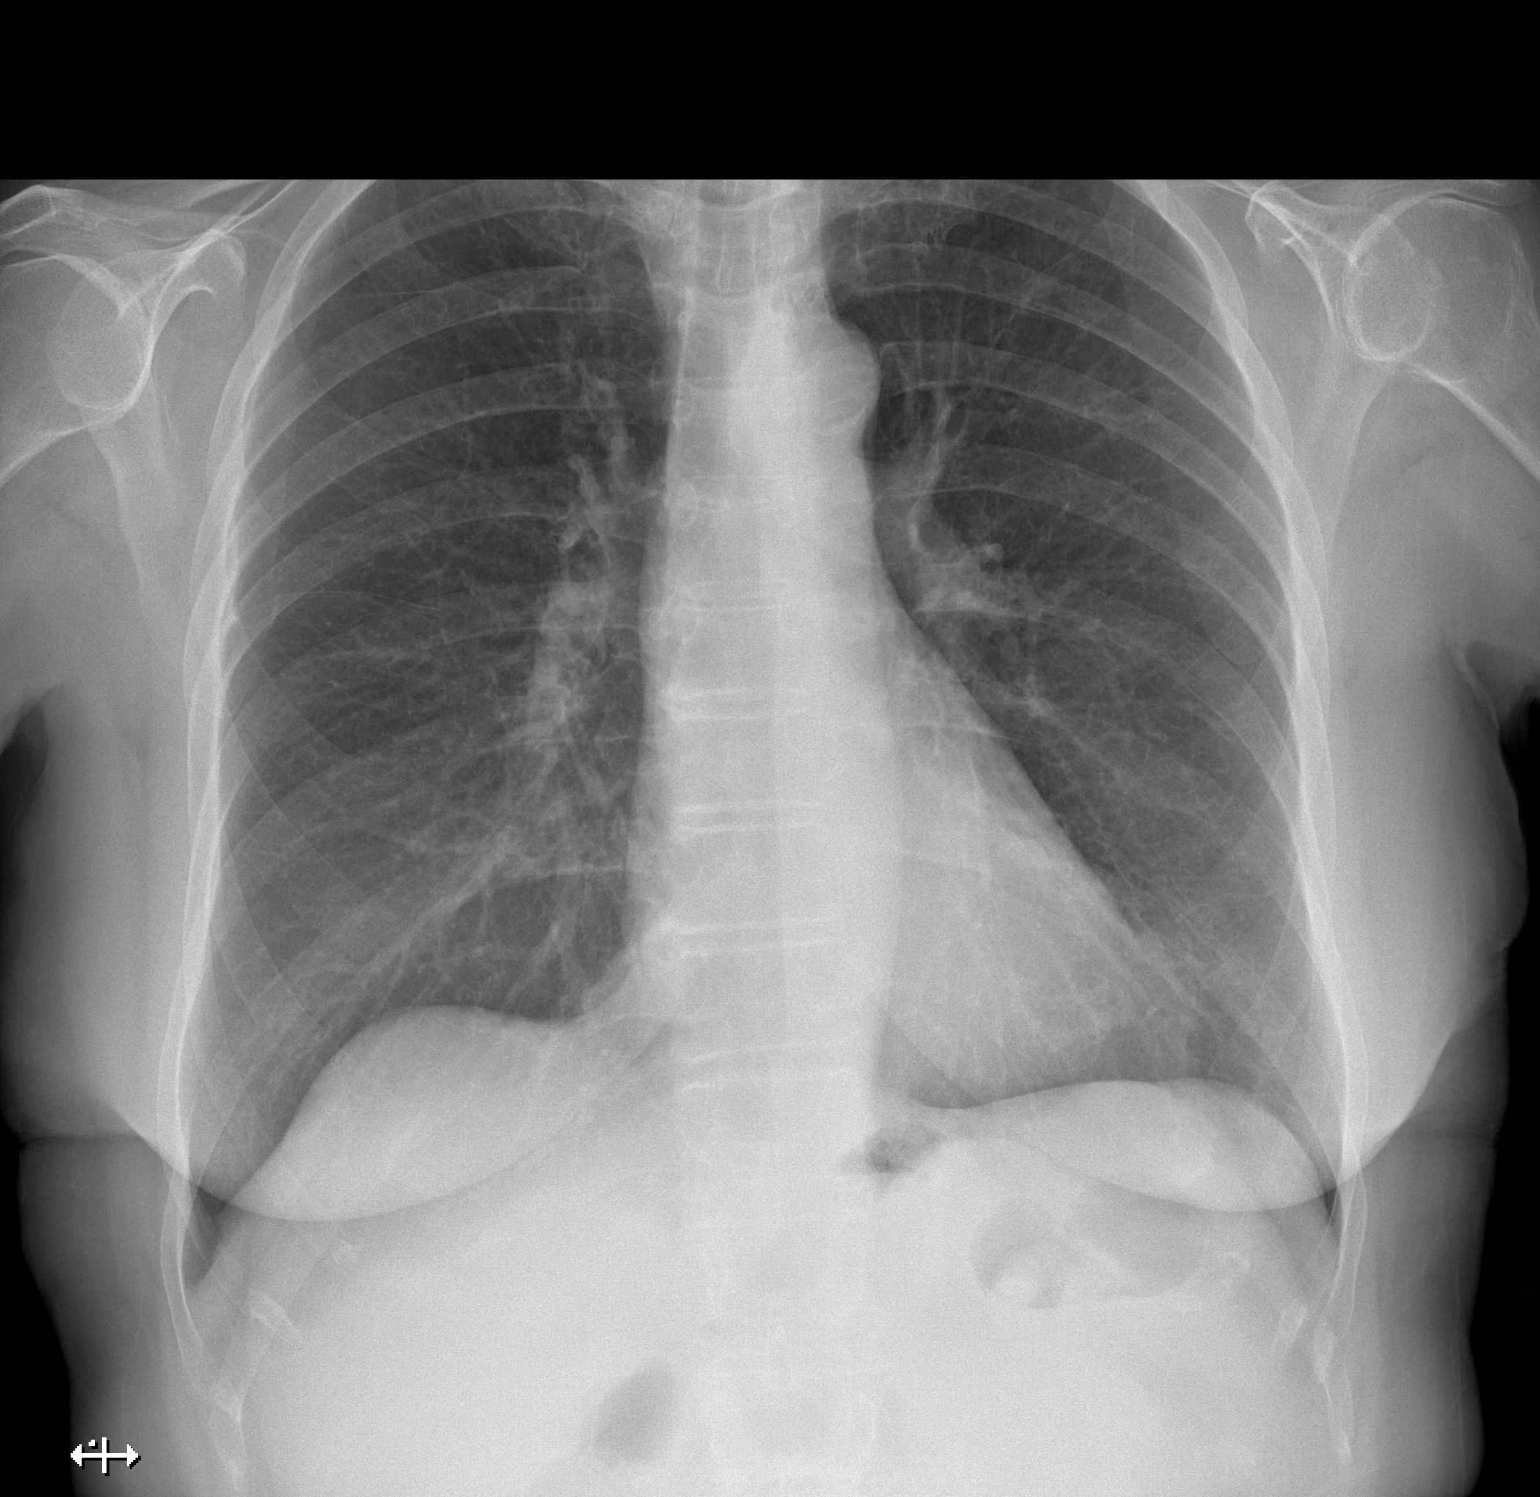

[w chest lat]
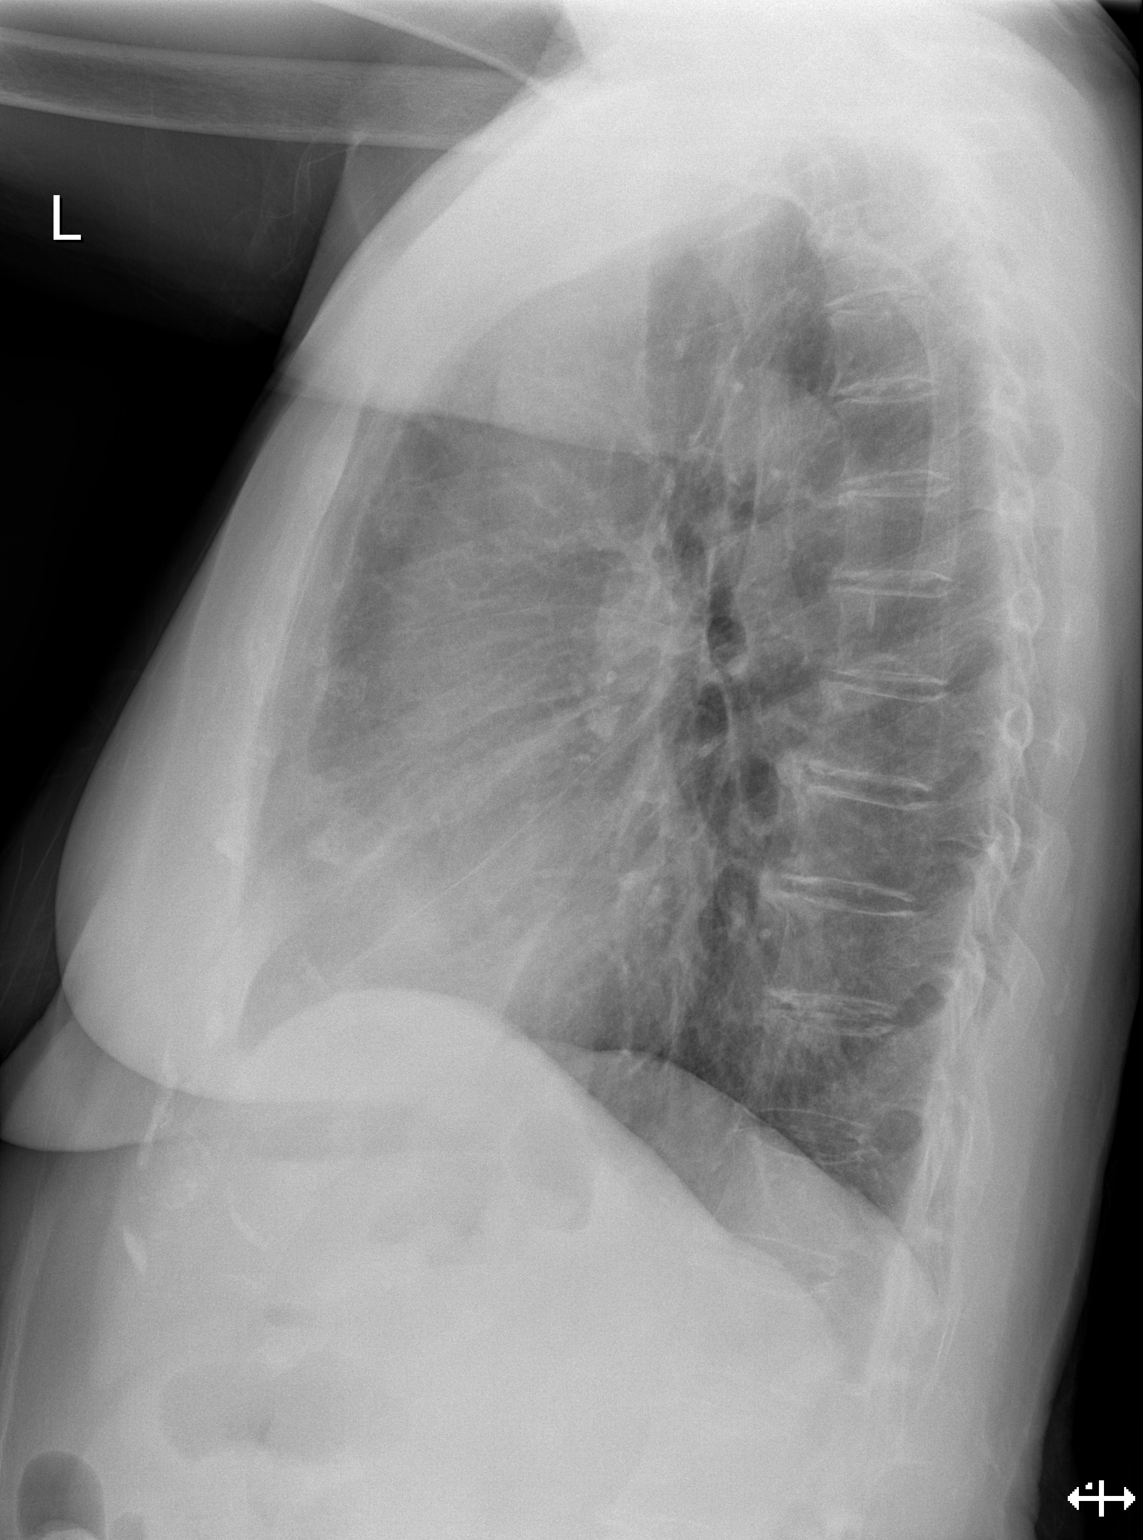

[2 of 2 positions shown; findings below may reference images not displayed]

FINDINGS: The heart size and mediastinal contours are within normal limits.
There is no evidence of pulmonary edema, consolidation,
pneumothorax, nodule or pleural fluid. There may be a subtle lateral
right-sided rib fracture roughly at the level of the sixth rib.
Oblique right rib films may be helpful.
IMPRESSION: Possible subtle right-sided rib fracture around the level of the
right lateral sixth rib. No evidence of pneumothorax or other acute
pulmonary findings.

## 2019-02-10 ENCOUNTER — Encounter: Payer: Self-pay | Admitting: Neurology

## 2019-03-25 ENCOUNTER — Other Ambulatory Visit: Payer: Self-pay | Admitting: Neurology

## 2019-04-22 ENCOUNTER — Ambulatory Visit: Payer: Medicare Other | Admitting: Adult Health

## 2019-05-05 DIAGNOSIS — F321 Major depressive disorder, single episode, moderate: Secondary | ICD-10-CM | POA: Diagnosis not present

## 2019-05-05 DIAGNOSIS — K219 Gastro-esophageal reflux disease without esophagitis: Secondary | ICD-10-CM | POA: Diagnosis not present

## 2019-05-05 DIAGNOSIS — E7800 Pure hypercholesterolemia, unspecified: Secondary | ICD-10-CM | POA: Diagnosis not present

## 2019-05-05 DIAGNOSIS — E538 Deficiency of other specified B group vitamins: Secondary | ICD-10-CM | POA: Diagnosis not present

## 2019-05-05 DIAGNOSIS — F0390 Unspecified dementia without behavioral disturbance: Secondary | ICD-10-CM | POA: Diagnosis not present

## 2019-06-23 DIAGNOSIS — H524 Presbyopia: Secondary | ICD-10-CM | POA: Diagnosis not present

## 2019-07-28 DIAGNOSIS — M6281 Muscle weakness (generalized): Secondary | ICD-10-CM | POA: Diagnosis not present

## 2019-07-28 DIAGNOSIS — G301 Alzheimer's disease with late onset: Secondary | ICD-10-CM | POA: Diagnosis not present

## 2019-07-28 DIAGNOSIS — R627 Adult failure to thrive: Secondary | ICD-10-CM | POA: Diagnosis not present

## 2019-07-28 DIAGNOSIS — R2689 Other abnormalities of gait and mobility: Secondary | ICD-10-CM | POA: Diagnosis not present

## 2019-08-04 ENCOUNTER — Telehealth: Payer: Self-pay

## 2019-08-04 NOTE — Telephone Encounter (Signed)
Received update that patient was admitted to hospice services. Will close out Palliative Care referral.

## 2019-08-20 DEATH — deceased

## 2019-09-11 IMAGING — CT CT ABD-PELV W/ CM
2 of 5 series · 15 of 46 positions shown, 17 images · IV contrast (iopamidol)
Comparison: 07/08/2017

CLINICAL DATA: Right lower quadrant pain and nausea for several
hours.

EXAM:
CT ABDOMEN AND PELVIS WITH CONTRAST
TECHNIQUE: Multidetector CT imaging of the abdomen and pelvis was performed
using the standard protocol following bolus administration of
intravenous contrast.
CONTRAST:  100mL B6HKFE-DYY IOPAMIDOL (B6HKFE-DYY) INJECTION 61%

[Series 3: axial st · axial · 0.68mm/px · z∈[-693,-308]mm · 12 of 89 slices shown, 14 images]
[im 6/89  soft-tissue]
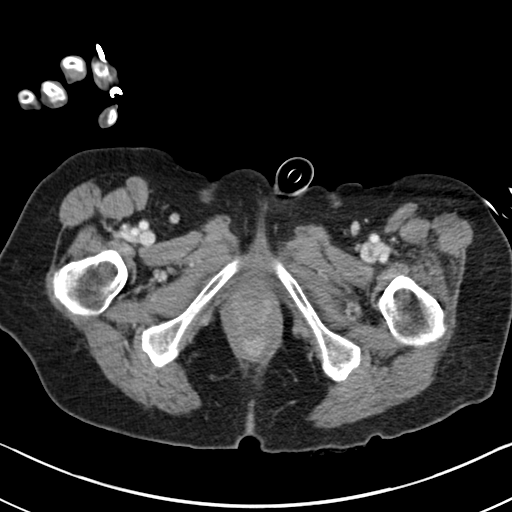
[im 6/89  bone]
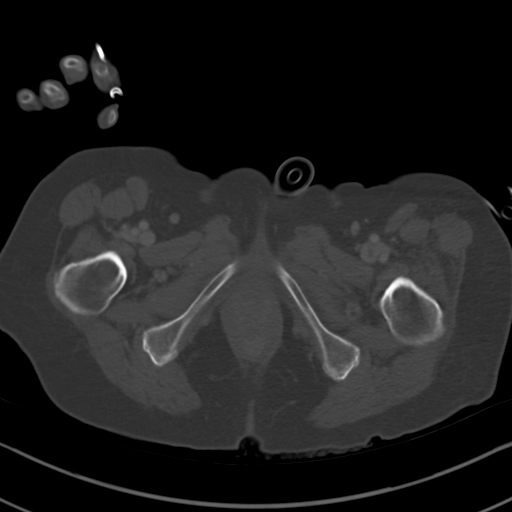
[im 16/89  soft-tissue]
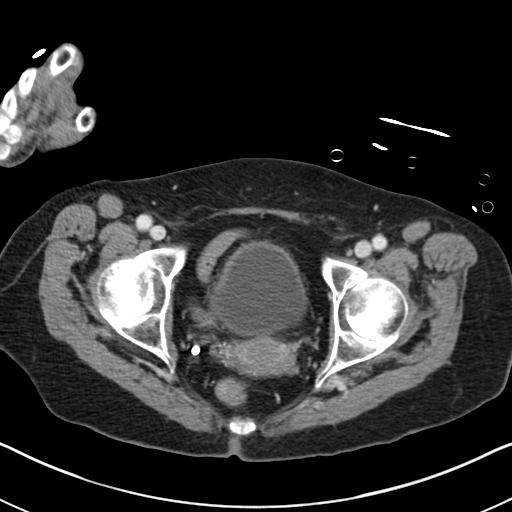
[im 21/89  soft-tissue]
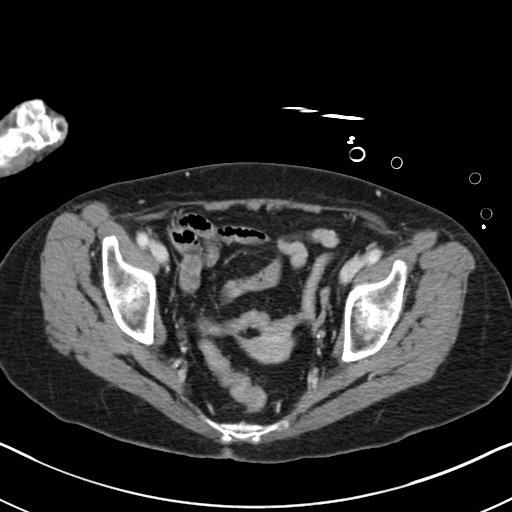
[im 26/89  soft-tissue]
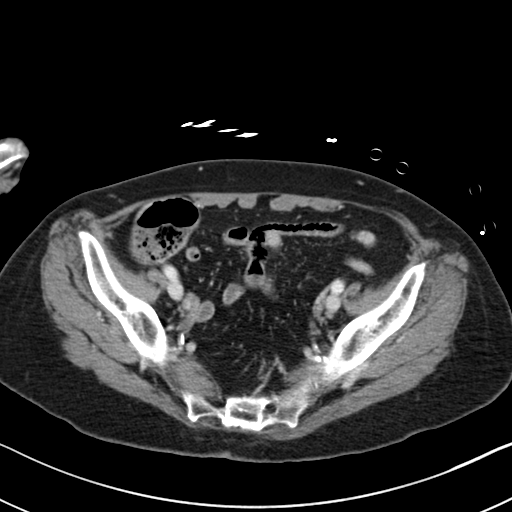
[im 37/89  soft-tissue]
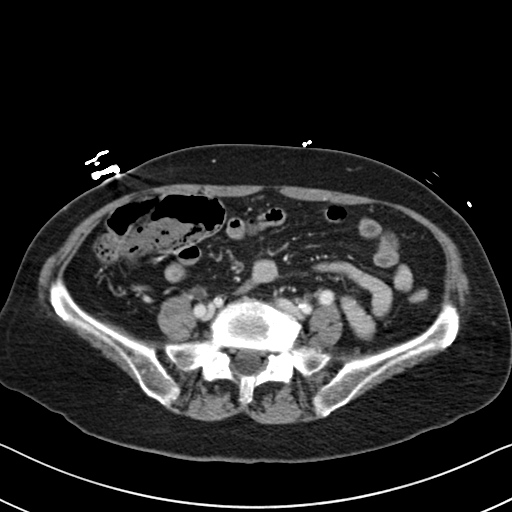
[im 42/89  soft-tissue]
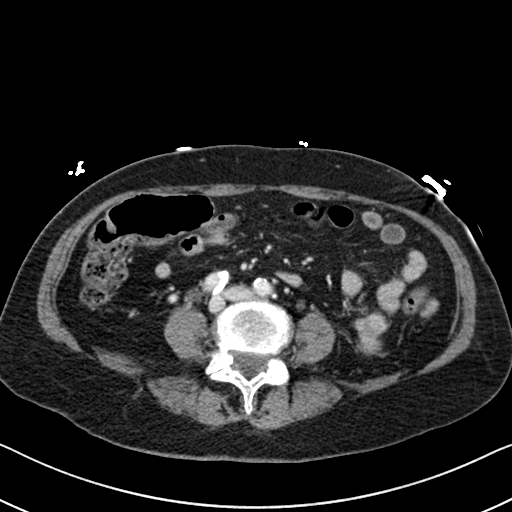
[im 47/89  soft-tissue]
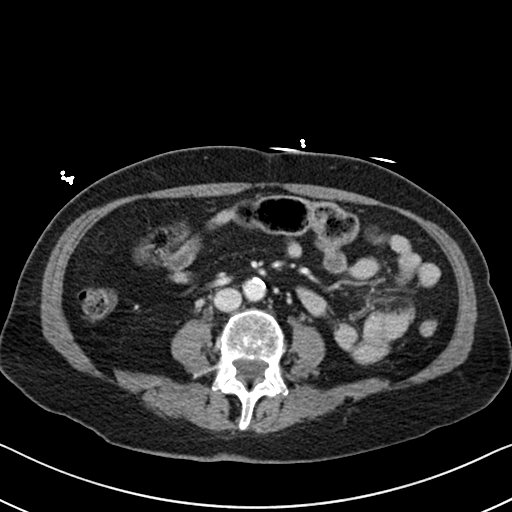
[im 57/89  soft-tissue]
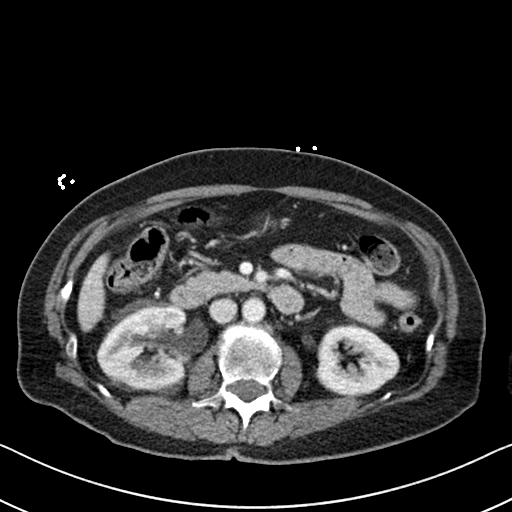
[im 63/89  soft-tissue]
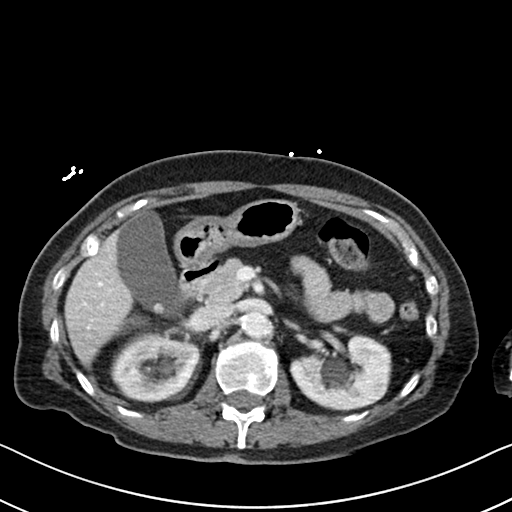
[im 63/89  bone]
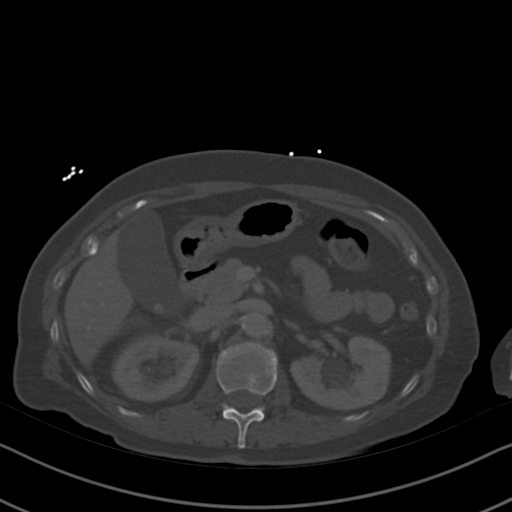
[im 68/89  soft-tissue]
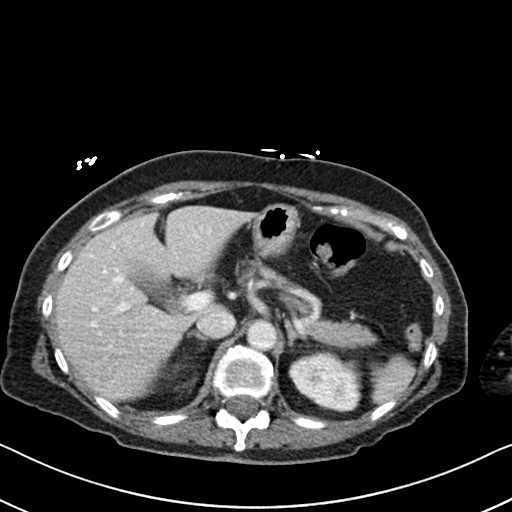
[im 78/89  soft-tissue]
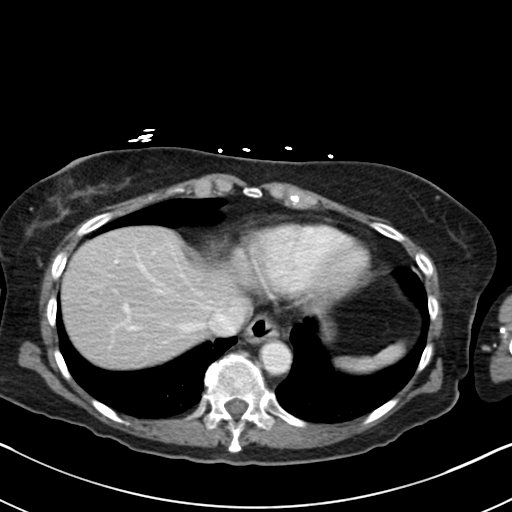
[im 83/89  soft-tissue]
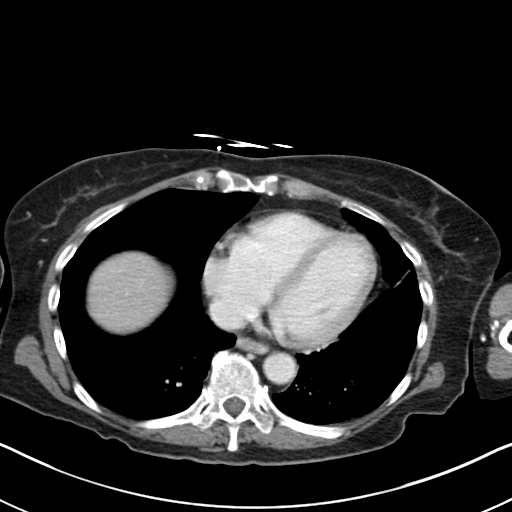

[Series 5: coronal st · coronal · 0.62mm/px · 3 of 69 slices shown]
[im 23/69  soft-tissue]
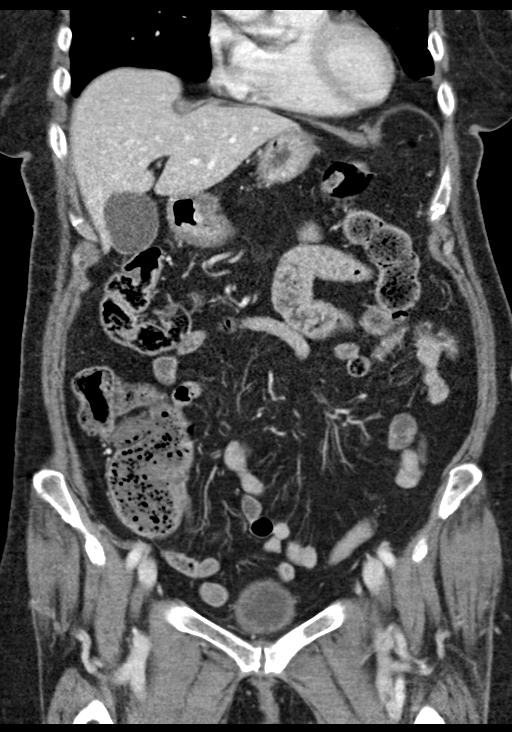
[im 31/69  soft-tissue]
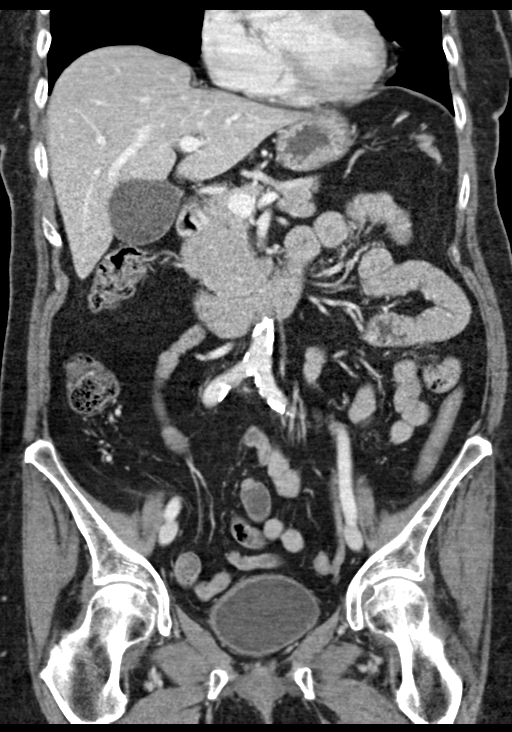
[im 38/69  soft-tissue]
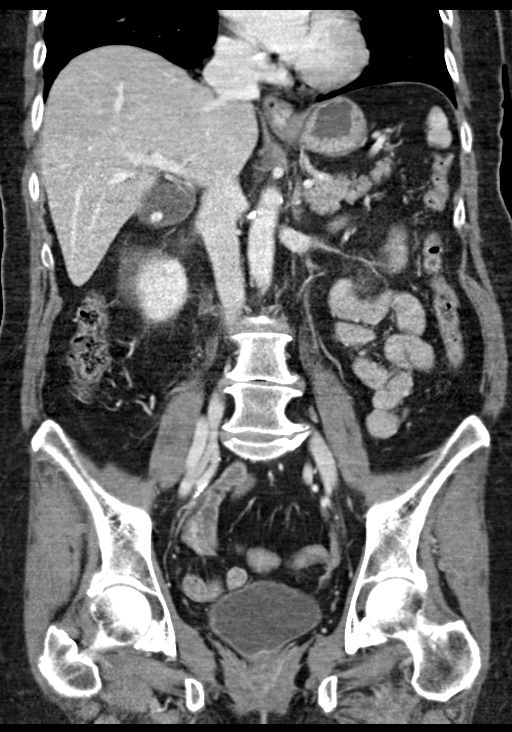

[15 of 46 positions shown; findings below may reference images not displayed]

FINDINGS: Lower chest: Atelectasis or fibrosis in the lung bases.

Hepatobiliary: Cholelithiasis. No gallbladder wall thickening or
inflammatory changes. No bile duct dilatation. No focal liver
lesions.

Pancreas: Unremarkable. No pancreatic ductal dilatation or
surrounding inflammatory changes.

Spleen: Normal in size without focal abnormality.

Adrenals/Urinary Tract: No adrenal gland nodules. 3 mm stone in the
right ureterovesical junction with right hydronephrosis and
hydroureter. Edema around the right kidney and ureter. No bladder
wall thickening. Left kidney and ureter are unremarkable except for
pallor pelvic cysts.

Stomach/Bowel: Stomach is within normal limits. Appendix is not
identified. No evidence of bowel wall thickening, distention, or
inflammatory changes.

Vascular/Lymphatic: Aortic atherosclerosis. No enlarged abdominal or
pelvic lymph nodes.

Reproductive: Uterus and bilateral adnexa are unremarkable.

Other: No abdominal wall hernia or abnormality. No abdominopelvic
ascites.

Musculoskeletal: Degenerative changes in the spine and hips. No
destructive bone lesions.
IMPRESSION: 1. 3 mm stone in the right ureterovesical junction with moderate
proximal obstruction.
2. Cholelithiasis without evidence of acute cholecystitis.
3. Appendix is not identified but no inflammatory changes are seen
in the right lower quadrant.

## 2020-10-03 ENCOUNTER — Encounter: Payer: Self-pay | Admitting: Gastroenterology
